# Patient Record
Sex: Male | Born: 1995 | Race: Black or African American | Hispanic: No | Marital: Single | Smoking: Current every day smoker
Health system: Southern US, Community
[De-identification: ages and names within clinical notes are randomized; demographics above are authoritative.]

## PROBLEM LIST (undated history)

## (undated) ENCOUNTER — Ambulatory Visit: Admission: EM | Payer: MEDICAID

## (undated) DIAGNOSIS — L0291 Cutaneous abscess, unspecified: Secondary | ICD-10-CM

## (undated) DIAGNOSIS — F1290 Cannabis use, unspecified, uncomplicated: Secondary | ICD-10-CM

## (undated) DIAGNOSIS — R112 Nausea with vomiting, unspecified: Secondary | ICD-10-CM

## (undated) HISTORY — PX: TONSILLECTOMY: SUR1361

---
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
## 2013-07-23 ENCOUNTER — Emergency Department (INDEPENDENT_AMBULATORY_CARE_PROVIDER_SITE_OTHER)
Admission: EM | Admit: 2013-07-23 | Discharge: 2013-07-23 | Disposition: A | Discharge status: Home / Self Care | Payer: Medicaid Other | Source: Home / Self Care

## 2013-07-23 ENCOUNTER — Encounter (HOSPITAL_COMMUNITY): Payer: Self-pay | Admitting: Emergency Medicine

## 2013-07-23 DIAGNOSIS — N492 Inflammatory disorders of scrotum: Secondary | ICD-10-CM

## 2013-07-23 DIAGNOSIS — N498 Inflammatory disorders of other specified male genital organs: Secondary | ICD-10-CM

## 2013-07-23 MED ORDER — SULFAMETHOXAZOLE-TRIMETHOPRIM 800-160 MG PO TABS: 1.0000 | ORAL_TABLET | Freq: Two times a day (BID) | ORAL | Status: AC

## 2013-07-23 MED ORDER — HYDROCODONE-ACETAMINOPHEN 5-325 MG PO TABS: 1.0000 | ORAL_TABLET | ORAL | Status: DC | PRN

## 2013-07-23 NOTE — ED Notes (Signed)
Groin abscess, right side that patient noticed one week ago.

## 2013-07-23 NOTE — ED Provider Notes (Signed)
Medical screening examination/treatment/procedure(s) were performed by non-physician practitioner and as supervising physician I was immediately available for consultation/collaboration.  Adrielle Polakowski, M.D.  Werner Labella C Hallis Meditz, MD 07/23/13 2217 

## 2013-07-23 NOTE — ED Provider Notes (Signed)
CSN: [id number]     Arrival date & time 07/23/13  1244 History   First MD Initiated Contact with Patient 07/23/13 1540     Chief Complaint  Patient presents with  . Abscess   (Consider location/radiation/quality/duration/timing/severity/associated sxs/prior Treatment) HPI Comments: 17 year old male complaining of an abscess to the right groin for one week. States it is painful, tender and recurrent. He said similar abscess formation similar area in the remote past.   History reviewed. No pertinent past medical history. Past Surgical History  Procedure Laterality Date  . Tonsillectomy     History reviewed. No pertinent family history. History  Substance Use Topics  . Smoking status: Current Every Day Smoker  . Smokeless tobacco: Not on file  . Alcohol Use: No    Review of Systems  Constitutional: Positive for activity change. Negative for fever, chills and fatigue.  HENT: Negative.   Respiratory: Negative.   Gastrointestinal: Negative.   Genitourinary: Positive for scrotal swelling. Negative for dysuria, urgency, frequency, flank pain, discharge, penile swelling, difficulty urinating, penile pain and testicular pain.  Neurological: Negative.     Allergies  Review of patient's allergies indicates no known allergies.  Home Medications   Current Outpatient Rx  Name  Route  Sig  Dispense  Refill  . HYDROcodone-acetaminophen (NORCO/VICODIN) 5-325 MG per tablet   Oral   Take 1 tablet by mouth every 4 (four) hours as needed.   15 tablet   0   . sulfamethoxazole-trimethoprim (BACTRIM DS,SEPTRA DS) 800-160 MG per tablet   Oral   Take 1 tablet by mouth 2 (two) times daily.   14 tablet   0    There were no vitals taken for this visit. Physical Exam  Nursing note and vitals reviewed. Constitutional: He appears well-developed and well-nourished. No distress.  Neck: Normal range of motion. Neck supple.  Pulmonary/Chest: Effort normal. No respiratory distress.   Genitourinary:  There is a cystic, fluctuant, well marginated raised lesion over the right scrotum. It palpates similar to a water balloon. No surrounding induration or erythema. It does not extend into the remainder of the scrotum. There is no testicular enlargement or tenderness. There is no swelling within the scrotum. No pathology tenderness or pain of the penis.  Neurological: He is alert. He exhibits normal muscle tone.  Skin: Skin is warm and dry.  Psychiatric: He has a normal mood and affect.    ED Course  INCISION AND DRAINAGE Date/Time: 07/23/2013 4:50 PM Performed by: Phineas Real, Fonda Rochon Authorized by: Leslee Home C Consent: Verbal consent obtained. Risks and benefits: risks, benefits and alternatives were discussed Consent given by: patient Patient understanding: patient states understanding of the procedure being performed Patient identity confirmed: verbally with patient Type: abscess Body area: anogenital Location details: scrotal wall Anesthesia: local infiltration Local anesthetic: lidocaine 2% with epinephrine Anesthetic total: 10 ml Patient sedated: no Scalpel size: 11 Incision type: single straight Complexity: complex Drainage: purulent Drainage amount: copious Wound treatment: drain placed Packing material: 1/4 in gauze Patient tolerance: Patient tolerated the procedure well with no immediate complications. Comments: Covered with 4 x 4's dressing. The lesion was hollow/cavernous and no marsupialization required.    (including critical care time) Labs Review Labs Reviewed  CULTURE, ROUTINE-ABSCESS   Imaging Review No results found.    MDM   1. Abscess of scrotum      IND the infected cyst Norco for pain #15 Septra DS twice a day for 7 days Followup in 2 days for packing removal and wound  check Return  for new symptoms or problems  Hayden Rasmussen, NP 07/23/13 [phone]

## 2013-07-26 ENCOUNTER — Telehealth (HOSPITAL_COMMUNITY): Payer: Self-pay | Admitting: *Deleted

## 2013-07-26 ENCOUNTER — Encounter (HOSPITAL_COMMUNITY): Payer: Self-pay | Admitting: Emergency Medicine

## 2013-07-26 ENCOUNTER — Emergency Department (INDEPENDENT_AMBULATORY_CARE_PROVIDER_SITE_OTHER)
Admission: EM | Admit: 2013-07-26 | Discharge: 2013-07-26 | Disposition: A | Discharge status: Home / Self Care | Payer: Medicaid Other | Source: Home / Self Care | Attending: Emergency Medicine | Admitting: Emergency Medicine

## 2013-07-26 DIAGNOSIS — L732 Hidradenitis suppurativa: Secondary | ICD-10-CM

## 2013-07-26 HISTORY — DX: Cutaneous abscess, unspecified: L02.91

## 2013-07-26 LAB — CULTURE, ROUTINE-ABSCESS

## 2013-07-26 MED ORDER — CLINDAMYCIN PHOSPHATE 1 % EX SOLN: Freq: Two times a day (BID) | CUTANEOUS | Status: DC

## 2013-07-26 MED ORDER — CEPHALEXIN 500 MG PO CAPS: 500.0000 mg | ORAL_CAPSULE | Freq: Three times a day (TID) | ORAL | Status: DC

## 2013-07-26 MED ORDER — MUPIROCIN 2 % EX OINT: 1.0000 "application " | TOPICAL_OINTMENT | Freq: Three times a day (TID) | CUTANEOUS | Status: DC

## 2013-07-26 NOTE — ED Notes (Signed)
Follow up boil, seen 12/26 for this .

## 2013-07-27 NOTE — ED Provider Notes (Addendum)
Chief Complaint:   Chief Complaint  Patient presents with  . Wound Check    History of Present Illness:    Jose Mays is a 17 year old male who returns today for a recheck on an abscess in his groin and packing removal. This was incised and drained 2 days ago. A culture was taken, but at the time of this visit the culture was not back yet. The patient states the abscess feels better although it still somewhat painful. He has multiple other tender nodules in his groin area. He denies any nodules in his axillas. He states he's had recurring boils in the pubic area and on the scrotum for years.  Review of Systems:  Other than noted above, the patient denies any of the following symptoms: Systemic:  No fever, chills or sweats. Skin:  No rash or itching.  PMFSH:  Past medical history, family history, social history, meds, and allergies were reviewed.  No history of diabetes or prior history of abscesses or MRSA.  Physical Exam:   Vital signs:  BP 101/47  Pulse 69  Temp(Src) 97.6 F (36.4 C) (Oral)  Resp 16  SpO2 100% Skin:  There is abscess in the scrotum. This is been packed there still some surrounding induration.  He has multiple, smaller nodules in the pubic area. None of these were fluctuant or draining. Skin exam was otherwise normal.  No rash. Ext:  Distal pulses were full, patient has full ROM of all joints.  Procedure:  Verbal informed consent was obtained.  The patient was informed of the risks and benefits of the procedure and understands and accepts.  Identity of the patient was verified verbally and by wristband. The packing was removed and a small amount of pus was expressed. The area was washed with saline, antibiotic ointment was applied, and a clean, dry dressing.  Assessment:  The encounter diagnosis was Hidradenitis suppurativa.  He appears to have hidradenitis suppurativa. Will need followup with a dermatologist.  Plan:   1.  Meds:  The following meds were  prescribed:   Discharge Medication List as of 07/26/2013  8:55 AM    START taking these medications   Details  clindamycin (CLEOCIN-T) 1 % external solution Apply topically 2 (two) times daily., Starting 07/26/2013, Until Discontinued, Normal    mupirocin ointment (BACTROBAN) 2 % Apply 1 application topically 3 (three) times daily., Starting 07/26/2013, Until Discontinued, Normal        2.  Patient Education/Counseling:  The patient was given appropriate handouts, self care instructions, and instructed in symptomatic relief.  Suggested twice-weekly Clorox baths and application mupirocin ointment to the nostrils.  3.  Follow up:  The patient was instructed to leave the dressing in place and return again in 48 hours for packing removal, if becoming worse in any way, and given some red flag symptoms such as increasing pain or swelling of any other nodules which would prompt immediate return.  Follow up here for I&D of any future nodules and with Dr. Para Skeans for routine followup on his hidradenitis suppurativa.  Addendum: Later on that afternoon the culture came back showing a few strep group B. The patient was called and informed of this. He was told not to take any further of the Septra, and a prescription was sent in for cephalexin 500 mg twice a day for 10 days.     Reuben Likes, MD 07/27/13 [phone]  Addendum: The culture showed group C strep, not group B.  Reuben Likes,  MD 07/27/13 1447

## 2013-07-27 NOTE — ED Notes (Addendum)
12/29  Abscess culture R scrotum: Rare Group C strep-no sensitivity.  Treated with Bactrim DS.  Discussed with Dr. Lorenz Coaster. He said he was going to change him to Keflex.  I called pt. and left a message to call.  Call 1. 12/30 I called and left message with pt.'s mother for him to call.  Call 2. Jose Mays 07/27/2013  Pt. called back.  Pt. verified x 2 and given results.  Pt. told to stop the Bactrim DS and take all of the Keflex. Pt. told where to pick up Rx. Pt. instructed to come back if not better after finishing the antibiotics or worsening in anyway. Pt. voiced understanding.

## 2014-03-10 ENCOUNTER — Emergency Department: Payer: Self-pay | Admitting: Emergency Medicine

## 2014-03-17 ENCOUNTER — Emergency Department: Payer: Self-pay | Admitting: Emergency Medicine

## 2014-03-17 LAB — COMPREHENSIVE METABOLIC PANEL
ALBUMIN: 4.4 g/dL (ref 3.8–5.6)
ALT: 18 U/L
Alkaline Phosphatase: 83 U/L
Anion Gap: 13 (ref 7–16)
BUN: 14 mg/dL (ref 7–18)
Bilirubin,Total: 0.6 mg/dL (ref 0.2–1.0)
CALCIUM: 8.9 mg/dL — AB (ref 9.0–10.7)
CO2: 25 mmol/L (ref 21–32)
Chloride: 104 mmol/L (ref 98–107)
Creatinine: 1.4 mg/dL — ABNORMAL HIGH (ref 0.60–1.30)
GLUCOSE: 171 mg/dL — AB (ref 65–99)
OSMOLALITY: 288 (ref 275–301)
Potassium: 3 mmol/L — ABNORMAL LOW (ref 3.5–5.1)
SGOT(AST): 19 U/L (ref 10–41)
SODIUM: 142 mmol/L (ref 136–145)
Total Protein: 8 g/dL (ref 6.4–8.6)

## 2014-03-17 LAB — CBC
HCT: 46.4 % (ref 40.0–52.0)
HGB: 15.7 g/dL (ref 13.0–18.0)
MCH: 32 pg (ref 26.0–34.0)
MCHC: 33.9 g/dL (ref 32.0–36.0)
MCV: 94 fL (ref 80–100)
PLATELETS: 339 10*3/uL (ref 150–440)
RBC: 4.91 10*6/uL (ref 4.40–5.90)
RDW: 12.7 % (ref 11.5–14.5)
WBC: 9.5 10*3/uL (ref 3.8–10.6)

## 2014-03-17 LAB — LIPASE, BLOOD: LIPASE: 86 U/L (ref 73–393)

## 2014-06-28 ENCOUNTER — Emergency Department: Payer: Self-pay | Admitting: Emergency Medicine

## 2014-10-04 ENCOUNTER — Emergency Department: Payer: Self-pay | Admitting: Emergency Medicine

## 2014-10-13 ENCOUNTER — Emergency Department: Payer: Self-pay | Admitting: Emergency Medicine

## 2014-10-14 ENCOUNTER — Inpatient Hospital Stay: Payer: Self-pay | Admitting: Psychiatry
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
## 2014-11-27 NOTE — H&P (Signed)
PATIENT NAME:  Jose Mays, Jose Mays MR#:  [id number] DATE OF BIRTH:  28-Feb-1996  DATE OF ADMISSION:  10/13/2014  IDENTIFYING INFORMATION: The patient is a 19 year old African American male with no known past history who presented to the Emergency Room by the law enforcement due to agitation and paranoia in the parking lot.   CHIEF COMPLAINT: "I was hearing things."   HISTORY OF PRESENT ILLNESS:   The patient is a 19 year old African American male who was evaluated for paranoia. Most of the information was obtained from the patient, as well as from the chart. During the interview, the patient reported that he came to the Emergency Room as his stomach was hurting. He reported that he was unable to sleep and was walking around  for the whole night. He went to sleep around 6:30 in the morning and then he went to work. He stated that his head was hurting as well. After that he ate some potato and he started throwing up. He went to his grandmother's house and he threw up again. He stated that his grandma asked him to come to the hospital as he was not feeling. When he presented to the ER, he could not understand what was going on and what he was saying. He smokes marijuana  before coming to the hospital. The patient reported that he was having auditory and visual hallucinations at this time and was unable to provide a coherent history. The patient was admitted for paranoia. He stated that he also has a court date coming up which he did not provide information to the ER physician. The patient reported that he has been having auditory hallucinations at the time of admission but is not experiencing the same at this time. He is responding well to the medication. The patient currently denied having any suicidal ideations or plans.   PAST PSYCHIATRIC HISTORY: The patient reported that he has seen some kind of mental health provider when he was young but is not taking any current psychotropic medication. He does not  have  any history of prior psychiatric admission or suicide attempts.   SOCIAL HISTORY: The patient currently lives with his mother and older brother. He works as a Public affairs consultantdishwasher. He stated that he is on probation for marijuana  possession. He denied having any  children and  has  never been married in the past.   PAST MEDICAL HISTORY: The patient reported that he has boils in his which might be related to his marijuana use.   SUBSTANCE ABUSE HISTORY: The patient uses marijuana on a daily basis for the past 10 years. He reported that he is trying to quit the use of the same. He denied using any alcohol.   FAMILY HISTORY: He denied any history of psychiatric illness in his family.   CURRENT MEDICATIONS: He reported that he uses Percocet off and on for the boils in his groin.   ALLERGIES: No known drug allergies.   REVIEW OF SYSTEMS:    CONSTITUTIONAL:   The patient denies any fever or chills. No weight changes.  EYES: No double or blurred vision.  RESPIRATORY:   No shortness of breath or cough.  CARDIOVASCULAR: No chest pain or orthopnea.  GASTROINTESTINAL: No abdominal pain, nausea, vomiting or diarrhea.  GENITOURINARY: No incontinence or frequency.  ENDOCRINE: No heat or cold intolerance.  LYMPHATIC: No anemia or easy bruising.  INTEGUMENTARY: No skin  rash.  MUSCULOSKELETAL: No muscle or joint pain.  NEUROLOGIC: No tingling or weakness.  VITAL SIGNS: Temperature  98.3, pulse 81, respirations 18, blood pressure 130/77.   LABORATORY:  Blood glucose 117, BUN 14, creatinine 1.02, sodium 139, potassium 3.7, chloride 102, bicarbonate 30, anion gap 7, calcium 9.3, protein 8.2, albumin 4.4, bilirubin 0.3,AST 36, ALT 17. UDS is positive for cannabinoids. WBC 10.8, RBC 4.56, hemoglobin 14.3, hematocrit 43.9, platelet count 426,000, MCV is 96. RDW is 12.2.   MENTAL STATUS EXAMINATION: The patient is a moderately built male who appeared his stated age. He was calm and cooperative. He maintained fair eye  contact. His speech was low in tone and volume. Mood was anxious. Affect was congruent. Thought process was logical, goal-directed. Thought content was nondelusional.   He currently denied having any auditory or visual hallucinations. He denied having any suicidal ideations or plans. His memory seems appropriate and his insight and judgment were fair. Fund of knowledge seems normal.   DIAGNOSTIC IMPRESSION: AXIS I:    1.  Psychotic disorder, not otherwise specified.                    2.  Cannabis use disorder, moderate.  AXIS II:  None.  AXIS III: None reported.   TREATMENT PLAN:   1. The patient will be admitted to the inpatient behavioral health unit for stabilization and safety.  2. He was started on Risperdal  1 mg p.o. b.i.d.  He is also getting Keflex 500 mg every 6 hours for 10 days.    3. He will be monitored  closely by the staff.   Thank you for allowing me to participate in the care of this patient.    ____________________________ Ardeen Fillers. Garnetta Buddy, MD usf:tr D: 10/15/2014 12:06:15 ET T: 10/15/2014 12:27:31 ET JOB#: [id number]  cc: Ardeen Fillers. Garnetta Buddy, MD, <Dictator> Rhunette Croft MD ELECTRONICALLY SIGNED 10/16/2014 12:10

## 2014-12-05 ENCOUNTER — Emergency Department (INDEPENDENT_AMBULATORY_CARE_PROVIDER_SITE_OTHER)
Admission: EM | Admit: 2014-12-05 | Discharge: 2014-12-05 | Disposition: A | Discharge status: Home / Self Care | Payer: Self-pay | Source: Home / Self Care | Attending: Family Medicine | Admitting: Family Medicine

## 2014-12-05 ENCOUNTER — Encounter (HOSPITAL_COMMUNITY): Payer: Self-pay | Admitting: Emergency Medicine

## 2014-12-05 DIAGNOSIS — L739 Follicular disorder, unspecified: Secondary | ICD-10-CM

## 2014-12-05 DIAGNOSIS — A630 Anogenital (venereal) warts: Secondary | ICD-10-CM

## 2014-12-05 MED ORDER — PODOFILOX 0.5 % EX GEL: CUTANEOUS | Status: DC

## 2014-12-05 MED ORDER — DOXYCYCLINE HYCLATE 100 MG PO CAPS: 100.0000 mg | ORAL_CAPSULE | Freq: Two times a day (BID) | ORAL | Status: DC

## 2014-12-05 NOTE — ED Provider Notes (Signed)
CSN: [id number]     Arrival date & time 12/05/14  1530 History   First MD Initiated Contact with Patient 12/05/14 1621     Chief Complaint  Patient presents with  . Genital Warts   (Consider location/radiation/quality/duration/timing/severity/associated sxs/prior Treatment) HPI Comments: 19 year old male complains of having papular, flesh-colored lesions to the penis as well as the inner thighs and pubic area. The lesions to the penis of been there for about 3 weeks. Other lesions are recurrent over the past several months. He has been to a physician in the past who diagnosed him with having folliculitis and associated abscesses.   Past Medical History  Diagnosis Date  . Abscess    Past Surgical History  Procedure Laterality Date  . Tonsillectomy     History reviewed. No pertinent family history. History  Substance Use Topics  . Smoking status: Current Every Day Smoker  . Smokeless tobacco: Not on file  . Alcohol Use: No    Review of Systems  Constitutional: Negative.   Genitourinary: Positive for genital sores. Negative for dysuria, urgency, hematuria, flank pain, discharge, penile swelling, scrotal swelling, penile pain and testicular pain.  Skin: Positive for rash.  All other systems reviewed and are negative.   Allergies  Review of patient's allergies indicates no known allergies.  Home Medications   Prior to Admission medications   Medication Sig Start Date End Date Taking? Authorizing Provider  cephALEXin (KEFLEX) 500 MG capsule Take 1 capsule (500 mg total) by mouth 3 (three) times daily. 07/26/13   Reuben Likesavid C Keller, MD  clindamycin (CLEOCIN-T) 1 % external solution Apply topically 2 (two) times daily. 07/26/13   Reuben Likesavid C Keller, MD  doxycycline (VIBRAMYCIN) 100 MG capsule Take 1 capsule (100 mg total) by mouth 2 (two) times daily. 12/05/14   Hayden Rasmussenavid Jowana Thumma, NP  HYDROcodone-acetaminophen (NORCO/VICODIN) 5-325 MG per tablet Take 1 tablet by mouth every 4 (four) hours as  needed. 07/23/13   Hayden Rasmussenavid Russ Looper, NP  mupirocin ointment (BACTROBAN) 2 % Apply 1 application topically 3 (three) times daily. 07/26/13   Reuben Likesavid C Keller, MD  podofilox (CONDYLOX) 0.5 % gel Apply a small amount topically to bumps twice a day for 3 days. Wait 4 days, then reapply for 3 more days. May repeat cycle 4 times. 12/05/14   Hayden Rasmussenavid Valary Manahan, NP   BP 119/73 mmHg  Pulse 79  Temp(Src) 98.4 F (36.9 C) (Oral)  Resp 16  SpO2 98% Physical Exam  Constitutional: He is oriented to person, place, and time. He appears well-developed and well-nourished. No distress.  Pulmonary/Chest: Effort normal. No respiratory distress.  Genitourinary:  Normal external male genitalia. There are approximately 8-9 small 1-2 mm raised flesh-colored, painless lesions to the penis. There is no drainage. No formation of vesicles. No tenderness. Her other lesions to the hairy areas of the supra-pubis and medial thighs. These lesions are pustules consistent with his history of folliculitis.  Musculoskeletal: Normal range of motion.  Neurological: He is alert and oriented to person, place, and time. He exhibits normal muscle tone.  Skin: Skin is warm and dry.  Psychiatric: He has a normal mood and affect.  Nursing note and vitals reviewed.   ED Course  Procedures (including critical care time) Labs Review Labs Reviewed - No data to display  Imaging Review No results found.   MDM   1. Folliculitis   2. Genital warts    Podofilox treatment as directed. Instructions on use given. Doxycycline as directed for folliculitis Follow-up with your PCP. May  need to follow-up with dermatology.    Hayden Rasmussenavid Keeton Kassebaum, NP 12/05/14 [phone]

## 2014-12-05 NOTE — Discharge Instructions (Signed)
Folliculitis  Folliculitis is redness, soreness, and swelling (inflammation) of the hair follicles. This condition can occur anywhere on the body. People with weakened immune systems, diabetes, or obesity have a greater risk of getting folliculitis. CAUSES  Bacterial infection. This is the most common cause.  Fungal infection.  Viral infection.  Contact with certain chemicals, especially oils and tars. Long-term folliculitis can result from bacteria that live in the nostrils. The bacteria may trigger multiple outbreaks of folliculitis over time. SYMPTOMS Folliculitis most commonly occurs on the scalp, thighs, legs, back, buttocks, and areas where hair is shaved frequently. An early sign of folliculitis is a small, white or yellow, pus-filled, itchy lesion (pustule). These lesions appear on a red, inflamed follicle. They are usually less than 0.2 inches (5 mm) wide. When there is an infection of the follicle that goes deeper, it becomes a boil or furuncle. A group of closely packed boils creates a larger lesion (carbuncle). Carbuncles tend to occur in hairy, sweaty areas of the body. DIAGNOSIS  Your caregiver can usually tell what is wrong by doing a physical exam. A sample may be taken from one of the lesions and tested in a lab. This can help determine what is causing your folliculitis. TREATMENT  Treatment may include:  Applying warm compresses to the affected areas.  Taking antibiotic medicines orally or applying them to the skin.  Draining the lesions if they contain a large amount of pus or fluid.  Laser hair removal for cases of long-lasting folliculitis. This helps to prevent regrowth of the hair. HOME CARE INSTRUCTIONS  Apply warm compresses to the affected areas as directed by your caregiver.  If antibiotics are prescribed, take them as directed. Finish them even if you start to feel better.  You may take over-the-counter medicines to relieve itching.  Do not shave  irritated skin.  Follow up with your caregiver as directed. SEEK IMMEDIATE MEDICAL CARE IF:   You have increasing redness, swelling, or pain in the affected area.  You have a fever. MAKE SURE YOU:  Understand these instructions.  Will watch your condition.  Will get help right away if you are not doing well or get worse. Document Released: 09/23/2001 Document Revised: 01/14/2012 Document Reviewed: 10/15/2011 [facility] Patient Information 2015 [place], [place]. This information is not intended to replace advice given to you by your health care provider. Make sure you discuss any questions you have with your health care provider.  Genital Warts Genital warts are a sexually transmitted infection. They may appear as small bumps on the tissues of the genital area. CAUSES  Genital warts are caused by a virus called human papillomavirus (HPV). HPV is the most common sexually transmitted disease (STD) and infection of the sex organs. This infection is spread by having unprotected sex with an infected person. It can be spread by vaginal, anal, and oral sex. Many people do not know they are infected. They may be infected for years without problems. However, even if they do not have problems, they can unknowingly pass the infection to their sexual partners. SYMPTOMS   Itching and irritation in the genital area.  Warts that bleed.  Painful sexual intercourse. DIAGNOSIS  Warts are usually recognized with the naked eye on the vagina, vulva, perineum, anus, and rectum. Certain tests can also diagnose genital warts, such as:  A Pap test.  A tissue sample (biopsy) exam.  Colposcopy. A magnifying tool is used to examine the vagina and cervix. The HPV cells will change color  when certain solutions are used. TREATMENT  Warts can be removed by:  Applying certain chemicals, such as cantharidin or podophyllin.  Liquid nitrogen freezing (cryotherapy).  Immunotherapy with Candida or Trichophyton  injections.  Laser treatment.  Burning with an electrified probe (electrocautery).  Interferon injections.  Surgery. PREVENTION  HPV vaccination can help prevent HPV infections that cause genital warts and that cause cancer of the cervix. It is recommended that the vaccination be given to people between the ages 589 to 122 years old. The vaccine might not work as well or might not work at all if you already have HPV. It should not be given to pregnant women. HOME CARE INSTRUCTIONS   It is important to follow your caregiver's instructions. The warts will not go away without treatment. Repeat treatments are often needed to get rid of warts. Even after it appears that the warts are gone, the normal tissue underneath often remains infected.  Do not try to treat genital warts with medicine used to treat hand warts. This type of medicine is strong and can burn the skin in the genital area, causing more damage.  Tell your past and current sexual partner(s) that you have genital warts. They may be infected also and need treatment.  Avoid sexual contact while being treated.  Do not touch or scratch the warts. The infection may spread to other parts of your body.  Women with genital warts should have a cervical cancer check (Pap test) at least once a year. This type of cancer is slow-growing and can be cured if found early. Chances of developing cervical cancer are increased with HPV.  Inform your obstetrician about your warts in the event of pregnancy. This virus can be passed to the baby's respiratory tract. Discuss this with your caregiver.  Use a condom during sexual intercourse. Following treatment, the use of condoms will help prevent reinfection.  Ask your caregiver about using over-the-counter anti-itch creams. SEEK MEDICAL CARE IF:   Your treated skin becomes red, swollen, or painful.  You have a fever.  You feel generally ill.  You feel little lumps in and around your genital  area.  You are bleeding or have painful sexual intercourse. MAKE SURE YOU:   Understand these instructions.  Will watch your condition.  Will get help right away if you are not doing well or get worse. Document Released: 07/12/2000 Document Revised: 11/29/2013 Document Reviewed: 01/21/2011 [facility]Care Patient Information 2015 [place], [place]. This information is not intended to replace advice given to you by your health care provider. Make sure you discuss any questions you have with your health care provider.

## 2014-12-05 NOTE — ED Notes (Signed)
C/o genital warts that comes and goes No tx tried Notice warts three weeks ago

## 2014-12-06 NOTE — H&P (Signed)
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 COMPLAINT: "sick" OF PRESENT ILLNESS: Information from the patient and the chart. In interview today, the patient is not very forthcoming, but he says that he presented himself to the ED after experiencing vomiting , diarrhea and headache. He admits that at that time he was hallucinating and felt like people could not see or hear him. He says that he tried to leave the ED to go to the store and when he came back "everyone was mad at him" He denies that he had been having any mood problems. Denies having suicidal or homicidal ideation. The patient is not very forthcoming with other symptoms,  He denies that he has been using any drugs different than usual, although he admits that he is a heavy regular user of marijuana, and would like help to stop using. Denies that he had been drinking. He mentions some stresses including being on probation, probation violation and upcoming court date. 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 Patient admits to a dreamlike state when he was admitted. He currently reports sleeping and eating well and is in no physical pain. PSYCHIATRIC HISTORY: Family reports that he was [facility] [place] in [place] school following him being disruptive in class but no mental health issues were diagnosed at the time. Says he has not seen anybody for mental health treatment as an adult.Admits to suicidal thoughts as a child but denies any attempts. HISTORY: The patient lives with his mother and older brother. 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 He has a stable relationship with his adoptive family but also has contact with biological parents that is conflicted. The patient reports that he has a GED and desireds further education. He also has a  girlfriend of 3-5 years that he is close to.  MEDICAL HISTORY: He tells me that he has boils in his groin, which have been chronic and come and go. He does not give me much other detail about that.He has been given small amounts of narcotics, intermittently, most recently about 7 days ago. He says he may have had surgery for it in the past.. Does not have any other known medical problems.  ABUSE HISTORY: Says that he has been using marijuana daily for years. Claims that he used to actually use more than he is doing now. He denies that he uses any other drugs and denies that he has been using any designer drugs or any other street drugs recently. Says he does not usually drink regularly. Has not been drinking recently.  HISTORY: Family reports that his grandfather was diagnosed with bipolar and his biological father has mental health and substance abuse issues. MEDICATIONS: Antibiotics.  No known drug allergies.  OF SYSTEMS: The patient really has no positive reports now. Denies pain. Denies fever or chills. He denies feeling depressed. Denies having hallucinations now. STATUS EXAMINATION: Reasonably well-groomed young man who looks in good health, looks his stated age. The patient was very quiet at first but became more animated as the interview progressed. Speech is quiet, whispered and muttered. Affect flat. Cooperation passive. Eye contact minimal. Mood stated as being relaxed. Thoughts are disorganized, slow, thought blocking present. Denies current hallucinations.  Judgment and insight are impaired. Intelligence, presumably normal at baseline, normal  fund of knowledge.  RESULTS: Drug screen is positive for cannabis only. Chemistry showed an elevated glucose 166, elevated creatinine 1.38, low chloride 3.4. Alcohol level negative. CBC slightly elevated white count at 10.8, otherwise unremarkable. Urinalysis 2+ leukocyte esterase, some white cells.  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  Folliculitis A 19 year old man with a history of heavy marijuana use who presents with what sounds like an acute or subacute psychosis.The patient is no longer agitated, but he continues to be impaired in his thinking, very passive, very flat. Needs further evaluation and consideration for treatment.  PLAN: will start risperdal 1 mg po bidwill order TSH, B12, RPR, HIVwill order head CT      Electronic Signatures: Jimmy FootmanHernandez-Gonzalez, Pope Brunty (MD)  (Signed on 18-Mar-16 13:08)  Authored  Last Updated: 18-Mar-16 13:08 by Jimmy FootmanHernandez-Gonzalez, Peaches Vanoverbeke (MD)

## 2015-11-22 ENCOUNTER — Encounter: Payer: Self-pay | Admitting: Medical Oncology

## 2015-11-22 ENCOUNTER — Emergency Department
Admission: EM | Admit: 2015-11-22 | Discharge: 2015-11-22 | Disposition: A | Discharge status: Home / Self Care | Payer: Medicaid Other | Attending: Emergency Medicine | Admitting: Emergency Medicine

## 2015-11-22 ENCOUNTER — Telehealth: Payer: Self-pay

## 2015-11-22 DIAGNOSIS — N492 Inflammatory disorders of scrotum: Secondary | ICD-10-CM | POA: Insufficient documentation

## 2015-11-22 DIAGNOSIS — F17200 Nicotine dependence, unspecified, uncomplicated: Secondary | ICD-10-CM | POA: Insufficient documentation

## 2015-11-22 MED ORDER — LIDOCAINE HCL (PF) 1 % IJ SOLN
5.0000 mL | Freq: Once | INTRAMUSCULAR | Status: AC
  Administered 2015-11-22: 5 mL via INTRADERMAL

## 2015-11-22 MED ORDER — CEPHALEXIN 500 MG PO CAPS: 500.0000 mg | ORAL_CAPSULE | Freq: Four times a day (QID) | ORAL | Status: DC

## 2015-11-22 MED ORDER — OXYCODONE-ACETAMINOPHEN 5-325 MG PO TABS: 1.0000 | ORAL_TABLET | ORAL | Status: DC | PRN

## 2015-11-22 MED ORDER — OXYCODONE-ACETAMINOPHEN 5-325 MG PO TABS
2.0000 | ORAL_TABLET | Freq: Once | ORAL | Status: AC
  Administered 2015-11-22: 2 via ORAL
  Filled 2015-11-22: qty 2

## 2015-11-22 MED ORDER — ONDANSETRON 4 MG PO TBDP
4.0000 mg | ORAL_TABLET | Freq: Once | ORAL | Status: AC
  Administered 2015-11-22: 4 mg via ORAL
  Filled 2015-11-22: qty 1

## 2015-11-22 MED ORDER — DIAZEPAM 2 MG PO TABS
2.0000 mg | ORAL_TABLET | Freq: Once | ORAL | Status: AC
  Administered 2015-11-22: 2 mg via ORAL
  Filled 2015-11-22: qty 1

## 2015-11-22 MED ORDER — LIDOCAINE HCL (PF) 1 % IJ SOLN
INTRAMUSCULAR | Status: AC
  Administered 2015-11-22: 10:00:00 5 mL via INTRADERMAL
  Filled 2015-11-22: qty 5

## 2015-11-22 NOTE — ED Notes (Signed)
States he developed a possible abscess area to right side of scrotum about 4-5 days ago

## 2015-11-22 NOTE — Consult Note (Signed)
 @ 11:28 AM   Jose Mays 03-09-1996 [id number]  Referring provider: Dr. Edwena Bunde  Chief Complaint  Patient presents with  . Abscess    HPI: The patient is a 20 year old male who presents with a scrotal abscess. He first noticed an approximate 6 days ago and felt that resolved. However, he noted a new abscess 2 days ago and has gotten progressively more painful. He has not drained any fluid. He has had no fevers or chills. He has had abscesses drained in the past in his scrotum. He has no history of diabetes and is otherwise healthy.     PMH: Past Medical History  Diagnosis Date  . Abscess     Surgical History: Past Surgical History  Procedure Laterality Date  . Tonsillectomy      Home Medications:    Medication List    ASK your doctor about these medications        cephALEXin 500 MG capsule  Commonly known as:  KEFLEX  Take 1 capsule (500 mg total) by mouth 3 (three) times daily.     clindamycin 1 % external solution  Commonly known as:  CLEOCIN-T  Apply topically 2 (two) times daily.     doxycycline 100 MG capsule  Commonly known as:  VIBRAMYCIN  Take 1 capsule (100 mg total) by mouth 2 (two) times daily.     HYDROcodone-acetaminophen 5-325 MG tablet  Commonly known as:  NORCO/VICODIN  Take 1 tablet by mouth every 4 (four) hours as needed.     mupirocin ointment 2 %  Commonly known as:  BACTROBAN  Apply 1 application topically 3 (three) times daily.     podofilox 0.5 % gel  Commonly known as:  CONDYLOX  Apply a small amount topically to bumps twice a day for 3 days. Wait 4 days, then reapply for 3 more days. May repeat cycle 4 times.        Allergies: No Known Allergies  Family History: No family history on file.  Social History:  reports that he has been smoking.  He does not have any smokeless tobacco history on file. He reports that he does not drink alcohol or use illicit drugs.  ROS: 12 point ROS negative except per HPI                                           Physical Exam: Ht  (1.676 m)  Wt 190 lb (86.183 kg)  BMI 30.68 kg/m2  Constitutional:  Alert and oriented, No acute distress. HEENT: Jose Mays, moist mucus membranes.  Trachea midline, no masses. Cardiovascular: No clubbing, cyanosis, or edema. Respiratory: Normal respiratory effort, no increased work of breathing. GI: Abdomen is soft, nontender, nondistended, no abdominal masses GU: No CVA tenderness. Normal phallus. Right hemiscrotum with 2 separate abscesses. The more superior one is approximately 2 cm in size. The more inferior was partially 4 cm in size. It is not clear if they're connected. No crepitus. No sign of Fournier's gangrene. Skin: No rashes, bruises or suspicious lesions. Lymph: No cervical or inguinal adenopathy. Neurologic: Grossly intact, no focal deficits, moving all 4 extremities. Psychiatric: Normal mood and affect.  Laboratory Data: Lab Results  Component Value Date   WBC 9.5 03/17/2014   HGB 15.7 03/17/2014   HCT 46.4 03/17/2014   MCV 94 03/17/2014   PLT 339 03/17/2014    Lab Results  Component Value Date   CREATININE 1.40* 03/17/2014    No results found for: PSA  No results found for: TESTOSTERONE  No results found for: HGBA1C  Urinalysis No results found for: COLORURINE, APPEARANCEUR, LABSPEC, PHURINE, GLUCOSEU, HGBUR, BILIRUBINUR, KETONESUR, PROTEINUR, UROBILINOGEN, NITRITE, LEUKOCYTESUR  Procedure: The patient's right hemiscrotum was prepped and draped in usual sterile fashion. The overlying skin was anesthetized with 1% lidocaine without epinephrine. A 1.5 cm incision was then made with a #11 blade over both of the abscesses in the right scrotum. Purulent drainage was expressed from both incisions to drain the abscess. Each incision was then explored with a hemostat to break up any loculations. Each incision was then packed with iodoform.   Assessment & Plan:  1. Scrotal abscess  2 The patient was instructed on how to pack his incisions of the scrotum. He has done this in the past. He was instructed to this Mays least once per day until he is no longer able to. He'll be sent home on antibiotics and pain medications. He will follow-up in our office in one week for wound check.  Hildred LaserBrian James Cricket Goodlin, MD  [facility] [address], [address] [place], [place] [address] [phone]

## 2015-11-22 NOTE — Telephone Encounter (Signed)
-----   Message from Hildred LaserBrian James Budzyn, MD sent at 11/22/2015 11:33 AM EDT ----- Patient needs to f/u late next week for wound check s/p i and d of scrotal abscess. This can be with Carollee HerterShannon if she has time.

## 2015-11-22 NOTE — ED Notes (Signed)
Pt reports abscess to groin.

## 2015-11-22 NOTE — ED Provider Notes (Signed)
[facility] Emergency Department Provider Note  ____________________________________________  Time seen: Approximately 10:00 AM  I have reviewed the triage vital signs and the nursing notes.   HISTORY  Chief Complaint Abscess    HPI Jose Mays is a 20 y.o. male who presents with a 4-5 days history of scrotal abscesses. Patient states his vision was started on the right side of the scrotum is moved over to the left. Denies any difficulty urinating but does have difficulty with prolonged standing or walking. Describes pain as 10 over 10 and relieved with ibuprofen.   Past Medical History  Diagnosis Date  . Abscess     There are no active problems to display for this patient.   Past Surgical History  Procedure Laterality Date  . Tonsillectomy      Current Outpatient Rx  Name  Route  Sig  Dispense  Refill  . cephALEXin (KEFLEX) 500 MG capsule   Oral   Take 1 capsule (500 mg total) by mouth 4 (four) times daily.   40 capsule   0   . oxyCODONE-acetaminophen (ROXICET) 5-325 MG tablet   Oral   Take 1-2 tablets by mouth every 4 (four) hours as needed for severe pain.   20 tablet   0     Allergies Review of patient's allergies indicates no known allergies.  No family history on file.  Social History Social History  Substance Use Topics  . Smoking status: Current Every Day Smoker  . Smokeless tobacco: None  . Alcohol Use: No    Review of Systems Constitutional: No fever/chills Genitourinary: Positive for scrotal abscess. Musculoskeletal: Negative for back pain. Skin: Negative for rash. Neurological: Negative for headaches, focal weakness or numbness.  10-point ROS otherwise negative.  ____________________________________________   PHYSICAL EXAM:  VITAL SIGNS: ED Triage Vitals  Enc Vitals Group     BP --      Pulse --      Resp --      Temp --      Temp src --      SpO2 --      Weight 11/22/15 0952 190 lb (86.183 kg)      Height 11/22/15 0952  (1.676 m)     Head Cir --      Peak Flow --      Pain Score 11/22/15 0952 10     Pain Loc --      Pain Edu? --      Excl. in GC? --     Constitutional: Alert and oriented. Well appearing and in no acute distress. Cardiovascular: Normal rate, regular rhythm. Grossly normal heart sounds.  Good peripheral circulation. Respiratory: Normal respiratory effort.  No retractions. Lungs CTAB. Gastrointestinal: Soft and nontender. Genitourinary scrotal with 3-4 cm firm abscess noted to the right testicle extending into the left. No lymphadenopathy noted. Musculoskeletal: No lower extremity tenderness nor edema.  No joint effusions. Neurologic:  Normal speech and language. No gross focal neurologic deficits are appreciated. No gait instability. Skin:  Skin is warm, dry and intact. No rash noted. Psychiatric: Mood and affect are normal. Speech and behavior are normal.  ____________________________________________   LABS (all labs ordered are listed, but only abnormal results are displayed)  Labs Reviewed - No data to display ____________________________________________    PROCEDURES  Procedure(s) performed: None  Critical Care performed: No  ____________________________________________   INITIAL IMPRESSION / ASSESSMENT AND PLAN / ED COURSE  Pertinent labs & imaging results that were available  during my care of the patient were reviewed by me and considered in my medical decision making (see chart for details).  Scrotal abscess. Discussed clinical findings with urology on call Dr. Apolinar JunesBrandon. She will come to the ED for evaluation and consultation. Urology present during the impact scrotum. Patient instructed on self packing techniques. History of doing the same. Rx discharged home with Keflex and Percocet. Follow up with urology as needed. ____________________________________________   FINAL CLINICAL IMPRESSION(S) / ED DIAGNOSES  Final diagnoses:   Scrotal abscess     This chart was dictated using voice recognition software/Dragon. Despite best efforts to proofread, errors can occur which can change the meaning. Any change was purely unintentional.   Evangeline Dakinharles M Seraphine Gudiel, PA-C 11/22/15 1156  Sharman CheekPhillip Stafford, MD 11/23/15 [phone]

## 2015-11-23 NOTE — Telephone Encounter (Signed)
LM for patient to cb to give him the appt for 11-30-15 @ 11:30 with Chauncy PassyShannon  Michelle

## 2015-11-30 ENCOUNTER — Ambulatory Visit: Payer: Self-pay | Admitting: Urology

## 2015-12-05 ENCOUNTER — Encounter: Payer: Self-pay | Admitting: Urology

## 2015-12-05 ENCOUNTER — Ambulatory Visit: Payer: Self-pay | Admitting: Urology

## 2016-04-02 ENCOUNTER — Emergency Department: Payer: Medicaid Other

## 2016-04-02 ENCOUNTER — Emergency Department
Admission: EM | Admit: 2016-04-02 | Discharge: 2016-04-02 | Disposition: A | Discharge status: Home / Self Care | Payer: Medicaid Other | Attending: Emergency Medicine | Admitting: Emergency Medicine

## 2016-04-02 ENCOUNTER — Encounter: Payer: Self-pay | Admitting: Medical Oncology

## 2016-04-02 DIAGNOSIS — F17200 Nicotine dependence, unspecified, uncomplicated: Secondary | ICD-10-CM | POA: Insufficient documentation

## 2016-04-02 DIAGNOSIS — L02214 Cutaneous abscess of groin: Secondary | ICD-10-CM | POA: Insufficient documentation

## 2016-04-02 LAB — BASIC METABOLIC PANEL
ANION GAP: 8 (ref 5–15)
BUN: 10 mg/dL (ref 6–20)
CO2: 28 mmol/L (ref 22–32)
Calcium: 9.1 mg/dL (ref 8.9–10.3)
Chloride: 101 mmol/L (ref 101–111)
Creatinine, Ser: 0.93 mg/dL (ref 0.61–1.24)
GFR calc non Af Amer: >60 mL/min (ref >60)
GLUCOSE: 87 mg/dL (ref 65–99)
POTASSIUM: 3.3 mmol/L — AB (ref 3.5–5.1)
Sodium: 137 mmol/L (ref 135–145)

## 2016-04-02 LAB — CBC WITH DIFFERENTIAL/PLATELET
Basophils Absolute: 0 10*3/uL (ref 0–0.1)
Basophils Relative: 0 %
Eosinophils Absolute: 0.1 10*3/uL (ref 0–0.7)
Eosinophils Relative: 1 %
HCT: 45.8 % (ref 40.0–52.0)
Hemoglobin: 15.7 g/dL (ref 13.0–18.0)
LYMPHS PCT: 10 %
Lymphs Abs: 1.4 10*3/uL (ref 1.0–3.6)
MCH: 32.2 pg (ref 26.0–34.0)
MCHC: 34.2 g/dL (ref 32.0–36.0)
MCV: 94.2 fL (ref 80.0–100.0)
MONO ABS: 1.3 10*3/uL — AB (ref 0.2–1.0)
Monocytes Relative: 10 %
Neutro Abs: 10.5 10*3/uL — ABNORMAL HIGH (ref 1.4–6.5)
Neutrophils Relative %: 79 %
Platelets: 313 10*3/uL (ref 150–440)
RBC: 4.86 MIL/uL (ref 4.40–5.90)
RDW: 12.5 % (ref 11.5–14.5)
WBC: 13.3 10*3/uL — ABNORMAL HIGH (ref 3.8–10.6)

## 2016-04-02 MED ORDER — CLINDAMYCIN PHOSPHATE 600 MG/50ML IV SOLN
600.0000 mg | Freq: Once | INTRAVENOUS | Status: AC
  Administered 2016-04-02: 600 mg via INTRAVENOUS
  Filled 2016-04-02: qty 50

## 2016-04-02 MED ORDER — HYDROCODONE-ACETAMINOPHEN 5-325 MG PO TABS: 1.0000 | ORAL_TABLET | Freq: Four times a day (QID) | ORAL | 0 refills | Status: DC | PRN

## 2016-04-02 MED ORDER — CLINDAMYCIN HCL 300 MG PO CAPS: 300.0000 mg | ORAL_CAPSULE | Freq: Three times a day (TID) | ORAL | 0 refills | Status: DC

## 2016-04-02 MED ORDER — LIDOCAINE HCL (PF) 1 % IJ SOLN
INTRAMUSCULAR | Status: AC
  Filled 2016-04-02: qty 10

## 2016-04-02 NOTE — Discharge Instructions (Signed)
Please follow up with Dr. Apolinar JunesBrandon in 2-3 days as discussed with her for wound recheck.

## 2016-04-02 NOTE — ED Notes (Signed)
"  boil" to right groin that appeared Sunday. Pt alert and oriented X4, active, cooperative, pt in NAD. RR even and unlabored, color WNL.

## 2016-04-02 NOTE — ED Provider Notes (Signed)
[facility] Emergency Department Provider Note  ____________________________________________  Time seen: Approximately 10:06 AM  I have reviewed the triage vital signs and the nursing notes.   HISTORY  Chief Complaint Abscess    HPI Jose Mays is a 20 y.o. male , NAD, presents to the emergency department with 2 day history of abscess to the groin. 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 States they normally start a small skin sores but then the swelling and pain increases significantly over the course of the next few days. This episode has started similarly. Has attempted to use an over-the-counter antibiotic spray which has not helped. Has not had any fevers, chills, body aches, abdominal pain, nausea, vomiting. Denies any urethral discharge, dysuria, hematuria no changes in urinary or bowel habits nor loss of bladder or bowel control. Denies any numbness, weakness, tingling. No history of diabetes.   Past Medical History:  Diagnosis Date  . Abscess     There are no active problems to display for this patient.   Past Surgical History:  Procedure Laterality Date  . TONSILLECTOMY      Prior to Admission medications   Medication Sig Start Date End Date Taking? Authorizing Provider  cephALEXin (KEFLEX) 500 MG capsule Take 1 capsule (500 mg total) by mouth 4 (four) times daily. 11/22/15   Charmayne Sheer Beers, PA-C  clindamycin (CLEOCIN) 300 MG capsule Take 1 capsule (300 mg total) by mouth 3 (three) times daily. 04/02/16   Khrystyne Arpin L Shruthi Northrup, PA-C  HYDROcodone-acetaminophen (NORCO) 5-325 MG tablet Take 1 tablet by mouth every 6 (six) hours as needed for severe pain. 04/02/16   Temisha Murley L Shyhiem Beeney, PA-C  oxyCODONE-acetaminophen (ROXICET) 5-325 MG tablet Take 1-2 tablets by mouth every 4 (four) hours as needed for severe pain. 11/22/15   Evangeline Dakin, PA-C    Allergies Review of patient's allergies indicates no known  allergies.  No family history on file.  Social History Social History  Substance Use Topics  . Smoking status: Current Every Day Smoker  . Smokeless tobacco: Not on file  . Alcohol use No     Review of Systems  Constitutional: No fever/chills Cardiovascular: No chest pain. Respiratory: No shortness of breath.  Gastrointestinal: No abdominal pain.  No nausea, vomiting.  Genitourinary: Negative for dysuria, hematuria, urethral discharge. No urinary hesitancy, urgency or increased frequency. Musculoskeletal: Negative for general myalgias.  Skin: Positive skin sores, swelling and pain about the pelvic region.  Neurological: Negative for headaches, focal weakness or numbness. No tingling. No loss of bowel or bladder control. 10-point ROS otherwise negative.  ____________________________________________   PHYSICAL EXAM:  VITAL SIGNS: ED Triage Vitals [04/02/16 0938]  Enc Vitals Group     BP 133/73     Pulse Rate 73     Resp 17     Temp 98.2 F (36.8 C)     Temp Source Oral     SpO2 99 %     Weight 170 lb (77.1 kg)     Height 5\' 5"  (1.651 m)     Head Circumference      Peak Flow      Pain Score 10     Pain Loc      Pain Edu?      Excl. in GC?     Physical exam completed int he presence of Kelly Rayburn, PA-S  Constitutional: Alert and oriented. Well appearing and in no acute distress. Eyes:  Conjunctivae are normal without icterus or injection Head: Atraumatic. Hematological/Lymphatic/Immunilogical: Positive right inguinal lymphadenopathy.  Cardiovascular:  Good peripheral circulation. Respiratory: Normal respiratory effort without tachypnea or retractions. Lungs CTAB. Gastrointestinal: Soft and nontender without distension or guarding in all quadrants.  Genitourinary: No scrotal swelling or pain.  Musculoskeletal: No lower extremity tenderness nor edema.  No joint effusions. Neurologic:  Normal speech and language. No gross focal neurologic deficits are  appreciated.  Skin:  Significant swelling and induration noted about the upper groin region at the base of the penis. 2 superficial skin sores noted about the right superior groin region with induration below. No active oozing or weeping. Area is significantly tender to palpation. Skin is warm, dry and intact.  Psychiatric: Mood and affect are normal. Speech and behavior are normal. Patient exhibits appropriate insight and judgement.   ____________________________________________   LABS (all labs ordered are listed, but only abnormal results are displayed)  Labs Reviewed  BASIC METABOLIC PANEL - Abnormal; Notable for the following:       Result Value   Potassium 3.3 (*)    All other components within normal limits  CBC WITH DIFFERENTIAL/PLATELET - Abnormal; Notable for the following:    WBC 13.3 (*)    Neutro Abs 10.5 (*)    Monocytes Absolute 1.3 (*)    All other components within normal limits   ____________________________________________  EKG  None ____________________________________________  RADIOLOGY I have personally viewed and evaluated these images (plain radiographs) as part of my medical decision making, as well as reviewing the written report by the radiologist.  Us Pelvis Limited  Result Date: 04/02/2016 CLINICAL DATA:  Abscess[place] involving the skin of the left groin region. EXAM: LIMITED ULTRASOUND OF PELVIS TECHNIQUE: Limited transabdominal ultrasound examination of the pelvis was performed. COMPARISON:  None. FINDINGS: Focused ultrasound exam was performed in the superficial region of the left groin. This demonstrates day 9.5 x 3.4 x 2.2 cm heterogeneous subcutaneous lesion with irregular margins and substantial internal echoes. Color Doppler evaluation shows no demonstrable flow within the cystic regions of this structure. Imaging features could be compatible with cellulitis and a degree of underlying subcutaneous edema. Superimposed infection/abscess not excluded.  IMPRESSION: Subcutaneous irregular fluid collection without flow signal in the cystic components. Imaging features may be related to focal edema or abscess. The sonographer reports that the region is more superficial than would be expected for groin hernia although close correlation for hernia recommended. Electronically Signed   By: Kennith CenterEric  Mansell M.D.   On: 04/02/2016 11:58    ____________________________________________    PROCEDURES  Procedure(s) performed: None   Procedures   Medications  clindamycin (CLEOCIN) IVPB 600 mg (600 mg Intravenous New Bag/Given 04/02/16 1325)  lidocaine (PF) (XYLOCAINE) 1 % injection (not administered)     ____________________________________________   INITIAL IMPRESSION / ASSESSMENT AND PLAN / ED COURSE  Pertinent labs & imaging results that were available during my care of the patient were reviewed by me and considered in my medical decision making (see chart for details).  Clinical Course  Comment By Time  Urology on call paged for consult.  Hope PigeonJami L Kirtan Sada, PA-C 09/05 1227  I spoke with Dr. Apolinar JunesBrandon and relayed the patient's history, physical exam as well as labs and imaging results. She will come to the emergency department to evaluate the patient. Hope PigeonJami L Holston Oyama, PA-C 09/05 1231  Dr. Apolinar JunesBrandon has evaluated the patient and will be completing incision and drainage. She has requested to give the patient Clindamycin IV and  discharge on oral clindamycin.  Hope Pigeon, PA-C 09/05 1300    Patient's diagnosis is consistent with Abscess of groin. Abscess was incised and drained by Dr. Apolinar Junes in the emergency department. She has requested that the patient be discharged on oral clindamycin and that he follow-up with her outpatient on Thursday for wound recheck. I as well as Dr. Apolinar Junes have had long conversations with the patient with regards to keeping his follow-up appointment. Patient verbalizes that he will see Dr. Apolinar Junes in office Thursday as  scheduled. Patient will be discharged home with prescriptions for clindamycin and Norco to take as directed.  Patient is again asked to contact his local Medicaid office to request a new primary care provider to replace the pediatric office that is currently listed on his insurance. Patient is given ED precautions to return to the ED for any worsening or new symptoms.    ____________________________________________  FINAL CLINICAL IMPRESSION(S) / ED DIAGNOSES  Final diagnoses:  Abscess of groin      NEW MEDICATIONS STARTED DURING THIS VISIT:  New Prescriptions   CLINDAMYCIN (CLEOCIN) 300 MG CAPSULE    Take 1 capsule (300 mg total) by mouth 3 (three) times daily.   HYDROCODONE-ACETAMINOPHEN (NORCO) 5-325 MG TABLET    Take 1 tablet by mouth every 6 (six) hours as needed for severe pain.         Hope Pigeon, PA-C 04/02/16 1340    Emily Filbert, MD 04/02/16 1341

## 2016-04-02 NOTE — ED Notes (Signed)
Pt alert and oriented X4, active, cooperative, pt in NAD. RR even and unlabored, color WNL.  Pt informed to return if any life threatening symptoms occur.   

## 2016-04-02 NOTE — ED Triage Notes (Signed)
Abscess to groin since Sunday.

## 2016-04-02 NOTE — Consult Note (Signed)
Urology Consult  I have been asked to see the patient by Tye Savoy, PA, for evaluation and management of left scrotal abscess.  Chief Complaint: left scrotal pain  History of Present Illness: Jose Mays is a 20 y.o. year old with a history of recurrent scrotal abscesses who presented to the emergency room today with left inguinal/scrotal pain extending to his suprapubic area since Sunday, 2 days ago. He reports that he has been experiencing more pressure and pain. This is very consistent with his previous episodes of scrotal abscesses in the past. He does have a chronic wound just above the base of his penis which occasionally drains spontaneously.  No associated fevers or chills. No urinary symptoms associated.  He does have mild leukocytosis, WBC 13.  Hemodynamically stable.    He last underwent right scrotal I&D by Dr. Hadley Pen unfortunately 6 2017 in the emergency room and never followed up in our clinic. He reports that he has had issues with transportation.  Previous wound cultures have grown strep.   Past Medical History:  Diagnosis Date  . Abscess     Past Surgical History:  Procedure Laterality Date  . TONSILLECTOMY      Home Medications:  No outpatient prescriptions have been marked as taking for the 04/02/16 encounter [facility] Encounter).    Allergies: No Known Allergies  No family history on file.  Social History:  reports that he has been smoking.  He does not have any smokeless tobacco history on file. He reports that he does not drink alcohol or use drugs.  ROS: A complete review of systems was performed.  All systems are negative except for pertinent findings as noted.  Physical Exam:  Vital signs in last 24 hours: Temp:  [98.2 F (36.8 C)] 98.2 F (36.8 C) (09/05 0938) Pulse Rate:  [73] 73 (09/05 0938) Resp:  [17] 17 (09/05 0938) BP: (133)/(73) 133/73 (09/05 0938) SpO2:  [99 %] 99 % (09/05 0938) Weight:  [170 lb (77.1 kg)] 170 lb  (77.1 kg) (09/05 0938) Constitutional:  Alert and oriented, No acute distress HEENT: [place] AT, moist mucus membranes.  Trachea midline, no masses Cardiovascular: Regular rate and rhythm, no clubbing, cyanosis, or edema. Respiratory: Normal respiratory effort, lungs clear bilaterally GI: Abdomen is soft, nontender, nondistended, no abdominal masses GU: Left upper lateral hemiscrotum with significant induration and fluctuation extending into the left inguinal area, approximately 5 cm. There is also induration just above the base of the penis in the midline with a punctate opening through which a tiny amount of purulent material can be expressed. Skin: No rashes, bruises or suspicious lesions Lymph: Palpable left inguinal adenopathy. Neurologic: Grossly intact, no focal deficits, moving all 4 extremities Psychiatric: Normal mood and affect   Laboratory Data:   Recent Labs  04/02/16 1108  WBC 13.3*  HGB 15.7  HCT 45.8    Recent Labs  04/02/16 1108  NA 137  K 3.3*  CL 101  CO2 28  GLUCOSE 87  BUN 10  CREATININE 0.93  CALCIUM 9.1   No results for input(s): LABPT, INR in the last 72 hours. No results for input(s): LABURIN in the last 72 hours. Results for orders placed or performed during the hospital encounter of 07/23/13  Culture, routine-abscess     Status: None   Collection Time: 07/23/13  4:55 PM  Result Value Ref Range Status   Specimen Description ABSCESS SCROTUM RIGHT  Final   Special Requests NONE  Final  Gram Stain   Final    MODERATE WBC PRESENT,BOTH PMN AND MONONUCLEAR NO SQUAMOUS EPITHELIAL CELLS SEEN MODERATE GRAM POSITIVE COCCI IN PAIRS MODERATE GRAM NEGATIVE RODS [facility]formed at [facility]   Culture   Final    RARE STREPTOCOCCUS GROUP C Performed at [facility]   Report Status 07/26/2013 FINAL  Final     Radiologic Imaging: [place] Pelvis Limited  Result Date: 04/02/2016 CLINICAL DATA:  Abscess involving the skin of the left groin region.  EXAM: LIMITED ULTRASOUND OF PELVIS TECHNIQUE: Limited transabdominal ultrasound examination of the pelvis was performed. COMPARISON:  None. FINDINGS: Focused ultrasound exam was performed in the superficial region of the left groin. This demonstrates day 9.5 x 3.4 x 2.2 cm heterogeneous subcutaneous lesion with irregular margins and substantial internal echoes. Color Doppler evaluation shows no demonstrable flow within the cystic regions of this structure. Imaging features could be compatible with cellulitis and a degree of underlying subcutaneous edema. Superimposed infection/abscess not excluded. IMPRESSION: Subcutaneous irregular fluid collection without flow signal in the cystic components. Imaging features may be related to focal edema or abscess. The sonographer reports that the region is more superficial than would be expected for groin hernia although close correlation for hernia recommended. Electronically Signed   By: Kennith CenterEric  Mansell M.D.   On: 04/02/2016 11:58   Scrotal ultrasound personally reviewed today.  Procedure: Verbal informed consent was obtained.  The patient's identity was confirmed.  The patient's left hemiscrotum was prepped and draped in usual sterile fashion. The overlying skin was anesthetized with 1% lidocaine without epinephrine. A 2 cm cruciate incision was then made with a #11 blade over both of the abscesses in the left scrotum. Purulent drainage was expressed from both incisions to drain the abscess.  There was a copious amount of purulent material, proximally 25 cc which was foul-smelling. A wound culture was obtained. Each incision was then explored with a hemostat to break up any loculations. Each incision was then packed with iodoform.  Impression/Assessment:  20 year old male with recurrent scrotal abscesses status post incision and drainage of a large left scrotal/inguinal abscess.  Patient was administered IV clindamycin in the emergency room and will be discharged  home on this medication. Importance of follow-up in 2 days in clinic was discussed in detail. He's had difficulty with outpatient complaints in the past.  Plan:  -Return to clinic in 2 days for dressing change/wound teaching -Clindamycin orally -f/u repeat wound culture  04/02/2016, 1:42 PM  Vanna ScotlandAshley Rhys Anchondo,  MD

## 2016-04-04 ENCOUNTER — Encounter: Payer: Self-pay | Admitting: Urology

## 2016-04-04 ENCOUNTER — Ambulatory Visit (INDEPENDENT_AMBULATORY_CARE_PROVIDER_SITE_OTHER): Payer: Self-pay | Admitting: Urology

## 2016-04-04 VITALS — BP 108/71 | HR 125 | Ht 65.0 in | Wt 169.3 lb

## 2016-04-04 DIAGNOSIS — N492 Inflammatory disorders of scrotum: Secondary | ICD-10-CM

## 2016-04-04 NOTE — Progress Notes (Signed)
04/04/2016 9:40 AM   Jerrilyn Cairo Sharon Seller [birth year] [id number]  Referring provider: No referring provider defined for this encounter.  Chief Complaint  Patient presents with  . Follow-up    Wound care    HPI: The patient is a 20 year old male with a past medical history of recurrent scrotal abscesses. He underwent an I&D 2 days ago in the ED. He presents today for dressing change. He did not pick up his antibiotics (clindamycin) in the interim as he did not have his insurance card.   PMH: Past Medical History:  Diagnosis Date  . Abscess     Surgical History: Past Surgical History:  Procedure Laterality Date  . TONSILLECTOMY      Home Medications:    Medication List       Accurate as of 04/04/16  9:40 AM. Always use your most recent med list.          cephALEXin 500 MG capsule Commonly known as:  KEFLEX Take 1 capsule (500 mg total) by mouth 4 (four) times daily.   clindamycin 300 MG capsule Commonly known as:  CLEOCIN Take 1 capsule (300 mg total) by mouth 3 (three) times daily.   HYDROcodone-acetaminophen 5-325 MG tablet Commonly known as:  NORCO Take 1 tablet by mouth every 6 (six) hours as needed for severe pain.   oxyCODONE-acetaminophen 5-325 MG tablet Commonly known as:  ROXICET Take 1-2 tablets by mouth every 4 (four) hours as needed for severe pain.       Allergies: No Known Allergies  Family History: Family History  Problem Relation Age of Onset  . Prostate cancer Paternal Uncle   . Bladder Cancer Neg Hx     Social History:  reports that he has been smoking.  He has never used smokeless tobacco. He reports that he does not drink alcohol or use drugs.  ROS: UROLOGY Frequent Urination?: No Hard to postpone urination?: No Burning/pain with urination?: No Get up at night to urinate?: No Leakage of urine?: No Urine stream starts and stops?: No Trouble starting stream?: No Do you have to strain to urinate?: No Blood in urine?:  No Urinary tract infection?: No Sexually transmitted disease?: No Injury to kidneys or bladder?: No Painful intercourse?: No Weak stream?: No Erection problems?: No Penile pain?: No  Gastrointestinal Nausea?: No Vomiting?: No Indigestion/heartburn?: No Diarrhea?: No Constipation?: No  Constitutional Fever: No Night sweats?: No Weight loss?: Yes Fatigue?: Yes  Skin Skin rash/lesions?: No Itching?: No  Eyes Blurred vision?: No Double vision?: No  Ears/Nose/Throat Sore throat?: No Sinus problems?: No  Hematologic/Lymphatic Swollen glands?: No Easy bruising?: No  Cardiovascular Leg swelling?: No Chest pain?: No  Respiratory Cough?: No Shortness of breath?: No  Endocrine Excessive thirst?: No  Musculoskeletal Back pain?: No Joint pain?: No  Neurological Headaches?: No Dizziness?: No  Psychologic Depression?: No Anxiety?: No  Physical Exam: BP 108/71 (BP Location: Left Arm, Patient Position: Sitting, Cuff Size: Normal)   Pulse (!) 125   Ht 5\' 5"  (1.651 m)   Wt 169 lb 4.8 oz (76.8 kg)   BMI 28.17 kg/m   Constitutional:  Alert and oriented, No acute distress. HEENT: [place] AT, moist mucus membranes.  Trachea midline, no masses. Cardiovascular: No clubbing, cyanosis, or edema. Respiratory: Normal respiratory effort, no increased work of breathing. GI: Abdomen is soft, nontender, nondistended, no abdominal masses GU: No CVA tenderness. Scrotal abscess noted in the left hemi-scrotum towards the upper aspect near the base of the penis. Packing in place Skin: No  rashes, bruises or suspicious lesions. Lymph: No cervical or inguinal adenopathy. Neurologic: Grossly intact, no focal deficits, moving all 4 extremities. Psychiatric: Normal mood and affect.  Laboratory Data: Lab Results  Component Value Date   WBC 13.3 (H) 04/02/2016   HGB 15.7 04/02/2016   HCT 45.8 04/02/2016   MCV 94.2 04/02/2016   PLT 313 04/02/2016    Lab Results  Component Value  Date   CREATININE 0.93 04/02/2016    No results found for: PSA  No results found for: TESTOSTERONE  No results found for: HGBA1C  Urinalysis No results found for: COLORURINE, APPEARANCEUR, LABSPEC, PHURINE, GLUCOSEU, HGBUR, BILIRUBINUR, KETONESUR, PROTEINUR, UROBILINOGEN, NITRITE, LEUKOCYTESUR  Patient's scrotal packing changed today. No sign of infection. Abscess healing well. Patient instructed on how to change packing.  Assessment & Plan:    1. Scrotal abscess -Packing changed a -Patient shortly to change packing daily until he is no longer able to pack the abscess cavity -Follow-up in one week for wound check -Patient strongly encouraged to pick up antibiotics which he was given a prescription for while in the emergency room  Return in about 1 week (around 04/11/2016) for any provider.  Hildred LaserBrian James Mallissa Lorenzen, MD  [facility] [address], [address] [place], [place] [address] [phone]

## 2016-04-11 ENCOUNTER — Ambulatory Visit (INDEPENDENT_AMBULATORY_CARE_PROVIDER_SITE_OTHER): Payer: Self-pay | Admitting: Urology

## 2016-04-11 ENCOUNTER — Encounter: Payer: Self-pay | Admitting: Urology

## 2016-04-11 VITALS — BP 104/62 | HR 52 | Ht 67.0 in | Wt 173.8 lb

## 2016-04-11 DIAGNOSIS — L0291 Cutaneous abscess, unspecified: Secondary | ICD-10-CM

## 2016-04-11 DIAGNOSIS — N492 Inflammatory disorders of scrotum: Secondary | ICD-10-CM

## 2016-04-11 NOTE — Progress Notes (Signed)
04/11/2016 9:53 PM   Jerrilyn Cairo [place] Seller 1995-10-09 [id number]  Referring provider: [facility] [address] [place], [place] [address]  Chief Complaint  Patient presents with  . Wound Check    1 week scrotal abscess    HPI: Patient is a 20 year old African-American male who underwent I and D of a left scrotal abscess with Dr. Apolinar Junes on 04/02/2016.  Background history Patient presented to the emergency room on 04/02/2016 complaining of a 2 day history of left inguinal and scrotal pain extending to the suprapubic area. He states that this presentation is consistent with his previous episodes of scrotal abscesses in the past.    He has a history of recurrent scrotal abscesses with the last I&D performed in June 2017 for a right scrotal abscess. He did not follow up as scheduled due to transportation issues.  Patient also states that he has a chronic wound just above the base of his penis which occasionally drains spontaneously.  He underwent I&D of the left scrotal abscess on 04/02/2016. He was given a prescription for clindamycin and instructed to follow-up in 2 days for a dressing change and wound teaching.  2 days later, he presented to the office for dressing change and wound teaching. He had not been able to pick up the clindamycin prescription at that time.  Today, he states he is no longer able to pack the wound with iodoform gauze.  He denied any further drainage from the wound.  He denied any pain or pressure. He also denied any fevers, chills, nausea or vomiting.  He has been able to pick up the clindamycin prescription and is taking the antibiotic.  Wound cultures were not sent from the emergency department visits on 04/02/2016.  Patient noted that these abscesses occur a day or 2 after he shaves the area.  He states he shaves in the shower and uses a new razor each time.   PMH: Past Medical History:  Diagnosis Date  . Abscess     Surgical History: Past  Surgical History:  Procedure Laterality Date  . TONSILLECTOMY      Home Medications:    Medication List       Accurate as of 04/11/16  9:53 PM. Always use your most recent med list.          cephALEXin 500 MG capsule Commonly known as:  KEFLEX Take 1 capsule (500 mg total) by mouth 4 (four) times daily.   clindamycin 300 MG capsule Commonly known as:  CLEOCIN Take 1 capsule (300 mg total) by mouth 3 (three) times daily.   HYDROcodone-acetaminophen 5-325 MG tablet Commonly known as:  NORCO Take 1 tablet by mouth every 6 (six) hours as needed for severe pain.   oxyCODONE-acetaminophen 5-325 MG tablet Commonly known as:  ROXICET Take 1-2 tablets by mouth every 4 (four) hours as needed for severe pain.       Allergies: No Known Allergies  Family History: Family History  Problem Relation Age of Onset  . Prostate cancer Paternal Uncle   . Bladder Cancer Neg Hx     Social History:  reports that he has been smoking.  He has never used smokeless tobacco. He reports that he does not drink alcohol or use drugs.  ROS: UROLOGY Frequent Urination?: No Hard to postpone urination?: No Burning/pain with urination?: No Get up at night to urinate?: No Leakage of urine?: No Urine stream starts and stops?: No Trouble starting stream?: No Do you have to strain  to urinate?: No Blood in urine?: No Urinary tract infection?: No Sexually transmitted disease?: No Injury to kidneys or bladder?: No Painful intercourse?: No Weak stream?: No Erection problems?: No Penile pain?: No  Gastrointestinal Nausea?: No Vomiting?: No Indigestion/heartburn?: No Diarrhea?: No Constipation?: No  Constitutional Fever: No Night sweats?: No Weight loss?: No Fatigue?: No  Skin Skin rash/lesions?: No Itching?: No  Eyes Blurred vision?: No Double vision?: No  Ears/Nose/Throat Sore throat?: No Sinus problems?: No  Hematologic/Lymphatic Swollen glands?: No Easy bruising?:  No  Cardiovascular Leg swelling?: No Chest pain?: No  Respiratory Cough?: No Shortness of breath?: No  Endocrine Excessive thirst?: No  Musculoskeletal Back pain?: No Joint pain?: No  Neurological Headaches?: No Dizziness?: No  Psychologic Depression?: No Anxiety?: No  Physical Exam: BP 104/62   Pulse (!) 52   Ht 5\' 7"  (1.702 m)   Wt 173 lb 12.8 oz (78.8 kg)   BMI 27.22 kg/m   Constitutional: Well nourished. Alert and oriented, No acute distress. HEENT: [place] AT, moist mucus membranes. Trachea midline, no masses. Cardiovascular: No clubbing, cyanosis, or edema. Respiratory: Normal respiratory effort, no increased work of breathing. GI: Abdomen is soft, non tender, non distended, no abdominal masses. Liver and spleen not palpable.  No hernias appreciated.  Stool sample for occult testing is not indicated.   GU: No CVA tenderness.  No bladder fullness or masses.  Patient with circumcised phallus.  Urethral meatus is patent.  No penile discharge. No penile lesions or rashes. Scrotal wounds are closed and clean and dry.  No fluctuance is noted in the left scrotum.  No pus is expressed with palpation.   Testicles are located scrotally bilaterally. No masses are appreciated in the testicles. Left and right epididymis are normal. Rectal: Deferred Skin: An area of fluctuance is noted on the left thigh. It is 30 mm by 10 mm.  No sign of cellulitis is noted. Lymph: No cervical or inguinal adenopathy. Neurologic: Grossly intact, no focal deficits, moving all 4 extremities. Psychiatric: Normal mood and affect.  Laboratory Data: Lab Results  Component Value Date   WBC 13.3 (H) 04/02/2016   HGB 15.7 04/02/2016   HCT 45.8 04/02/2016   MCV 94.2 04/02/2016   PLT 313 04/02/2016    Lab Results  Component Value Date   CREATININE 0.93 04/02/2016     Lab Results  Component Value Date   AST 19 03/17/2014   Lab Results  Component Value Date   ALT 18 03/17/2014    Procedure Verbal informed consent was obtained.  The patient's identity was confirmed.  The patient's left thigh was prepped and draped in usual sterile fashion.  The overlying skin was anesthetized with 1% lidocaine without epinephrine. A stab wound was then made with a #15 blade over the abscess in the left thigh. Purulent drainage was expressed from wound to drain the abscess.  There was a small amount of purulent material, proximally 2 cc which was foul-smelling.  Incision was then explored with a sterile Q-tip to break up any loculations.  Incision was then packed with iodoform.  Assessment & Plan:    1. Scrotal abscess  - healing well  2. Skin abscess  - continue the Clindamycin  - advised patient to stop shaving the perineal and scrotal area  - suggested waxing if he is determine to remove pubic hair  - RTC in one week for wound recheck  - Advised to contact our office or seek treatment in the ED if becomes febrile,  wound increases in size or purulent drainage in order to arrange for emergent/urgent intervention  Return in about 1 week (around 04/18/2016) for wound check.  These notes generated with voice recognition software. I apologize for typographical errors.  Michiel Cowboy, PA-C  [facility] [address], [address] [place], [place] [address] [phone]

## 2016-04-18 ENCOUNTER — Ambulatory Visit: Payer: Medicaid Other | Admitting: Urology

## 2016-05-08 ENCOUNTER — Emergency Department: Payer: Self-pay

## 2016-05-08 ENCOUNTER — Emergency Department
Admission: EM | Admit: 2016-05-08 | Discharge: 2016-05-09 | Disposition: A | Discharge status: Home / Self Care | Payer: Self-pay | Attending: Emergency Medicine | Admitting: Emergency Medicine

## 2016-05-08 DIAGNOSIS — N492 Inflammatory disorders of scrotum: Secondary | ICD-10-CM | POA: Insufficient documentation

## 2016-05-08 DIAGNOSIS — F17200 Nicotine dependence, unspecified, uncomplicated: Secondary | ICD-10-CM | POA: Insufficient documentation

## 2016-05-08 DIAGNOSIS — L0291 Cutaneous abscess, unspecified: Secondary | ICD-10-CM

## 2016-05-08 LAB — URINALYSIS COMPLETE WITH MICROSCOPIC (ARMC ONLY)
Bacteria, UA: NONE SEEN
Bilirubin Urine: NEGATIVE
Glucose, UA: NEGATIVE mg/dL
HGB URINE DIPSTICK: NEGATIVE
KETONES UR: NEGATIVE mg/dL
Nitrite: NEGATIVE
Protein, ur: 100 mg/dL — AB
SPECIFIC GRAVITY, URINE: 1.033 — AB (ref 1.005–1.030)
pH: 5 (ref 5.0–8.0)

## 2016-05-08 LAB — CBC WITH DIFFERENTIAL/PLATELET
Basophils Absolute: 0.1 10*3/uL (ref 0–0.1)
Basophils Relative: 1 %
EOS ABS: 0.2 10*3/uL (ref 0–0.7)
EOS PCT: 1 %
HCT: 40 % (ref 40.0–52.0)
Hemoglobin: 14 g/dL (ref 13.0–18.0)
LYMPHS ABS: 1.7 10*3/uL (ref 1.0–3.6)
LYMPHS PCT: 16 %
MCH: 32.5 pg (ref 26.0–34.0)
MCHC: 35 g/dL (ref 32.0–36.0)
MCV: 92.8 fL (ref 80.0–100.0)
MONO ABS: 0.9 10*3/uL (ref 0.2–1.0)
MONOS PCT: 8 %
Neutro Abs: 7.8 10*3/uL — ABNORMAL HIGH (ref 1.4–6.5)
Neutrophils Relative %: 74 %
PLATELETS: 308 10*3/uL (ref 150–440)
RBC: 4.31 MIL/uL — ABNORMAL LOW (ref 4.40–5.90)
RDW: 12.7 % (ref 11.5–14.5)
WBC: 10.6 10*3/uL (ref 3.8–10.6)

## 2016-05-08 LAB — BASIC METABOLIC PANEL
Anion gap: 9 (ref 5–15)
BUN: 11 mg/dL (ref 6–20)
CHLORIDE: 103 mmol/L (ref 101–111)
CO2: 26 mmol/L (ref 22–32)
Calcium: 8.7 mg/dL — ABNORMAL LOW (ref 8.9–10.3)
Creatinine, Ser: 0.93 mg/dL (ref 0.61–1.24)
GFR calc Af Amer: >60 mL/min (ref >60)
GFR calc non Af Amer: >60 mL/min (ref >60)
GLUCOSE: 94 mg/dL (ref 65–99)
Potassium: 3.1 mmol/L — ABNORMAL LOW (ref 3.5–5.1)
SODIUM: 138 mmol/L (ref 135–145)

## 2016-05-08 MED ORDER — KETOROLAC TROMETHAMINE 30 MG/ML IJ SOLN
30.0000 mg | Freq: Once | INTRAMUSCULAR | Status: AC
  Administered 2016-05-08: 30 mg via INTRAVENOUS
  Filled 2016-05-08: qty 1

## 2016-05-08 MED ORDER — CLINDAMYCIN PHOSPHATE 600 MG/50ML IV SOLN
600.0000 mg | Freq: Once | INTRAVENOUS | Status: AC
  Administered 2016-05-08: 600 mg via INTRAVENOUS
  Filled 2016-05-08: qty 50

## 2016-05-08 NOTE — ED Notes (Signed)
Patient transported to Ultrasound 

## 2016-05-08 NOTE — ED Triage Notes (Signed)
Patient ambulatory to triage with steady gait, without difficulty or distress noted; pt reports boil to left groin since yesterday

## 2016-05-08 NOTE — ED Notes (Signed)
Pt reports a boil/abscess on the left side of his scrotum since Monday. Pt denies drainage; pt also denies fever, no reports of N/V/D.

## 2016-05-09 ENCOUNTER — Ambulatory Visit: Payer: Medicaid Other | Admitting: Urology

## 2016-05-09 MED ORDER — HYDROCODONE-ACETAMINOPHEN 5-325 MG PO TABS
1.0000 | ORAL_TABLET | Freq: Once | ORAL | Status: AC
  Administered 2016-05-09: 1 via ORAL
  Filled 2016-05-09: qty 1

## 2016-05-09 MED ORDER — CLINDAMYCIN HCL 300 MG PO CAPS: 300.0000 mg | ORAL_CAPSULE | Freq: Three times a day (TID) | ORAL | 0 refills | Status: AC

## 2016-05-09 MED ORDER — OXYCODONE-ACETAMINOPHEN 5-325 MG PO TABS: 1.0000 | ORAL_TABLET | Freq: Three times a day (TID) | ORAL | 0 refills | Status: DC | PRN

## 2016-05-09 NOTE — ED Notes (Signed)

## 2016-05-09 NOTE — ED Provider Notes (Signed)
[facility] Emergency Department Provider Note ____________________________________________  Time seen: 2030  I have reviewed the triage vital signs and the nursing notes.  HISTORY  Chief Complaint  Abscess  HPI Jose Mays is a 20 y.o. male resents to the ED for evaluation of her recurrent scrotal abscess.the patient describes onset of scrota abscess since 3 days prior. He gives a recent history of a incision and drainage procedure performed in the ED by the urologist about 4 weeks prior. He had planned follow-up in the office and reports good wound healing in the interim.he presents now with increasing pain, swelling to the left scrotum. He denies any dysuria, hematuria, or spontaneous drainage. He denies any fevers or chills, but nose intermittent sweats.  Past Medical History:  Diagnosis Date  . Abscess     There are no active problems to display for this patient.   Past Surgical History:  Procedure Laterality Date  . TONSILLECTOMY      Prior to Admission medications   Medication Sig Start Date End Date Taking? Authorizing Provider  cephALEXin (KEFLEX) 500 MG capsule Take 1 capsule (500 mg total) by mouth 4 (four) times daily. Patient not taking: Reported on 04/11/2016 11/22/15   Charmayne Sheerharles M Beers, PA-C  clindamycin (CLEOCIN) 300 MG capsule Take 1 capsule (300 mg total) by mouth 3 (three) times daily. 05/09/16 05/19/16  Kalee Broxton V Bacon Dillyn Joaquin, PA-C  HYDROcodone-acetaminophen (NORCO) 5-325 MG tablet Take 1 tablet by mouth every 6 (six) hours as needed for severe pain. Patient not taking: Reported on 04/11/2016 04/02/16   Jami L Hagler, PA-C  oxyCODONE-acetaminophen (ROXICET) 5-325 MG tablet Take 1 tablet by mouth every 8 (eight) hours as needed for moderate pain or severe pain. 05/09/16   Yazeed Pryer V Bacon Dahiana Kulak, PA-C   Allergies Review of patient's allergies indicates no known allergies.  Family History  Problem Relation Age of Onset  . Prostate cancer  Paternal Uncle   . Bladder Cancer Neg Hx     Social History Social History  Substance Use Topics  . Smoking status: Current Every Day Smoker  . Smokeless tobacco: Never Used  . Alcohol use No   Review of Systems  Constitutional: Negative for fever. Genitourinary: Negative for dysuria. Scrotal abscess as above.  Musculoskeletal: Negative for back pain. Skin: Negative for rash. Neurological: Negative for headaches, focal weakness or numbness. ____________________________________________  PHYSICAL EXAM:  VITAL SIGNS: ED Triage Vitals [05/08/16 2014]  Enc Vitals Group     BP (!) 116/51     Pulse Rate 97     Resp 18     Temp 98.4 F (36.9 C)     Temp Source Oral     SpO2 97 %     Weight 160 lb (72.6 kg)     Height 5\' 6"  (1.676 m)     Head Circumference      Peak Flow      Pain Score 8     Pain Loc      Pain Edu?      Excl. in GC?    Constitutional: Alert and oriented. Well appearing and in no distress. Hematological/Lymphatic/Immunological: left inguinal lymphadenopathy. Respiratory: Normal respiratory effort.  GU: Male patient with a circumcised penis without lesions. Focal area of induration noted to the left proximal scrotum and extending into the inguinal canal. No pointing, fluctuance, or spontaneous drainage.  Neurologic:  Normal gait without ataxia. Normal speech and language. No gross focal neurologic deficits are appreciated. Skin:  Skin is warm,  dry and intact. No rash noted. ____________________________________________   LABS (pertinent positives/negatives) Labs Reviewed  BASIC METABOLIC PANEL - Abnormal; Notable for the following:       Result Value   Potassium 3.1 (*)    Calcium 8.7 (*)    All other components within normal limits  CBC WITH DIFFERENTIAL/PLATELET - Abnormal; Notable for the following:    RBC 4.31 (*)    Neutro Abs 7.8 (*)    All other components within normal limits  URINALYSIS COMPLETEWITH MICROSCOPIC ([facility] ONLY) - Abnormal;  Notable for the following:    Color, Urine YELLOW (*)    APPearance HAZY (*)    Specific Gravity, Urine 1.033 (*)    Protein, ur 100 (*)    Leukocytes, UA 2+ (*)    Squamous Epithelial / LPF 0-5 (*)    All other components within normal limits  ____________________________________________   RADIOLOGY  Scrotal Ultrasound IMPRESSION: In the area of interest in the left lateral scrotal wall there is a debris-filled fluid collection with surrounding edema and hyperemia measuring up to 3.6 cm probably representing an abscess. No evidence for epididymo-orchitis or torsion at this time. ____________________________________________  PROCEDURES  Clindamycin 600 mg IV Norco 5-325 mg PO ____________________________________________  INITIAL IMPRESSION / ASSESSMENT AND PLAN / ED COURSE  ----------------------------------------- 11:50 PM on 05/09/2016 ----------------------------------------- Spoke with Dr. Vanna Scotland (urology) she will see the patient in the office in the morning for I&D procedure.   Patient is discharged with instructions to follow-up with Dr. Apolinar Junes at [facility] urology, he verbalizes understanding and agrees to present himself. He is discharged with prescriptions for clindamycin and oxycodone.   Clinical Course   ____________________________________________  FINAL CLINICAL IMPRESSION(S) / ED DIAGNOSES  Final diagnoses:  Scrotal abscess      Lissa Hoard, PA-C 05/09/16 0020    Phineas Semen, MD 05/10/16 [phone]

## 2016-05-09 NOTE — Discharge Instructions (Signed)
Follow-up with Dr. Apolinar JunesBrandon in the morning as discussed. Start the antibiotic and pain medicines today (Thursday) as directed.

## 2016-05-10 ENCOUNTER — Ambulatory Visit (INDEPENDENT_AMBULATORY_CARE_PROVIDER_SITE_OTHER): Payer: Self-pay | Admitting: Urology

## 2016-05-10 VITALS — BP 117/69 | HR 63 | Ht 66.0 in | Wt 166.0 lb

## 2016-05-10 DIAGNOSIS — N492 Inflammatory disorders of scrotum: Secondary | ICD-10-CM

## 2016-05-10 NOTE — Progress Notes (Signed)
05/10/2016 10:15 AM   Jose Mays 02/22/1996 [id number]  Ref[facility]: [facility] [address] [place], [place] [id number]  Chief Complaint  Patient presents with  . Groin Swelling    scrotal abcess    HPI: 20 year old male with recurrent scrotal abscesses who returns to the office today for reevaluation. He last underwent scrotal I&D in the emergency room on 04/02/2016 for left-sided hemiscrotal abscess which tracked towards his suprapubic area.  He returned again to the emergency room overnight on 05/08/2016 with recurrent abscess. He was otherwise clinically stable and discharged with a plan for I&D in the office yesterday, but had to reschedule for today. He states that the abscess started draining spontaneously from the previous incision site shortly after he was in the emergency room. He did receive antibiotics in the emergency room but never picked up his prescription and has not been taking them.  He denies any fevers or chills. His scrotal pain has improved since the abscess spontaneously drained.   PMH: Past Medical History:  Diagnosis Date  . Abscess     Surgical History: Past Surgical History:  Procedure Laterality Date  . TONSILLECTOMY      Home Medications:    Medication List       Accurate as of 05/10/16 10:15 AM. Always use your most recent med list.          clindamycin 300 MG capsule Commonly known as:  CLEOCIN Take 1 capsule (300 mg total) by mouth 3 (three) times daily.   oxyCODONE-acetaminophen 5-325 MG tablet Commonly known as:  ROXICET Take 1 tablet by mouth every 8 (eight) hours as needed for moderate pain or severe pain.       Allergies: No Known Allergies  Family History: Family History  Problem Relation Age of Onset  . Prostate cancer Paternal Uncle   . Bladder Cancer Neg Hx     Social History:  reports that he has been smoking.  He has never used smokeless tobacco. He reports that he does not drink  alcohol or use drugs.  ROS: UROLOGY Frequent Urination?: No Hard to postpone urination?: No Burning/pain with urination?: No Get up at night to urinate?: No Leakage of urine?: No Urine stream starts and stops?: No Trouble starting stream?: No Do you have to strain to urinate?: No Blood in urine?: No Urinary tract infection?: No Sexually transmitted disease?: No Injury to kidneys or bladder?: No Painful intercourse?: No Weak stream?: No Erection problems?: No Penile pain?: No  Gastrointestinal Nausea?: No Vomiting?: No Indigestion/heartburn?: No Diarrhea?: No Constipation?: No  Constitutional Fever: No Night sweats?: No Weight loss?: Yes Fatigue?: Yes  Skin Skin rash/lesions?: No Itching?: No  Eyes Blurred vision?: No Double vision?: No  Ears/Nose/Throat Sore throat?: No Sinus problems?: No  Hematologic/Lymphatic Swollen glands?: No Easy bruising?: No  Cardiovascular Leg swelling?: No Chest pain?: No  Respiratory Cough?: No Shortness of breath?: No  Endocrine Excessive thirst?: No  Musculoskeletal Back pain?: No Joint pain?: No  Neurological Headaches?: No Dizziness?: No  Psychologic Depression?: No Anxiety?: No  Physical Exam: BP 117/69   Pulse 63   Ht 5\' 6"  (1.676 m)   Wt 166 lb (75.3 kg)   BMI 26.79 kg/m   Constitutional:  Alert and oriented, No acute distress. HEENT: [place] AT, moist mucus membranes.  Trachea midline, no masses. Cardiovascular: No clubbing, cyanosis, or edema. Respiratory: Normal respiratory effort, no increased work of breathing. GI: Abdomen is soft, nontender, nondistended, no abdominal masses GU: Induration  of the left lateral hemiscrotum with slight opening in the skin at the site of previous I&D incision site. When probed, a tract was able to be opened with scant amount of pus. There is also slight induration tracking superiorly and at the base of the penis superiorly. No erythema. Skin: No rashes, bruises or  suspicious lesions. Lymph: No cervical or inguinal adenopathy. Neurologic: Grossly intact, no focal deficits, moving all 4 extremities. Psychiatric: Normal mood and affect.  Laboratory Data: Lab Results  Component Value Date   WBC 10.6 05/08/2016   HGB 14.0 05/08/2016   HCT 40.0 05/08/2016   MCV 92.8 05/08/2016   PLT 308 05/08/2016    Lab Results  Component Value Date   CREATININE 0.93 05/08/2016    Urinalysis    Component Value Date/Time   COLORURINE YELLOW (A) 05/08/2016 2104   APPEARANCEUR HAZY (A) 05/08/2016 2104   LABSPEC 1.033 (H) 05/08/2016 2104   PHURINE 5.0 05/08/2016 2104   GLUCOSEU NEGATIVE 05/08/2016 2104   HGBUR NEGATIVE 05/08/2016 2104   BILIRUBINUR NEGATIVE 05/08/2016 2104   KETONESUR NEGATIVE 05/08/2016 2104   PROTEINUR 100 (A) 05/08/2016 2104   NITRITE NEGATIVE 05/08/2016 2104   LEUKOCYTESUR 2+ (A) 05/08/2016 2104    Pertinent Imaging: CLINICAL DATA:  Recurrent left scrotal abscess/ cellulitis for 3 days.  EXAM: SCROTAL ULTRASOUND  DOPPLER ULTRASOUND OF THE TESTICLES  TECHNIQUE: Complete ultrasound examination of the testicles, epididymis, and other scrotal structures was performed. Color and spectral Doppler ultrasound were also utilized to evaluate blood flow to the testicles.  COMPARISON:  04/02/2016 scrotal ultrasound  FINDINGS: Right testicle  Measurements: 4.2 x 2.2 x 2.6 cm. No mass or microlithiasis visualized.  Left testicle  Measurements: 5.0 x 2.6 x 2.8 cm. No mass or microlithiasis visualized.  Right epididymis:  Normal in size and appearance.  Left epididymis:  Normal in size and appearance.  Hydrocele:  None visualized.  Varicocele:  None visualized.  Pulsed Doppler interrogation of both testes demonstrates normal low resistance arterial and venous waveforms bilaterally.  Other: Left lateral scrotal wall debris filled hypovascular collection with surrounding edema and hypervascularity measuring  3.6 x 2.5 x 3.5 cm.  IMPRESSION: In the area of interest in the left lateral scrotal wall there is a debris-filled fluid collection with surrounding edema and hyperemia measuring up to 3.6 cm probably representing an abscess. No evidence for epididymo-orchitis or torsion at this time.   Electronically Signed   By: Mitzi Hansen M.D.   On: 05/08/2016 23:03  Scrotal ultrasound personally reviewed  Procedure: Patient was prepped using Betadine solution. 1% lidocaine, proximal with 6 cc was instilled around the left lateral scrotal abscess site. A small opening at the previous incision site was probed using a sterile Q-tip and opened slightly. Once appropriate anesthesia was achieved, an 11 blade was used to extend this incision and opened it up more widely. The Q-tip was used to probe the base of the abscess and superiorly. A scant amount of purulence was drained.  The wound was then packed using quarter inch iodoform packing gauze.  Assessment & Plan:    1. Scrotal abscess Recurrent left hemiscrotal abscess s/p repeat I&D Patient reminded to go pick up clindamycin today, I advised that compliance with medication and follow-up is the key to proper wound healing Scrotal support Daily packing changes as possible If abscess recurs, may consider more extensive debridement and exploration in the operating room.   Return in about 1 week (around 05/17/2016) for wound check.  Morrie Sheldon  Erlene Quan, [facility] [address] [place], [place] [address] [phone]

## 2016-05-17 ENCOUNTER — Ambulatory Visit: Payer: Medicaid Other

## 2016-05-22 ENCOUNTER — Observation Stay
Admission: EM | Admit: 2016-05-22 | Discharge: 2016-05-24 | Disposition: A | Discharge status: Home / Self Care | Payer: Self-pay | Attending: Internal Medicine | Admitting: Internal Medicine

## 2016-05-22 ENCOUNTER — Encounter: Payer: Self-pay | Admitting: Emergency Medicine

## 2016-05-22 DIAGNOSIS — Z8042 Family history of malignant neoplasm of prostate: Secondary | ICD-10-CM | POA: Insufficient documentation

## 2016-05-22 DIAGNOSIS — K4020 Bilateral inguinal hernia, without obstruction or gangrene, not specified as recurrent: Secondary | ICD-10-CM | POA: Insufficient documentation

## 2016-05-22 DIAGNOSIS — K2920 Alcoholic gastritis without bleeding: Principal | ICD-10-CM | POA: Insufficient documentation

## 2016-05-22 DIAGNOSIS — R197 Diarrhea, unspecified: Secondary | ICD-10-CM | POA: Insufficient documentation

## 2016-05-22 DIAGNOSIS — K219 Gastro-esophageal reflux disease without esophagitis: Secondary | ICD-10-CM | POA: Insufficient documentation

## 2016-05-22 DIAGNOSIS — E872 Acidosis, unspecified: Secondary | ICD-10-CM

## 2016-05-22 DIAGNOSIS — R112 Nausea with vomiting, unspecified: Secondary | ICD-10-CM | POA: Diagnosis present

## 2016-05-22 DIAGNOSIS — Z23 Encounter for immunization: Secondary | ICD-10-CM | POA: Insufficient documentation

## 2016-05-22 DIAGNOSIS — F1290 Cannabis use, unspecified, uncomplicated: Secondary | ICD-10-CM | POA: Insufficient documentation

## 2016-05-22 DIAGNOSIS — F17200 Nicotine dependence, unspecified, uncomplicated: Secondary | ICD-10-CM | POA: Insufficient documentation

## 2016-05-22 DIAGNOSIS — E876 Hypokalemia: Secondary | ICD-10-CM | POA: Diagnosis present

## 2016-05-22 DIAGNOSIS — F1010 Alcohol abuse, uncomplicated: Secondary | ICD-10-CM | POA: Insufficient documentation

## 2016-05-22 NOTE — ED Triage Notes (Signed)
Pt presents to ED with c/o generalized abdominal pain since Friday, vomiting and diarrhea since the weekend. Pt diaphoretic, moaning in pain. Pt vomiting in triage.

## 2016-05-23 ENCOUNTER — Emergency Department: Payer: Self-pay

## 2016-05-23 DIAGNOSIS — E876 Hypokalemia: Secondary | ICD-10-CM

## 2016-05-23 DIAGNOSIS — R112 Nausea with vomiting, unspecified: Secondary | ICD-10-CM | POA: Diagnosis present

## 2016-05-23 HISTORY — DX: Hypokalemia: E87.6

## 2016-05-23 LAB — GLUCOSE, CAPILLARY: GLUCOSE-CAPILLARY: 161 mg/dL — AB (ref 65–99)

## 2016-05-23 LAB — URINE DRUG SCREEN, QUALITATIVE (ARMC ONLY)
Amphetamines, Ur Screen: NOT DETECTED
Barbiturates, Ur Screen: NOT DETECTED
Benzodiazepine, Ur Scrn: NOT DETECTED
COCAINE METABOLITE, UR ~~LOC~~: NOT DETECTED
Cannabinoid 50 Ng, Ur ~~LOC~~: POSITIVE — AB
MDMA (ECSTASY) UR SCREEN: NOT DETECTED
METHADONE SCREEN, URINE: NOT DETECTED
OPIATE, UR SCREEN: POSITIVE — AB
Phencyclidine (PCP) Ur S: NOT DETECTED
TRICYCLIC, UR SCREEN: NOT DETECTED

## 2016-05-23 LAB — URINALYSIS COMPLETE WITH MICROSCOPIC (ARMC ONLY)
BACTERIA UA: NONE SEEN
BILIRUBIN URINE: NEGATIVE
GLUCOSE, UA: 150 mg/dL — AB
Hgb urine dipstick: NEGATIVE
Leukocytes, UA: NEGATIVE
Nitrite: NEGATIVE
Protein, ur: NEGATIVE mg/dL
SPECIFIC GRAVITY, URINE: 1.051 — AB (ref 1.005–1.030)
pH: 7 (ref 5.0–8.0)

## 2016-05-23 LAB — CBC WITH DIFFERENTIAL/PLATELET
Basophils Absolute: 0.1 10*3/uL (ref 0–0.1)
Basophils Relative: 1 %
Eosinophils Absolute: 0 10*3/uL (ref 0–0.7)
Eosinophils Relative: 0 %
HEMATOCRIT: 45.1 % (ref 40.0–52.0)
HEMOGLOBIN: 15.2 g/dL (ref 13.0–18.0)
LYMPHS ABS: 1.5 10*3/uL (ref 1.0–3.6)
Lymphocytes Relative: 13 %
MCH: 31.7 pg (ref 26.0–34.0)
MCHC: 33.7 g/dL (ref 32.0–36.0)
MCV: 94.2 fL (ref 80.0–100.0)
MONO ABS: 0.7 10*3/uL (ref 0.2–1.0)
MONOS PCT: 6 %
Neutro Abs: 9.5 10*3/uL — ABNORMAL HIGH (ref 1.4–6.5)
Neutrophils Relative %: 80 %
Platelets: 379 10*3/uL (ref 150–440)
RBC: 4.79 MIL/uL (ref 4.40–5.90)
RDW: 12.8 % (ref 11.5–14.5)
WBC: 11.8 10*3/uL — AB (ref 3.8–10.6)

## 2016-05-23 LAB — COMPREHENSIVE METABOLIC PANEL
ALBUMIN: 4.4 g/dL (ref 3.5–5.0)
ALK PHOS: 83 U/L (ref 38–126)
ALT: 18 U/L (ref 17–63)
ANION GAP: 17 — AB (ref 5–15)
AST: 35 U/L (ref 15–41)
BILIRUBIN TOTAL: 0.8 mg/dL (ref 0.3–1.2)
BUN: 9 mg/dL (ref 6–20)
CALCIUM: 9.4 mg/dL (ref 8.9–10.3)
CO2: 21 mmol/L — ABNORMAL LOW (ref 22–32)
Chloride: 102 mmol/L (ref 101–111)
Creatinine, Ser: 1.02 mg/dL (ref 0.61–1.24)
GLUCOSE: 160 mg/dL — AB (ref 65–99)
POTASSIUM: 3 mmol/L — AB (ref 3.5–5.1)
Sodium: 140 mmol/L (ref 135–145)
TOTAL PROTEIN: 8.1 g/dL (ref 6.5–8.1)

## 2016-05-23 LAB — BLOOD GAS, VENOUS
Acid-Base Excess: 4.7 mmol/L — ABNORMAL HIGH (ref 0.0–2.0)
BICARBONATE: 30.9 mmol/L — AB (ref 20.0–28.0)
FIO2: 0.28
PH VEN: 7.39 (ref 7.250–7.430)
Patient temperature: 37
pCO2, Ven: 51 mmHg (ref 44.0–60.0)

## 2016-05-23 LAB — ETHANOL: ALCOHOL ETHYL (B): 6 mg/dL — AB (ref <5)

## 2016-05-23 LAB — LIPASE, BLOOD: Lipase: 20 U/L (ref 11–51)

## 2016-05-23 LAB — MAGNESIUM: MAGNESIUM: 1.5 mg/dL — AB (ref 1.7–2.4)

## 2016-05-23 MED ORDER — LORAZEPAM 2 MG/ML IJ SOLN
0.5000 mg | Freq: Once | INTRAMUSCULAR | Status: AC
  Administered 2016-05-23: 0.5 mg via INTRAVENOUS
  Filled 2016-05-23: qty 1

## 2016-05-23 MED ORDER — ONDANSETRON HCL 4 MG/2ML IJ SOLN
4.0000 mg | Freq: Once | INTRAMUSCULAR | Status: AC
  Administered 2016-05-23: 4 mg via INTRAVENOUS
  Filled 2016-05-23: qty 2

## 2016-05-23 MED ORDER — MORPHINE SULFATE (PF) 2 MG/ML IV SOLN
INTRAVENOUS | Status: AC
  Administered 2016-05-23: 2 mg via INTRAVENOUS
  Filled 2016-05-23: qty 1

## 2016-05-23 MED ORDER — ONDANSETRON HCL 4 MG/2ML IJ SOLN
INTRAMUSCULAR | Status: AC
  Administered 2016-05-23: 4 mg via INTRAVENOUS
  Filled 2016-05-23: qty 2

## 2016-05-23 MED ORDER — MORPHINE SULFATE (PF) 2 MG/ML IV SOLN
2.0000 mg | Freq: Once | INTRAVENOUS | Status: AC
  Administered 2016-05-23: 2 mg via INTRAVENOUS

## 2016-05-23 MED ORDER — ONDANSETRON HCL 4 MG/2ML IJ SOLN
4.0000 mg | Freq: Once | INTRAMUSCULAR | Status: AC
  Administered 2016-05-23: 4 mg via INTRAVENOUS

## 2016-05-23 MED ORDER — FOLIC ACID 1 MG PO TABS
1.0000 mg | ORAL_TABLET | Freq: Every day | ORAL | Status: DC
  Administered 2016-05-24: 1 mg via ORAL
  Filled 2016-05-23: qty 1

## 2016-05-23 MED ORDER — LORAZEPAM 2 MG/ML IJ SOLN: 1.0000 mg | Freq: Four times a day (QID) | INTRAMUSCULAR | Status: DC | PRN

## 2016-05-23 MED ORDER — ACETAMINOPHEN 650 MG RE SUPP: 650.0000 mg | Freq: Four times a day (QID) | RECTAL | Status: DC | PRN

## 2016-05-23 MED ORDER — VITAMIN B-1 100 MG PO TABS
100.0000 mg | ORAL_TABLET | Freq: Every day | ORAL | Status: DC
  Administered 2016-05-24: 100 mg via ORAL
  Filled 2016-05-23: qty 1

## 2016-05-23 MED ORDER — IOPAMIDOL (ISOVUE-300) INJECTION 61%
30.0000 mL | Freq: Once | INTRAVENOUS | Status: AC
  Administered 2016-05-23: 30 mL via ORAL

## 2016-05-23 MED ORDER — ONDANSETRON HCL 4 MG/2ML IJ SOLN: 4.0000 mg | Freq: Four times a day (QID) | INTRAMUSCULAR | Status: DC | PRN

## 2016-05-23 MED ORDER — THIAMINE HCL 100 MG/ML IJ SOLN
Freq: Once | INTRAVENOUS | Status: AC
  Administered 2016-05-23: 09:00:00 via INTRAVENOUS
  Filled 2016-05-23 (×2): qty 1000

## 2016-05-23 MED ORDER — MAGNESIUM SULFATE 4 GM/100ML IV SOLN
4.0000 g | Freq: Once | INTRAVENOUS | Status: AC
  Administered 2016-05-23: 4 g via INTRAVENOUS
  Filled 2016-05-23: qty 100

## 2016-05-23 MED ORDER — ADULT MULTIVITAMIN W/MINERALS CH
1.0000 | ORAL_TABLET | Freq: Every day | ORAL | Status: DC
  Administered 2016-05-23 – 2016-05-24 (×2): 1 via ORAL
  Filled 2016-05-23 (×2): qty 1

## 2016-05-23 MED ORDER — INFLUENZA VAC SPLIT QUAD 0.5 ML IM SUSY
0.5000 mL | PREFILLED_SYRINGE | INTRAMUSCULAR | Status: AC
  Administered 2016-05-23: 0.5 mL via INTRAMUSCULAR
  Filled 2016-05-23: qty 0.5

## 2016-05-23 MED ORDER — FAMOTIDINE IN NACL 20-0.9 MG/50ML-% IV SOLN
20.0000 mg | Freq: Two times a day (BID) | INTRAVENOUS | Status: DC
  Administered 2016-05-23 (×2): 20 mg via INTRAVENOUS
  Filled 2016-05-23 (×5): qty 50

## 2016-05-23 MED ORDER — SODIUM CHLORIDE 0.9 % IV BOLUS (SEPSIS)
1000.0000 mL | Freq: Once | INTRAVENOUS | Status: AC
  Administered 2016-05-23: 1000 mL via INTRAVENOUS

## 2016-05-23 MED ORDER — ONDANSETRON HCL 4 MG/2ML IJ SOLN: 4.0000 mg | Freq: Once | INTRAMUSCULAR | Status: DC

## 2016-05-23 MED ORDER — FAMOTIDINE IN NACL 20-0.9 MG/50ML-% IV SOLN
20.0000 mg | Freq: Once | INTRAVENOUS | Status: AC
  Administered 2016-05-23: 20 mg via INTRAVENOUS
  Filled 2016-05-23: qty 50

## 2016-05-23 MED ORDER — LORAZEPAM 1 MG PO TABS: 1.0000 mg | ORAL_TABLET | Freq: Four times a day (QID) | ORAL | Status: DC | PRN

## 2016-05-23 MED ORDER — LORAZEPAM 2 MG PO TABS: 0.0000 mg | ORAL_TABLET | Freq: Four times a day (QID) | ORAL | Status: DC

## 2016-05-23 MED ORDER — ONDANSETRON HCL 4 MG PO TABS: 4.0000 mg | ORAL_TABLET | Freq: Four times a day (QID) | ORAL | Status: DC | PRN

## 2016-05-23 MED ORDER — IOPAMIDOL (ISOVUE-300) INJECTION 61%
100.0000 mL | Freq: Once | INTRAVENOUS | Status: AC | PRN
  Administered 2016-05-23: 100 mL via INTRAVENOUS

## 2016-05-23 MED ORDER — KCL IN DEXTROSE-NACL 20-5-0.45 MEQ/L-%-% IV SOLN
Freq: Once | INTRAVENOUS | Status: AC
  Administered 2016-05-23: 03:00:00 via INTRAVENOUS
  Filled 2016-05-23: qty 1000

## 2016-05-23 MED ORDER — POTASSIUM CHLORIDE 10 MEQ/100ML IV SOLN
10.0000 meq | INTRAVENOUS | Status: AC
  Administered 2016-05-23 (×3): 10 meq via INTRAVENOUS
  Filled 2016-05-23 (×3): qty 100

## 2016-05-23 MED ORDER — DIAZEPAM 5 MG/ML IJ SOLN: 1.0000 mg | Freq: Once | INTRAMUSCULAR | Status: DC

## 2016-05-23 MED ORDER — LORAZEPAM 2 MG PO TABS: 0.0000 mg | ORAL_TABLET | Freq: Two times a day (BID) | ORAL | Status: DC

## 2016-05-23 MED ORDER — ACETAMINOPHEN 325 MG PO TABS: 650.0000 mg | ORAL_TABLET | Freq: Four times a day (QID) | ORAL | Status: DC | PRN

## 2016-05-23 NOTE — ED Notes (Signed)
Pt unable to tolerate oral contrast, per mom.  Dr. Dolores FrameSung notified and acknowledged.  EDP told this RN to call CT to perform CT without oral contrast at this time.

## 2016-05-23 NOTE — ED Notes (Signed)
Upon arrival into room, pt diaphoretic, moaning and screaming in pain.  Pt clutching abdomen and trying to vomit, but only spitting up saliva.  Suction set up at bedside to remove saliva from patient's mouth.  Pt lethargic and disoriented at this time.  Pt unable to give much of a back story, but states that he has been having stomach pain, n/v/d since Friday.  EDP at bedside to assess patient as well.

## 2016-05-23 NOTE — ED Provider Notes (Signed)
[facility] Emergency Department Provider Note   ____________________________________________   First MD Initiated Contact with Patient 05/23/16 0011     (approximate)  I have reviewed the triage vital signs and the nursing notes.   HISTORY  Chief Complaint Abdominal Pain; Diarrhea; and Emesis    HPI Jose Mays is a 20 y.o. male who presents to the ED from home with a chief complaint of abdominal pain, nausea, vomiting and diarrhea. Reports onset of symptoms 6 days. States he has had over 20 episodes of vomiting and diarrhea daily for the past 6 days. Symptoms associated with generalized abdominal cramping. Denies associated fever, chills, chest pain, shortness of breath, dysuria. Denies recent travel or trauma. Nothing makes his symptoms better or worse.   Past Medical History:  Diagnosis Date  . Abscess     Patient Active Problem List   Diagnosis Date Noted  . Hypokalemia 05/23/2016  . Nausea & vomiting 05/23/2016    Past Surgical History:  Procedure Laterality Date  . TONSILLECTOMY      Prior to Admission medications   Medication Sig Start Date End Date Taking? Authorizing Provider  oxyCODONE-acetaminophen (ROXICET) 5-325 MG tablet Take 1 tablet by mouth every 8 (eight) hours as needed for moderate pain or severe pain. Patient not taking: Reported on 05/10/2016 05/09/16   Charlesetta Ivory Menshew, PA-C    Allergies Review of patient's allergies indicates no known allergies.  Family History  Problem Relation Age of Onset  . Prostate cancer Paternal Uncle   . Rheum arthritis Paternal Grandmother   . Bladder Cancer Neg Hx     Social History Social History  Substance Use Topics  . Smoking status: Current Every Day Smoker  . Smokeless tobacco: Never Used  . Alcohol use No  Admits to marijuana use.  Review of Systems  Constitutional: No fever/chills. Eyes: No visual changes. ENT: No sore throat. Cardiovascular: Denies  chest pain. Respiratory: Denies shortness of breath. Gastrointestinal: Positive for abdominal pain, nausea, vomiting and diarrhea.  No constipation. Genitourinary: Negative for dysuria. Musculoskeletal: Negative for back pain. Skin: Negative for rash. Neurological: Negative for headaches, focal weakness or numbness.  10-point ROS otherwise negative.  ____________________________________________   PHYSICAL EXAM:  VITAL SIGNS: ED Triage Vitals  Enc Vitals Group     BP 05/22/16 2356 (!) 136/104     Pulse Rate 05/22/16 2356 98     Resp 05/22/16 2356 (!) 24     Temp 05/22/16 2356 97.6 F (36.4 C)     Temp Source 05/22/16 2356 Oral     SpO2 05/22/16 2356 100 %     Weight 05/22/16 2357 166 lb (75.3 kg)     Height 05/22/16 2357 5\' 6"  (1.676 m)     Head Circumference --      Peak Flow --      Pain Score 05/22/16 2357 10     Pain Loc --      Pain Edu? --      Excl. in GC? --     Constitutional: Alert and oriented. Well appearing and in moderate acute distress. Eyes: Conjunctivae are normal. PERRL. EOMI. Head: Atraumatic. Nose: No congestion/rhinnorhea. Mouth/Throat: Mucous membranes are dry.  Oropharynx non-erythematous. Neck: No stridor.   Cardiovascular: Normal rate, regular rhythm. Grossly normal heart sounds.  Good peripheral circulation. Respiratory: Normal respiratory effort.  No retractions. Lungs CTAB. Gastrointestinal: Soft and mildly diffusely tender to palpation. No distention. No abdominal bruits. No CVA tenderness. Genitourinary: Circumcised male. Bilaterally distended  testicles which are nontender. Left side examined which is without fluctuance, warmth or erythema. Strong bilateral cremasteric reflexes. Musculoskeletal: No lower extremity tenderness nor edema.  No joint effusions. Neurologic:  Normal speech and language. No gross focal neurologic deficits are appreciated.  Skin:  Skin is warm, diaphoretic and intact. No rash noted. Psychiatric: Mood and affect are  normal. Speech and behavior are normal.  ____________________________________________   LABS (all labs ordered are listed, but only abnormal results are displayed)  Labs Reviewed  GLUCOSE, CAPILLARY - Abnormal; Notable for the following:       Result Value   Glucose-Capillary 161 (*)    All other components within normal limits  CBC WITH DIFFERENTIAL/PLATELET - Abnormal; Notable for the following:    WBC 11.8 (*)    Neutro Abs 9.5 (*)    All other components within normal limits  COMPREHENSIVE METABOLIC PANEL - Abnormal; Notable for the following:    Potassium 3.0 (*)    CO2 21 (*)    Glucose, Bld 160 (*)    Anion gap 17 (*)    All other components within normal limits  ETHANOL - Abnormal; Notable for the following:    Alcohol, Ethyl (B) 6 (*)    All other components within normal limits  URINALYSIS COMPLETEWITH MICROSCOPIC ([facility] ONLY) - Abnormal; Notable for the following:    Color, Urine YELLOW (*)    APPearance CLEAR (*)    Glucose, UA 150 (*)    Ketones, ur TRACE (*)    Specific Gravity, Urine 1.051 (*)    Squamous Epithelial / LPF 0-5 (*)    All other components within normal limits  BLOOD GAS, VENOUS - Abnormal; Notable for the following:    pO2, Ven <31.0 (*)    Bicarbonate 30.9 (*)    Acid-Base Excess 4.7 (*)    All other components within normal limits  LIPASE, BLOOD  URINE DRUG SCREEN, QUALITATIVE ([facility] ONLY)  MAGNESIUM   ____________________________________________  EKG  ED ECG REPORT I, Eilleen Davoli J, the attending physician, personally viewed and interpreted this ECG.   Date: 05/23/2016  EKG Time: 0007  Rate: 106  Rhythm: normal EKG, normal sinus rhythm, unchanged from previous tracings, sinus tachycardia  Axis: Normal  Intervals:none  ST&T Change: Nonspecific  ____________________________________________  RADIOLOGY  CT abdomen and pelvis with contrast interpreted per Dr. Karie Kirks: No acute intra-abdominal or pelvic process. Normal  appendix. ____________________________________________   PROCEDURES  Procedure(s) performed: None  Procedures  Critical Care performed: No  ____________________________________________   INITIAL IMPRESSION / ASSESSMENT AND PLAN / ED COURSE  Pertinent labs & imaging results that were available during my care of the patient were reviewed by me and considered in my medical decision making (see chart for details).  20 year old male who presents with a 6 day history of generalized abdominal pain, nausea, vomiting and diarrhea. He is diaphoretic, dry heaving and in moderate distress. Review of chart demonstrates patient has a history of recurrent scrotal abscesses and had an I&D performed on his left scrotum by urology on 10/13. At this time he is in too much distress for examination of his scrotum. Will initiate IV fluid resuscitation, analgesia, antiemetic and perform GU examination.  Clinical Course  Comment By Time  Mother at bedside. Reports patient is "dramatic". States he did complete his course of antibiotic status post most recent I&D of scrotal abscess. Patient had immediate relief after morphine and Zofran and is currently resting comfortably. He is no longer diaphoretic. Able to perform GU  exam. Irean HongJade J Davon Folta, MD 10/26 0101  Updated patient and mother of CT imaging results. Patient denies daily, heavy drinking or recent binge drinking. He attempted to sip on ginger ale and is currently dry heaving. Will administer D5 half normal saline along with additional Zofran. Irean HongJade J Rossi Burdo, MD 10/26 [phone]  Patient again diaphoretic, not tachycardic nor hypertensive but complaining of abdominal cramping again. He is afebrile. Clinically suspect signs of alcohol withdrawal. Will administer IV Ativan and reassess. Irean HongJade J Remer Couse, MD 10/26 0350     ____________________________________________   FINAL CLINICAL IMPRESSION(S) / ED DIAGNOSES  Final diagnoses:  Nausea vomiting and diarrhea   Intractable vomiting with nausea, unspecified vomiting type  Metabolic acidosis  Hypokalemia      NEW MEDICATIONS STARTED DURING THIS VISIT:  Current Discharge Medication List       Note:  This document was prepared using Dragon voice recognition software and may include unintentional dictation errors.    Irean HongJade J Charidy Cappelletti, MD 05/23/16 [phone]

## 2016-05-23 NOTE — ED Notes (Signed)
Pt is restless and sweating pooling on forehead. MD aware ordering ativan.

## 2016-05-23 NOTE — ED Notes (Signed)
Pt unable to tolerate oral contrast at this time.  EDP notified.

## 2016-05-23 NOTE — ED Notes (Signed)
Admitting MD at bedside.

## 2016-05-23 NOTE — Progress Notes (Signed)
Poor po intake, still complain of nausea,    1. N/v 2. etoh abuse 3. hypok 4. hypomag  Supportive measure, ciwa, pepcid, replace lytes liekly dc tomorrow

## 2016-05-23 NOTE — H&P (Signed)
[facility] [place] at [facility]   PATIENT NAME: Jose Mays    MR#:  [id number]  DATE OF BIRTH:  07-21-1996  DATE OF ADMISSION:  05/22/2016  PRIMARY CARE PHYSICIAN: No PCP Per Patient   REQUESTING/REFERRING PHYSICIAN:   CHIEF COMPLAINT:   Chief Complaint  Patient presents with  . Abdominal Pain  . Diarrhea  . Emesis    HISTORY OF PRESENT ILLNESS: Jose Mays  is a 19 y.o. male with no significant past medical history presented to the emergency room with nausea and vomiting. Patient has his nausea and vomiting going on for the last 1 week. Not able to eat food and drink fluids secondary to the nausea. Patient drinks alcohol on a regular basis. He was accompanied by his mother to the emergency room. Patient was worked up with CT abdomen which showed no obstruction. He received IV Pepcid in the emergency room for possible gastritis secondary to alcohol use. No history of any vomiting of blood or hemoptysis. No history of any rectal bleed. Has mild abdominal discomfort is aching in nature 4 out of 10 on a scale of 1-10. No complaints of any chest pain, shortness of breath. No history of headache dizziness blurry vision. No history of any syncope or seizure.  PAST MEDICAL HISTORY:   Past Medical History:  Diagnosis Date  . Abscess     PAST SURGICAL HISTORY: Past Surgical History:  Procedure Laterality Date  . TONSILLECTOMY      SOCIAL HISTORY:  Social History  Substance Use Topics  . Smoking status: Current Every Day Smoker  . Smokeless tobacco: Never Used  . Alcohol use No    FAMILY HISTORY:  Family History  Problem Relation Age of Onset  . Prostate cancer Paternal Uncle   . Rheum arthritis Paternal Grandmother   . Bladder Cancer Neg Hx     DRUG ALLERGIES: No Known Allergies  REVIEW OF SYSTEMS:   CONSTITUTIONAL: No fever, fatigue or weakness.  EYES: No blurred or double vision.  EARS, NOSE, AND THROAT: No tinnitus or ear pain.   RESPIRATORY: No cough, shortness of breath, wheezing or hemoptysis.  CARDIOVASCULAR: No chest pain, orthopnea, edema.  GASTROINTESTINAL: Has nausea, vomiting, and abdominal pain.  One episode of diarrhoea. GENITOURINARY: No dysuria, hematuria.  ENDOCRINE: No polyuria, nocturia,  HEMATOLOGY: No anemia, easy bruising or bleeding SKIN: No rash or lesion. MUSCULOSKELETAL: No joint pain or arthritis.   NEUROLOGIC: No tingling, numbness, weakness.  PSYCHIATRY: No anxiety or depression.   MEDICATIONS AT HOME:  Prior to Admission medications   Medication Sig Start Date End Date Taking? Authorizing Provider  oxyCODONE-acetaminophen (ROXICET) 5-325 MG tablet Take 1 tablet by mouth every 8 (eight) hours as needed for moderate pain or severe pain. Patient not taking: Reported on 05/10/2016 05/09/16   Charlesetta Ivory Menshew, PA-C      PHYSICAL EXAMINATION:   VITAL SIGNS: Blood pressure 120/66, pulse (!) 57, temperature 97.7 F (36.5 C), temperature source Oral, resp. rate 19, height 5\' 6"  (1.676 m), weight 75.3 kg (166 lb), SpO2 98 %.  GENERAL:  20 y.o.-year-old patient lying in the bed with no acute distress.  EYES: Pupils equal, round, reactive to light and accommodation. No scleral icterus. Extraocular muscles intact.  HEENT: Head atraumatic, normocephalic. Oropharynx dry and nasopharynx clear.  NECK:  Supple, no jugular venous distention. No thyroid enlargement, no tenderness.  LUNGS: Normal breath sounds bilaterally, no wheezing, rales,rhonchi or crepitation. No use of accessory muscles of respiration.  CARDIOVASCULAR: S1, S2 normal. No murmurs, rubs, or gallops.  ABDOMEN: Soft, nontender, nondistended. Bowel sounds present. No organomegaly or mass.  EXTREMITIES: No pedal edema, cyanosis, or clubbing.  NEUROLOGIC: Cranial nerves II through XII are intact. Muscle strength 5/5 in all extremities. Sensation intact. Gait not checked.  PSYCHIATRIC: The patient is alert and oriented x 3 SKIN:  No obvious rash, lesion, or ulcer.   LABORATORY PANEL:   CBC  Recent Labs Lab 05/23/16 0010  WBC 11.8*  HGB 15.2  HCT 45.1  PLT 379  MCV 94.2  MCH 31.7  MCHC 33.7  RDW 12.8  LYMPHSABS 1.5  MONOABS 0.7  EOSABS 0.0  BASOSABS 0.1   ------------------------------------------------------------------------------------------------------------------  Chemistries   Recent Labs Lab 05/23/16 0010  NA 140  K 3.0*  CL 102  CO2 21*  GLUCOSE 160*  BUN 9  CREATININE 1.02  CALCIUM 9.4  AST 35  ALT 18  ALKPHOS 83  BILITOT 0.8   ------------------------------------------------------------------------------------------------------------------ estimated creatinine clearance is 103.4 mL/min (by C-G formula based on SCr of 1.02 mg/dL). ------------------------------------------------------------------------------------------------------------------ No results for input(s): TSH, T4TOTAL, T3FREE, THYROIDAB in the last 72 hours.  Invalid input(s): FREET3   Coagulation profile No results for input(s): INR, PROTIME in the last 168 hours. ------------------------------------------------------------------------------------------------------------------- No results for input(s): DDIMER in the last 72 hours. -------------------------------------------------------------------------------------------------------------------  Cardiac Enzymes No results for input(s): CKMB, TROPONINI, MYOGLOBIN in the last 168 hours.  Invalid input(s): CK ------------------------------------------------------------------------------------------------------------------ Invalid input(s): POCBNP  ---------------------------------------------------------------------------------------------------------------  Urinalysis    Component Value Date/Time   COLORURINE YELLOW (A) 05/08/2016 2104   APPEARANCEUR HAZY (A) 05/08/2016 2104   LABSPEC 1.033 (H) 05/08/2016 2104   PHURINE 5.0 05/08/2016 2104   GLUCOSEU  NEGATIVE 05/08/2016 2104   HGBUR NEGATIVE 05/08/2016 2104   BILIRUBINUR NEGATIVE 05/08/2016 2104   KETONESUR NEGATIVE 05/08/2016 2104   PROTEINUR 100 (A) 05/08/2016 2104   NITRITE NEGATIVE 05/08/2016 2104   LEUKOCYTESUR 2+ (A) 05/08/2016 2104     RADIOLOGY: Ct Abdomen Pelvis W Contrast  Result Date: 05/23/2016 CLINICAL DATA:  Abdominal pain, nausea, vomiting and diarrhea for 5 days. Recent scrotal abscess. EXAM: CT ABDOMEN AND PELVIS WITH CONTRAST TECHNIQUE: Multidetector CT imaging of the abdomen and pelvis was performed using the standard protocol following bolus administration of intravenous contrast. CONTRAST:  ISOVUE-300 IOPAMIDOL (ISOVUE-300) INJECTION 61% COMPARISON:  Scrotal ultrasound May 08, 2016 FINDINGS: LOWER CHEST: Lung bases are clear. Included heart size is normal. No pericardial effusion. Gas within nondistended distal esophagus associated with reflux. HEPATOBILIARY: Liver and gallbladder are normal. PANCREAS: Normal. SPLEEN: Normal. ADRENALS/URINARY TRACT: Kidneys are orthotopic, demonstrating symmetric enhancement. No nephrolithiasis, hydronephrosis or solid renal masses. The unopacified ureters are normal in course and caliber. Delayed imaging through the kidneys demonstrates symmetric prompt contrast excretion within the proximal urinary collecting system. Urinary bladder is partially distended and unremarkable. Normal adrenal glands. STOMACH/BOWEL: Possible gastric cardia diverticulum the stomach decompressed. The stomach, small and large bowel are normal in course and caliber without inflammatory changes. Normal appendix. VASCULAR/LYMPHATIC: Aortoiliac vessels are normal in course and caliber. Small mesenteric lymph nodes are likely reactive. No lymphadenopathy by CT size criteria. REPRODUCTIVE: Normal.  Included scrotum is unremarkable. OTHER: No intraperitoneal free fluid or free air. MUSCULOSKELETAL: Nonacute. Small bilateral fat containing inguinal hernias with fat  stranding. Prominent reniform inguinal lymph nodes are likely reactive. IMPRESSION: No acute intra-abdominal or pelvic process.  Normal appendix. Electronically Signed   By: Awilda Metro M.D.   On: 05/23/2016 02:11    EKG: No orders found  for this or any previous visit.  IMPRESSION AND PLAN:  20 year old male patient who has history of alcohol abuse presented to the emergency room with nausea and vomiting and abdominal discomfort.  Admitting diagnosis 1. Intractable nausea vomiting 2. Alcoholic gastritis 3. Alcohol abuse 4. Hypokalemia  Treatment plan Admit patient to medical floor observation bed IV fluid hydration with thiamine and folate acid supplementation ciwa protocol for alcohol withdrawal Supplement potassium Supportive care.  All the records are reviewed and case discussed with ED provider. Management plans discussed with the patient, family and they are in agreement.  CODE STATUS:FULL Code Status History    This patient does not have a recorded code status. Please follow your organizational policy for patients in this situation.       TOTAL TIME TAKING CARE OF THIS PATIENT: 52 minutes.    Ihor AustinPavan Baleria Wyman M.D on 05/23/2016 at 5:18 AM  Between 7am to 6pm - Pager - [phone]  After 6pm go to [email or web address] - password [email or web address] [facility]  [facility]  Office  [phone]  CC: Primary care physician; No PCP Per Patient

## 2016-05-23 NOTE — ED Notes (Signed)
Pt attempted to urinate unsuccessful. After Vomit green color. Md made aware.

## 2016-05-24 LAB — BASIC METABOLIC PANEL
ANION GAP: 5 (ref 5–15)
BUN: 6 mg/dL (ref 6–20)
CALCIUM: 8 mg/dL — AB (ref 8.9–10.3)
CO2: 31 mmol/L (ref 22–32)
Chloride: 105 mmol/L (ref 101–111)
Creatinine, Ser: 0.99 mg/dL (ref 0.61–1.24)
GLUCOSE: 91 mg/dL (ref 65–99)
POTASSIUM: 3.4 mmol/L — AB (ref 3.5–5.1)
Sodium: 141 mmol/L (ref 135–145)

## 2016-05-24 LAB — HEMOGLOBIN A1C
HEMOGLOBIN A1C: 4.8 % (ref 4.8–5.6)
MEAN PLASMA GLUCOSE: 91 mg/dL

## 2016-05-24 MED ORDER — OMEPRAZOLE 20 MG PO CPDR: 20.0000 mg | DELAYED_RELEASE_CAPSULE | Freq: Every day | ORAL | 0 refills | Status: DC

## 2016-05-24 NOTE — Discharge Summary (Signed)
[facility] [place] at [facility]   PATIENT NAME: Jose Mays    MR#:  [id number]  DATE OF BIRTH:  04/25/1996  DATE OF ADMISSION:  05/22/2016 ADMITTING PHYSICIAN: Ihor AustinPavan Pyreddy, MD  DATE OF DISCHARGE: 05/24/16  PRIMARY CARE PHYSICIAN: No PCP Per Patient    ADMISSION DIAGNOSIS:  Hypokalemia [E87.6] Metabolic acidosis [E87.2] Nausea vomiting and diarrhea [R11.2, R19.7] Intractable vomiting with nausea, unspecified vomiting type [R11.2]  DISCHARGE DIAGNOSIS:  Intractable nausea/vomiting gastritis  SECONDARY DIAGNOSIS:   Past Medical History:  Diagnosis Date  . Abscess     HOSPITAL COURSE:  Jose Mays  is a 20 y.o. male admitted 05/22/2016 with chief complaint Abdominal Pain; and Emesis . Please see H&P performed by Ihor AustinPavan Pyreddy, MD for further information. Patient presented with the above symptoms likely related to viral gastroenteritis or alcohol related gastritis. He received supportive care with improvement of symptoms. I will place him on a PPI for continued GI protection can be stopped in 1 month if no more symptoms.  DISCHARGE CONDITIONS:   stable  CONSULTS OBTAINED:    DRUG ALLERGIES:  No Known Allergies  DISCHARGE MEDICATIONS:   Current Discharge Medication List    START taking these medications   Details  omeprazole (PRILOSEC) 20 MG capsule Take 1 capsule (20 mg total) by mouth daily. Qty: 30 capsule, Refills: 0         DISCHARGE INSTRUCTIONS:    DIET:  Regular diet  DISCHARGE CONDITION:  Stable  ACTIVITY:  Activity as tolerated  OXYGEN:  Home Oxygen: No.   Oxygen Delivery: room air  DISCHARGE LOCATION:  home   If you experience worsening of your admission symptoms, develop shortness of breath, life threatening emergency, suicidal or homicidal thoughts you must seek medical attention immediately by calling 911 or calling your MD immediately  if symptoms less severe.  You Must read complete  instructions/literature along with all the possible adverse reactions/side effects for all the Medicines you take and that have been prescribed to you. Take any new Medicines after you have completely understood and accpet all the possible adverse reactions/side effects.   Please note  You were cared for by a hospitalist during your hospital stay. If you have any questions about your discharge medications or the care you received while you were in the hospital after you are discharged, you can call the unit and asked to speak with the hospitalist on call if the hospitalist that took care of you is not available. Once you are discharged, your primary care physician will handle any further medical issues. Please note that NO REFILLS for any discharge medications will be authorized once you are discharged, as it is imperative that you return to your primary care physician (or establish a relationship with a primary care physician if you do not have one) for your aftercare needs so that they can reassess your need for medications and monitor your lab values.    On the day of Discharge:   VITAL SIGNS:  Blood pressure (!) 98/59, pulse (!) 47, temperature 97.7 F (36.5 C), temperature source Oral, resp. rate 18, height 5\' 5"  (1.651 m), weight 72.9 kg (160 lb 12.8 oz), SpO2 100 %.  I/O:   Intake/Output Summary (Last 24 hours) at 05/24/16 0917 Last data filed at 05/24/16 0541  Gross per 24 hour  Intake          1404.58 ml  Output  250 ml  Net          1154.58 ml    PHYSICAL EXAMINATION:  GENERAL:  20 y.o.-year-old patient lying in the bed with no acute distress.  EYES: Pupils equal, round, reactive to light and accommodation. No scleral icterus. Extraocular muscles intact.  HEENT: Head atraumatic, normocephalic. Oropharynx and nasopharynx clear.  NECK:  Supple, no jugular venous distention. No thyroid enlargement, no tenderness.  LUNGS: Normal breath sounds bilaterally, no wheezing,  rales,rhonchi or crepitation. No use of accessory muscles of respiration.  CARDIOVASCULAR: S1, S2 normal. No murmurs, rubs, or gallops.  ABDOMEN: Soft, non-tender, non-distended. Bowel sounds present. No organomegaly or mass.  EXTREMITIES: No pedal edema, cyanosis, or clubbing.  NEUROLOGIC: Cranial nerves II through XII are intact. Muscle strength 5/5 in all extremities. Sensation intact. Gait not checked.  PSYCHIATRIC: The patient is alert and oriented x 3.  SKIN: No obvious rash, lesion, or ulcer.   DATA REVIEW:   CBC  Recent Labs Lab 05/23/16 0010  WBC 11.8*  HGB 15.2  HCT 45.1  PLT 379    Chemistries   Recent Labs Lab 05/23/16 0010 05/24/16 0509  NA 140 141  K 3.0* 3.4*  CL 102 105  CO2 21* 31  GLUCOSE 160* 91  BUN 9 6  CREATININE 1.02 0.99  CALCIUM 9.4 8.0*  MG 1.5*  --   AST 35  --   ALT 18  --   ALKPHOS 83  --   BILITOT 0.8  --     Cardiac Enzymes No results for input(s): TROPONINI in the last 168 hours.  Microbiology Results  Results for orders placed or performed during the hospital encounter of 07/23/13  Culture, routine-abscess     Status: None   Collection Time: 07/23/13  4:55 PM  Result Value Ref Range Status   Specimen Description ABSCESS SCROTUM RIGHT  Final   Special Requests NONE  Final   Gram Stain   Final    MODERATE WBC PRESENT,BOTH PMN AND MONONUCLEAR NO SQUAMOUS EPITHELIAL CELLS SEEN MODERATE GRAM POSITIVE COCCI IN PAIRS MODERATE GRAM NEGATIVE RODS Performed at [facility]   Culture   Final    RARE STREPTOCOCCUS GROUP C Performed at [facility]   Report Status 07/26/2013 FINAL  Final    RADIOLOGY:  Ct Abdomen Pelvis W Contrast  Result Date: 05/23/2016 CLINICAL DATA:  Abdominal pain, nausea, vomiting and diarrhea for 5 days. Recent scrotal abscess. EXAM: CT ABDOMEN AND PELVIS WITH CONTRAST TECHNIQUE: Multidetector CT imaging of the abdomen and pelvis was performed using the standard protocol following bolus  administration of intravenous contrast. CONTRAST:  ISOVUE-300 IOPAMIDOL (ISOVUE-300) INJECTION 61% COMPARISON:  Scrotal ultrasound May 08, 2016 FINDINGS: LOWER CHEST: Lung bases are clear. Included heart size is normal. No pericardial effusion. Gas within nondistended distal esophagus associated with reflux. HEPATOBILIARY: Liver and gallbladder are normal. PANCREAS: Normal. SPLEEN: Normal. ADRENALS/URINARY TRACT: Kidneys are orthotopic, demonstrating symmetric enhancement. No nephrolithiasis, hydronephrosis or solid renal masses. The unopacified ureters are normal in course and caliber. Delayed imaging through the kidneys demonstrates symmetric prompt contrast excretion within the proximal urinary collecting system. Urinary bladder is partially distended and unremarkable. Normal adrenal glands. STOMACH/BOWEL: Possible gastric cardia diverticulum the stomach decompressed. The stomach, small and large bowel are normal in course and caliber without inflammatory changes. Normal appendix. VASCULAR/LYMPHATIC: Aortoiliac vessels are normal in course and caliber. Small mesenteric lymph nodes are likely reactive. No lymphadenopathy by CT size criteria. REPRODUCTIVE: Normal.  Included scrotum is  unremarkable. OTHER: No intraperitoneal free fluid or free air. MUSCULOSKELETAL: Nonacute. Small bilateral fat containing inguinal hernias with fat stranding. Prominent reniform inguinal lymph nodes are likely reactive. IMPRESSION: No acute intra-abdominal or pelvic process.  Normal appendix. Electronically Signed   By: Awilda Metro M.D.   On: 05/23/2016 02:11     Management plans discussed with the patient, family and they are in agreement.  CODE STATUS:     Code Status Orders        Start     Ordered   05/23/16 1610  Full code  Continuous     05/23/16 0631    Code Status History    Date Active Date Inactive Code Status Order ID Comments User Context   This patient has a current code status but no  historical code status.      TOTAL TIME TAKING CARE OF THIS PATIENT: 33 minutes.    Jose Mays,  Mardi Mainland.D on 05/24/2016 at 9:17 AM  Between 7am to 6pm - Pager - [phone]  After 6pm go to [email or web address] - Scientist, research (life sciences) [facility]  Office  [phone]  CC: Primary care physician; No PCP Per Patient

## 2016-05-24 NOTE — Progress Notes (Signed)
   [facility] [address] [place], [place] [address]  May 24, 2016  Patient:  Jose Mays Date of Birth: 02/04/1996 Date of Visit:  05/22/2016  To Whom it May Concern:  Please excuse Jose Mays from work from 05/22/2016 until 05/24/16 as he was admitted to the [facility] for medical treatment and has been receiving appropriate care. He may return to work on 05/26/16, sooner if he feels he is able to return sooner than this date.      Please don't hesitate to contact me with questions or concerns by calling  [phone] and asking them to page me directly.   Marge Duncansave Laquitta Dominski, MD

## 2016-05-24 NOTE — Progress Notes (Signed)
Patient discharged to home as ordered, Patient is alert and oriented, no acute distress noted. Patient given discharge instruction as ordered and instructed [facility]. Patient instructed to pick up prescription at his pharmacy of file. Vital Signs stable no acute distress noted.

## 2016-05-27 ENCOUNTER — Ambulatory Visit (INDEPENDENT_AMBULATORY_CARE_PROVIDER_SITE_OTHER): Payer: Self-pay | Admitting: Urology

## 2016-05-27 ENCOUNTER — Encounter: Payer: Self-pay | Admitting: Urology

## 2016-05-27 VITALS — BP 85/52 | HR 45 | Ht 65.0 in | Wt 168.0 lb

## 2016-05-27 DIAGNOSIS — R780 Finding of alcohol in blood: Secondary | ICD-10-CM

## 2016-05-27 DIAGNOSIS — N492 Inflammatory disorders of scrotum: Secondary | ICD-10-CM

## 2016-05-27 MED ORDER — CHLORHEXIDINE GLUCONATE 4 % EX LIQD: Freq: Every day | CUTANEOUS | 3 refills | Status: DC | PRN

## 2016-05-27 NOTE — Progress Notes (Signed)
05/27/2016 9:40 AM   Jose Mays Jose Mays 09/18/1995 [id number]  Referring provider: [facility] [address] [place], [place] [address]  Chief Complaint  Patient presents with  . Follow-up    Scrotal abscess    HPI: 20 year old [place] male with recurrent scrotal abscesses who returns to the office today for reevaluation.   He last underwent scrotal I&D in the emergency room on 04/02/2016 for left-sided hemiscrotal abscess which tracked towards his suprapubic area.  He returned again to the emergency room overnight on 05/08/2016 with recurrent abscess. He was otherwise clinically stable and discharged with a plan for I&D in the office yesterday, but had to reschedule for today. He states that the abscess started draining spontaneously from the previous incision site shortly after he was in the emergency room. He did receive antibiotics in the emergency room but never picked up his prescription and has not been taking them.  He denies any fevers or chills. His scrotal pain has improved since the abscess spontaneously drained.  He presents today for a follow up visit.  He has not had any issues at this time with his scrotum.  He denies scrotal pain, scrotal swelling or scrotal drainage.  He has not had fevers, chills, nausea or vomiting.    He was seen in the ED recently and diagnosed with alcohol induced gastritis.     PMH: Past Medical History:  Diagnosis Date  . Abscess     Surgical History: Past Surgical History:  Procedure Laterality Date  . TONSILLECTOMY      Home Medications:    Medication List       Accurate as of 05/27/16  9:40 AM. Always use your most recent med list.          omeprazole 20 MG capsule Commonly known as:  PRILOSEC Take 1 capsule (20 mg total) by mouth daily.       Allergies: No Known Allergies  Family History: Family History  Problem Relation Age of Onset  . Prostate cancer Paternal Uncle   . Rheum arthritis  Paternal Grandmother   . Bladder Cancer Neg Hx     Social History:  reports that he has been smoking.  He has never used smokeless tobacco. He reports that he does not drink alcohol or use drugs.  ROS: UROLOGY Frequent Urination?: No Hard to postpone urination?: No Burning/pain with urination?: No Get up at night to urinate?: No Leakage of urine?: No Urine stream starts and stops?: No Trouble starting stream?: No Do you have to strain to urinate?: No Blood in urine?: No Urinary tract infection?: No Sexually transmitted disease?: No Injury to kidneys or bladder?: No Painful intercourse?: No Weak stream?: No Erection problems?: No Penile pain?: No  Gastrointestinal Nausea?: No Vomiting?: No Indigestion/heartburn?: No Diarrhea?: No Constipation?: No  Constitutional Fever: No Night sweats?: No Weight loss?: Yes Fatigue?: No  Skin Skin rash/lesions?: No Itching?: No  Eyes Blurred vision?: No Double vision?: No  Ears/Nose/Throat Sore throat?: No Sinus problems?: No  Hematologic/Lymphatic Swollen glands?: No Easy bruising?: No  Cardiovascular Leg swelling?: No Chest pain?: No  Respiratory Cough?: No Shortness of breath?: No  Endocrine Excessive thirst?: No  Musculoskeletal Back pain?: No Joint pain?: No  Neurological Headaches?: No Dizziness?: No  Psychologic Depression?: No Anxiety?: No  Physical Exam: BP (!) 85/52 (BP Location: Left Arm, Patient Position: Sitting, Cuff Size: Normal)   Pulse (!) 45   Ht 5\' 5"  (1.651 m)   Wt 168 lb (76.2 kg)  BMI 27.96 kg/m   Constitutional:  Alert and oriented, No acute distress. HEENT: [place] AT, moist mucus membranes.  Trachea midline, no masses. Cardiovascular: No clubbing, cyanosis, or edema. Respiratory: Normal respiratory effort, no increased work of breathing. GI: Abdomen is soft, nontender, nondistended, no abdominal masses GU: No induration seen of the left lateral hemiscrotum with no opening in  the skin at the site of previous I&D incision site. No pus was elicited upon palpation.  There was no induration tracking superiorly and at the base of the penis superiorly. No erythema. Skin: No rashes, bruises or suspicious lesions. Lymph: No cervical or inguinal adenopathy. Neurologic: Grossly intact, no focal deficits, moving all 4 extremities. Psychiatric: Normal mood and affect.  Laboratory Data: Lab Results  Component Value Date   WBC 11.8 (H) 05/23/2016   HGB 15.2 05/23/2016   HCT 45.1 05/23/2016   MCV 94.2 05/23/2016   PLT 379 05/23/2016    Lab Results  Component Value Date   CREATININE 0.99 05/24/2016     Pertinent Imaging: CLINICAL DATA:  Recurrent left scrotal abscess/ cellulitis for 3 days.  EXAM: SCROTAL ULTRASOUND  DOPPLER ULTRASOUND OF THE TESTICLES  TECHNIQUE: Complete ultrasound examination of the testicles, epididymis, and other scrotal structures was performed. Color and spectral Doppler ultrasound were also utilized to evaluate blood flow to the testicles.  COMPARISON:  04/02/2016 scrotal ultrasound  FINDINGS: Right testicle  Measurements: 4.2 x 2.2 x 2.6 cm. No mass or microlithiasis visualized.  Left testicle  Measurements: 5.0 x 2.6 x 2.8 cm. No mass or microlithiasis visualized.  Right epididymis:  Normal in size and appearance.  Left epididymis:  Normal in size and appearance.  Hydrocele:  None visualized.  Varicocele:  None visualized.  Pulsed Doppler interrogation of both testes demonstrates normal low resistance arterial and venous waveforms bilaterally.  Other: Left lateral scrotal wall debris filled hypovascular collection with surrounding edema and hypervascularity measuring 3.6 x 2.5 x 3.5 cm.  IMPRESSION: In the area of interest in the left lateral scrotal wall there is a debris-filled fluid collection with surrounding edema and hyperemia measuring up to 3.6 cm probably representing an abscess. No  evidence for epididymo-orchitis or torsion at this time.   Electronically Signed   By: Mitzi HansenLance  Furusawa-Stratton M.D.   On: 05/08/2016 23:03   Assessment & Plan:    1. Scrotal abscess  - resolved  - advised to shower twice daily with Hibiclens  - RTC prn  2. Elevated ethanol level  - inquired with patient concerning his alcohol consumption- he states he does not drink alcohol regularly or excessively  - reviewed the dangers of excessive alcohol consumption with the patient   Return if symptoms worsen or fail to improve.  Michiel CowboySHANNON Marl Seago, PA-C  [facility] [address], [address] [place], [place] [address] [phone]

## 2017-05-13 ENCOUNTER — Encounter: Payer: Self-pay | Admitting: Internal Medicine

## 2017-05-13 ENCOUNTER — Observation Stay
Admission: EM | Admit: 2017-05-13 | Discharge: 2017-05-15 | Disposition: A | Discharge status: Home / Self Care | Payer: Self-pay | Attending: Internal Medicine | Admitting: Internal Medicine

## 2017-05-13 DIAGNOSIS — F17200 Nicotine dependence, unspecified, uncomplicated: Secondary | ICD-10-CM | POA: Insufficient documentation

## 2017-05-13 DIAGNOSIS — E876 Hypokalemia: Secondary | ICD-10-CM | POA: Insufficient documentation

## 2017-05-13 DIAGNOSIS — R112 Nausea with vomiting, unspecified: Secondary | ICD-10-CM

## 2017-05-13 DIAGNOSIS — F1910 Other psychoactive substance abuse, uncomplicated: Secondary | ICD-10-CM | POA: Insufficient documentation

## 2017-05-13 DIAGNOSIS — K2920 Alcoholic gastritis without bleeding: Secondary | ICD-10-CM | POA: Insufficient documentation

## 2017-05-13 DIAGNOSIS — R197 Diarrhea, unspecified: Secondary | ICD-10-CM

## 2017-05-13 DIAGNOSIS — Z23 Encounter for immunization: Secondary | ICD-10-CM | POA: Insufficient documentation

## 2017-05-13 DIAGNOSIS — Z79899 Other long term (current) drug therapy: Secondary | ICD-10-CM | POA: Insufficient documentation

## 2017-05-13 DIAGNOSIS — K529 Noninfective gastroenteritis and colitis, unspecified: Secondary | ICD-10-CM

## 2017-05-13 DIAGNOSIS — F1290 Cannabis use, unspecified, uncomplicated: Secondary | ICD-10-CM | POA: Insufficient documentation

## 2017-05-13 HISTORY — DX: Noninfective gastroenteritis and colitis, unspecified: K52.9

## 2017-05-13 LAB — COMPREHENSIVE METABOLIC PANEL
ALBUMIN: 4.2 g/dL (ref 3.5–5.0)
ALK PHOS: 84 U/L (ref 38–126)
ALT: 36 U/L (ref 17–63)
AST: 45 U/L — AB (ref 15–41)
Anion gap: 13 (ref 5–15)
BILIRUBIN TOTAL: 0.8 mg/dL (ref 0.3–1.2)
BUN: 10 mg/dL (ref 6–20)
CALCIUM: 8.9 mg/dL (ref 8.9–10.3)
CO2: 27 mmol/L (ref 22–32)
CREATININE: 1.07 mg/dL (ref 0.61–1.24)
Chloride: 98 mmol/L — ABNORMAL LOW (ref 101–111)
GFR calc Af Amer: >60 mL/min (ref >60)
GLUCOSE: 112 mg/dL — AB (ref 65–99)
POTASSIUM: 2.8 mmol/L — AB (ref 3.5–5.1)
Sodium: 138 mmol/L (ref 135–145)
TOTAL PROTEIN: 7.9 g/dL (ref 6.5–8.1)

## 2017-05-13 LAB — CBC
HCT: 47.3 % (ref 40.0–52.0)
Hemoglobin: 16.1 g/dL (ref 13.0–18.0)
MCH: 32.4 pg (ref 26.0–34.0)
MCHC: 34.2 g/dL (ref 32.0–36.0)
MCV: 94.8 fL (ref 80.0–100.0)
PLATELETS: 308 10*3/uL (ref 150–440)
RBC: 4.99 MIL/uL (ref 4.40–5.90)
RDW: 12.4 % (ref 11.5–14.5)
WBC: 6.4 10*3/uL (ref 3.8–10.6)

## 2017-05-13 LAB — LIPASE, BLOOD: LIPASE: 20 U/L (ref 11–51)

## 2017-05-13 LAB — TROPONIN I

## 2017-05-13 MED ORDER — ENOXAPARIN SODIUM 40 MG/0.4ML ~~LOC~~ SOLN
40.0000 mg | SUBCUTANEOUS | Status: DC
  Filled 2017-05-13 (×2): qty 0.4

## 2017-05-13 MED ORDER — HALOPERIDOL LACTATE 5 MG/ML IJ SOLN
2.5000 mg | Freq: Once | INTRAMUSCULAR | Status: DC
  Filled 2017-05-13: qty 1

## 2017-05-13 MED ORDER — SODIUM CHLORIDE 0.9 % IV SOLN: 8.0000 mg | Freq: Once | INTRAVENOUS | Status: DC

## 2017-05-13 MED ORDER — POTASSIUM CHLORIDE 10 MEQ/100ML IV SOLN
10.0000 meq | INTRAVENOUS | Status: AC
  Administered 2017-05-13 (×2): 10 meq via INTRAVENOUS
  Filled 2017-05-13 (×2): qty 100

## 2017-05-13 MED ORDER — OXYCODONE HCL 5 MG PO TABS: 5.0000 mg | ORAL_TABLET | ORAL | Status: DC | PRN

## 2017-05-13 MED ORDER — ONDANSETRON HCL 4 MG/2ML IJ SOLN
INTRAMUSCULAR | Status: AC
  Administered 2017-05-13: 4 mg via INTRAVENOUS
  Filled 2017-05-13: qty 4

## 2017-05-13 MED ORDER — POTASSIUM CHLORIDE 20 MEQ/15ML (10%) PO SOLN
40.0000 meq | Freq: Once | ORAL | Status: AC
  Administered 2017-05-13: 40 meq via ORAL
  Filled 2017-05-13: qty 30

## 2017-05-13 MED ORDER — HALOPERIDOL LACTATE 5 MG/ML IJ SOLN
5.0000 mg | Freq: Once | INTRAMUSCULAR | Status: AC
  Administered 2017-05-13: 5 mg via INTRAVENOUS

## 2017-05-13 MED ORDER — ONDANSETRON HCL 4 MG/2ML IJ SOLN
4.0000 mg | Freq: Four times a day (QID) | INTRAMUSCULAR | Status: DC | PRN
  Filled 2017-05-13: qty 2

## 2017-05-13 MED ORDER — ONDANSETRON HCL 4 MG PO TABS
4.0000 mg | ORAL_TABLET | Freq: Four times a day (QID) | ORAL | Status: DC | PRN
  Administered 2017-05-14: 4 mg via ORAL
  Filled 2017-05-13: qty 1

## 2017-05-13 MED ORDER — ACETAMINOPHEN 650 MG RE SUPP: 650.0000 mg | Freq: Four times a day (QID) | RECTAL | Status: DC | PRN

## 2017-05-13 MED ORDER — POTASSIUM CHLORIDE IN NACL 20-0.9 MEQ/L-% IV SOLN
INTRAVENOUS | Status: DC
  Administered 2017-05-13: 22:00:00 via INTRAVENOUS
  Filled 2017-05-13 (×6): qty 1000

## 2017-05-13 MED ORDER — ACETAMINOPHEN 325 MG PO TABS: 650.0000 mg | ORAL_TABLET | Freq: Four times a day (QID) | ORAL | Status: DC | PRN

## 2017-05-13 MED ORDER — INFLUENZA VAC SPLIT QUAD 0.5 ML IM SUSY
0.5000 mL | PREFILLED_SYRINGE | INTRAMUSCULAR | Status: AC
  Administered 2017-05-14: 0.5 mL via INTRAMUSCULAR
  Filled 2017-05-13 (×2): qty 0.5

## 2017-05-13 MED ORDER — ONDANSETRON HCL 4 MG/2ML IJ SOLN
4.0000 mg | Freq: Once | INTRAMUSCULAR | Status: AC
  Administered 2017-05-13: 4 mg via INTRAVENOUS

## 2017-05-13 MED ORDER — SODIUM CHLORIDE 0.9 % IV BOLUS (SEPSIS)
1000.0000 mL | Freq: Once | INTRAVENOUS | Status: AC
  Administered 2017-05-13: 1000 mL via INTRAVENOUS

## 2017-05-13 MED ORDER — PANTOPRAZOLE SODIUM 40 MG PO TBEC
40.0000 mg | DELAYED_RELEASE_TABLET | Freq: Every day | ORAL | Status: DC
Start: 2017-05-13 — End: 2017-05-15
  Administered 2017-05-13 – 2017-05-15 (×3): 40 mg via ORAL
  Filled 2017-05-13 (×3): qty 1

## 2017-05-13 MED ORDER — MORPHINE SULFATE (PF) 2 MG/ML IV SOLN: 2.0000 mg | INTRAVENOUS | Status: DC | PRN

## 2017-05-13 NOTE — ED Notes (Signed)
Pt bed was not assigned for 1 hour and 44 minutes. Can now call to give report.

## 2017-05-13 NOTE — H&P (Signed)
[facility] - [place] at [facility]   PATIENT NAME: Jose Mays    MR#:  [id number]  DATE OF BIRTH:  [birth year]  DATE OF ADMISSION:  05/13/2017  PRIMARY CARE PHYSICIAN: Patient, No Pcp Per   REQUESTING/REFERRING PHYSICIAN: Dr. Governor Rooks  CHIEF COMPLAINT:   Chief Complaint  Patient presents with  . Nausea  . Emesis  . Diarrhea    HISTORY OF PRESENT ILLNESS:  Jose Mays  is a 21 y.o. male with a known history of polysubstance abuse presents to hospital secondary to intractable nausea, vomiting and abdominal pain that started this morning. Patient had a similar admission one year ago and was noted to have acute gastroenteritis secondary to marijuana usage and also alcoholic gastritis. His last use of marijuana was yesterday and last drink of beer was yesterday morning. Denies doing any other drugs. Woke up this morning and later in the morning had abdominal pain radiating to back, associated with diaphoresis, nausea and vomiting. Also had couple of loose stools. No further diarrhea since being in the hospital. Abdomen is soft and exam. In spite of IV fluids, nausea medications, symptoms were still not controlled.  PAST MEDICAL HISTORY:   Past Medical History:  Diagnosis Date  . Abscess     PAST SURGICAL HISTORY:   Past Surgical History:  Procedure Laterality Date  . TONSILLECTOMY      SOCIAL HISTORY:   Social History  Substance Use Topics  . Smoking status: Current Every Day Smoker  . Smokeless tobacco: Never Used  . Alcohol use No    FAMILY HISTORY:   Family History  Problem Relation Age of Onset  . Prostate cancer Paternal Uncle   . Rheum arthritis Paternal Grandmother   . Bladder Cancer Neg Hx     DRUG ALLERGIES:  No Known Allergies  REVIEW OF SYSTEMS:   Review of Systems  Constitutional: Negative for chills, fever, malaise/fatigue and weight loss.  HENT: Negative for ear discharge, ear pain, hearing loss, nosebleeds and  tinnitus.   Eyes: Negative for blurred vision, double vision and photophobia.  Respiratory: Negative for cough, hemoptysis, shortness of breath and wheezing.   Cardiovascular: Negative for chest pain, palpitations, orthopnea and leg swelling.  Gastrointestinal: Positive for abdominal pain, nausea and vomiting. Negative for constipation, diarrhea, heartburn and melena.  Genitourinary: Negative for dysuria, frequency, hematuria and urgency.  Musculoskeletal: Negative for back pain, myalgias and neck pain.  Skin: Negative for rash.  Neurological: Negative for dizziness, tingling, tremors, sensory change, speech change, focal weakness and headaches.  Endo/Heme/Allergies: Does not bruise/bleed easily.  Psychiatric/Behavioral: Negative for depression.    MEDICATIONS AT HOME:   Prior to Admission medications   Medication Sig Start Date End Date Taking? Authorizing Provider  chlorhexidine (HIBICLENS) 4 % external liquid Apply topically daily as needed. Patient not taking: Reported on 05/13/2017 05/27/16   Michiel Cowboy A, PA-C  omeprazole (PRILOSEC) 20 MG capsule Take 1 capsule (20 mg total) by mouth daily. Patient not taking: Reported on 05/13/2017 05/24/16   Hower, Cletis [place], MD      VITAL SIGNS:  Blood pressure 119/68, pulse (!) 50, temperature 98.2 F (36.8 C), temperature source Oral, resp. rate 18, weight 76.2 kg (168 lb), SpO2 99 %.  PHYSICAL EXAMINATION:   Physical Exam  GENERAL:  21 y.o.-year-old patient lying in the bed with no acute distress.  EYES: Pupils equal, round, reactive to light and accommodation. No scleral icterus. Extraocular muscles intact.  HEENT: Head atraumatic, normocephalic. Oropharynx and  nasopharynx clear.  NECK:  Supple, no jugular venous distention. No thyroid enlargement, no tenderness.  LUNGS: Normal breath sounds bilaterally, no wheezing, rales,rhonchi or crepitation. No use of accessory muscles of respiration.  CARDIOVASCULAR: S1, S2 normal. No  murmurs, rubs, or gallops.  ABDOMEN: Soft, non tender- the discomfort on palpation. No guarding or rigidity., nondistended. Bowel sounds present. No organomegaly or mass.  EXTREMITIES: No pedal edema, cyanosis, or clubbing.  NEUROLOGIC: Cranial nerves II through XII are intact. Muscle strength 5/5 in all extremities. Sensation intact. Gait not checked.  PSYCHIATRIC: The patient is alert and oriented x 3.  SKIN: No obvious rash, lesion, or ulcer.   LABORATORY PANEL:   CBC  Recent Labs Lab 05/13/17 1114  WBC 6.4  HGB 16.1  HCT 47.3  PLT 308   ------------------------------------------------------------------------------------------------------------------  Chemistries   Recent Labs Lab 05/13/17 1114  NA 138  K 2.8*  CL 98*  CO2 27  GLUCOSE 112*  BUN 10  CREATININE 1.07  CALCIUM 8.9  AST 45*  ALT 36  ALKPHOS 84  BILITOT 0.8   ------------------------------------------------------------------------------------------------------------------  Cardiac Enzymes  Recent Labs Lab 05/13/17 1114  TROPONINI <0.03   ------------------------------------------------------------------------------------------------------------------  RADIOLOGY:  No results found.  EKG:  No orders found for this or any previous visit.  IMPRESSION AND PLAN:   Jose Mays  is a 21 y.o. male with a known history of polysubstance abuse presents to hospital secondary to intractable nausea, vomiting and abdominal pain that started this morning.  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 -No recent travel, no eating outside. -Supportive treatment with IV fluids, nausea medications. -Started on clear liquids. Advance as tolerated. -Abdomen is very soft on exam, no indication for imaging at this time  #2 polysubstance abuse-alcohol and also marijuana  -no signs of withdrawal noted. counseled  #3 Hypokalemia- secondary to GI losses. Being replaced  #4 DVT  Prophylaxis- lovenox    All the records are reviewed and case discussed with ED provider. Management plans discussed with the patient, family and they are in agreement.  CODE STATUS: Full Code  TOTAL TIME TAKING CARE OF THIS PATIENT: 50 minutes.    Jaanvi Fizer M.D on 05/13/2017 at 4:53 PM  Between 7am to 6pm - Pager - [phone]  After 6pm go to [email or web address] - password [employer]  [facility]  Office  [phone]  CC: Primary care physician; Patient, No Pcp Per

## 2017-05-13 NOTE — ED Triage Notes (Signed)
Pt c/o n/v/d and abd pain that started this am.

## 2017-05-13 NOTE — ED Provider Notes (Signed)
[facility] Emergency Department Provider Note ____________________________________________   I have reviewed the triage vital signs and the triage nursing note.  HISTORY  Chief Complaint Nausea; Emesis; and Diarrhea   Historian Patient  HPI Jose Mays is a 21 y.o. male With no significant past medical history, presents with 1-1/2 hours of nausea and nonbloody nonbilious emesis along with some mild diarrhea, not black or bloody. Mild cramping upper abdominal pain. He ate tater tots today. He went to interview today. He states that he did have a friend with a GI illness. Denies fever. He has been sweating.  Symptoms are severe.  Patient denies drug use to me.  Review of prior chart indicates a year ago drug screen was positive for marijuana and opiates.    Past Medical History:  Diagnosis Date  . Abscess     Patient Active Problem List   Diagnosis Date Noted  . Hypokalemia 05/23/2016  . Nausea & vomiting 05/23/2016    Past Surgical History:  Procedure Laterality Date  . TONSILLECTOMY      Prior to Admission medications   Medication Sig Start Date End Date Taking? Authorizing Provider  chlorhexidine (HIBICLENS) 4 % external liquid Apply topically daily as needed. Patient not taking: Reported on 05/13/2017 05/27/16   Michiel Cowboy A, PA-C  omeprazole (PRILOSEC) 20 MG capsule Take 1 capsule (20 mg total) by mouth daily. Patient not taking: Reported on 05/13/2017 05/24/16   Hower, Cletis [place], MD    No Known Allergies  Family History  Problem Relation Age of Onset  . Prostate cancer Paternal Uncle   . Rheum arthritis Paternal Grandmother   . Bladder Cancer Neg Hx     Social History Social History  Substance Use Topics  . Smoking status: Current Every Day Smoker  . Smokeless tobacco: Never Used  . Alcohol use No    Review of Systems  Constitutional: Negative for fever. Eyes: Negative for visual changes. ENT: Negative for sore  throat. Cardiovascular: Negative for chest pain. Respiratory: Negative for shortness of breath. Gastrointestinal: severe nausea and vomiting with also diarrhea as per history of present illness Genitourinary: Negative for dysuria. Musculoskeletal: Negative for back pain. Skin: Negative for rash. Neurological: Negative for headache.  ____________________________________________   PHYSICAL EXAM:  VITAL SIGNS: ED Triage Vitals  Enc Vitals Group     BP 05/13/17 1111 (!) 134/91     Pulse Rate 05/13/17 1111 60     Resp 05/13/17 1111 18     Temp 05/13/17 1111 98.2 F (36.8 C)     Temp Source 05/13/17 1111 Oral     SpO2 05/13/17 1111 100 %     Weight 05/13/17 1112 168 lb (76.2 kg)     Height --      Head Circumference --      Peak Flow --      Pain Score 05/13/17 1110 10     Pain Loc --      Pain Edu? --      Excl. in GC? --      Constitutional: Alert and oriented. ongoing active emesis, patient's in moderate distress from diaphoresis and vomiting. HEENT   Head: Normocephalic and atraumatic.      Eyes: Conjunctivae are normal. Pupils equal and round.       Ears:         Nose: No congestion/rhinnorhea.   Mouth/Throat: Mucous membranes are moist.   Neck: No stridor. Cardiovascular/Chest: Normal rate, regular rhythm.  No murmurs, rubs, or  gallops. Respiratory: Normal respiratory effort without tachypnea nor retractions. Breath sounds are clear and equal bilaterally. No wheezes/rales/rhonchi. Gastrointestinal: Soft. No distention, no guarding, no rebound. mild upper abdominal discomfort.  Genitourinary/rectal:Deferred Musculoskeletal: Nontender with normal range of motion in all extremities. No joint effusions.  No lower extremity tenderness.  No edema. Neurologic:  Normal speech and language. No gross or focal neurologic deficits are appreciated. Skin:  Diaphoretic. No rash noted. Psychiatric: Mood and affect are normal. Speech and behavior are normal. Patient exhibits  appropriate insight and judgment.   ____________________________________________  LABS (pertinent positives/negatives) I, Governor Rooks, MD the attending physician have reviewed the labs noted below.  Labs Reviewed  COMPREHENSIVE METABOLIC PANEL - Abnormal; Notable for the following:       Result Value   Potassium 2.8 (*)    Chloride 98 (*)    Glucose, Bld 112 (*)    AST 45 (*)    All other components within normal limits  LIPASE, BLOOD  CBC  TROPONIN I  URINALYSIS, COMPLETE (UACMP) WITH MICROSCOPIC  URINE DRUG SCREEN, QUALITATIVE ([facility] ONLY)    ____________________________________________    EKG I, Governor Rooks, MD, the attending physician have personally viewed and interpreted all ECGs.  57 bpm normal sinus rhythm. Narrow QRS. Normal axis. Nonspecific ST and T-wave ____________________________________________  RADIOLOGY All Xrays were viewed by me.  Imaging interpreted by Radiologist, and I, Governor Rooks, MD the attending physician have reviewed the radiologist interpretation noted below.  none __________________________________________  PROCEDURES  Procedure(s) performed: None  Critical Care performed: None  ____________________________________________  No current facility-administered medications on file prior to encounter.    Current Outpatient Prescriptions on File Prior to Encounter  Medication Sig Dispense Refill  . chlorhexidine (HIBICLENS) 4 % external liquid Apply topically daily as needed. (Patient not taking: Reported on 05/13/2017) 120 mL 3  . omeprazole (PRILOSEC) 20 MG capsule Take 1 capsule (20 mg total) by mouth daily. (Patient not taking: Reported on 05/13/2017) 30 capsule 0    ____________________________________________  ED COURSE / ASSESSMENT AND PLAN  Pertinent labs & imaging results that were available during my care of the patient were reviewed by me and considered in my medical decision making (see chart for details).  Patient  looks miserable with active emesis although dry heaving at this point. Patient was started on a milligrams of Zofran and IV fluid bolus.  Abdomen soft, I have low suspician for obstruction or biliary source of nausea/vomiting.  Next patient was given IV dose of haldol out of consideration of possible cannibis hyperemesis as the underlying cause.  Patient remains nauseated with dry heaving.  He is still diaphoretic.  I am going to initiate another IV fluid bolus and try reglan for nausea and plan for admission to manage his intractable nausea and vomiting.  Ua, uds ordered, patient has not made urine yet.   DIFFERENTIAL DIAGNOSIS: Differential diagnosis includes, but is not limited to, biliary disease (biliary colic, acute cholecystitis, cholangitis, choledocholithiasis, etc), intrathoracic causes for epigastric abdominal pain including ACS, gastritis, duodenitis, pancreatitis, small bowel or large bowel obstruction, abdominal aortic aneurysm, hernia, and gastritis.  CONSULTATIONS:   Hospitalist for admission.   Patient / Family / Caregiver informed of clinical course, medical decision-making process, and agree with plan.  ___________________________________________   FINAL CLINICAL IMPRESSION(S) / ED DIAGNOSES   Final diagnoses:  Hypokalemia  Nausea vomiting and diarrhea  Intractable vomiting with nausea, unspecified vomiting type  Note: This dictation was prepared with Dragon dictation. Any transcriptional errors that result from this process are unintentional    Governor Rooks, MD 05/13/17 1559

## 2017-05-14 LAB — BASIC METABOLIC PANEL
ANION GAP: 9 (ref 5–15)
BUN: 10 mg/dL (ref 6–20)
CALCIUM: 8.6 mg/dL — AB (ref 8.9–10.3)
CO2: 26 mmol/L (ref 22–32)
Chloride: 104 mmol/L (ref 101–111)
Creatinine, Ser: 0.89 mg/dL (ref 0.61–1.24)
GFR calc non Af Amer: >60 mL/min (ref >60)
Glucose, Bld: 101 mg/dL — ABNORMAL HIGH (ref 65–99)
Potassium: 3.7 mmol/L (ref 3.5–5.1)
SODIUM: 139 mmol/L (ref 135–145)

## 2017-05-14 LAB — CBC
HCT: 43.6 % (ref 40.0–52.0)
HEMOGLOBIN: 15 g/dL (ref 13.0–18.0)
MCH: 32.4 pg (ref 26.0–34.0)
MCHC: 34.4 g/dL (ref 32.0–36.0)
MCV: 94.2 fL (ref 80.0–100.0)
PLATELETS: 289 10*3/uL (ref 150–440)
RBC: 4.63 MIL/uL (ref 4.40–5.90)
RDW: 12.6 % (ref 11.5–14.5)
WBC: 7.3 10*3/uL (ref 3.8–10.6)

## 2017-05-14 MED ORDER — ONDANSETRON 4 MG PO TBDP: 4.0000 mg | ORAL_TABLET | Freq: Three times a day (TID) | ORAL | 0 refills | Status: DC | PRN

## 2017-05-14 NOTE — Progress Notes (Signed)
[facility] - [place] at [facility]   PATIENT NAME: Jose Mays    MR#:  [id number]  DATE OF BIRTH:  11-09-1995  SUBJECTIVE: Admitted for intractable nausea, vomiting, abdominal pain. The patient has no further vomiting since 4:00 this morning. Her abdominal pain. Advance the diet to soft diet. Patient refuses regular food. Try soft diet for lunch, regular diet for supper and if he tolerates regular diet likely discharge later today, patient is agreeable for that.   CHIEF COMPLAINT:   Chief Complaint  Patient presents with  . Nausea  . Emesis  . Diarrhea    REVIEW OF SYSTEMS:   ROS CONSTITUTIONAL: No fever, fatigue or weakness.  EYES: No blurred or double vision.  EARS, NOSE, AND THROAT: No tinnitus or ear pain.  RESPIRATORY: No cough, shortness of breath, wheezing or hemoptysis.  CARDIOVASCULAR: No chest pain, orthopnea, edema.  GASTROINTESTINAL: No nausea, vomiting, diarrhea or abdominal pain.  GENITOURINARY: No dysuria, hematuria.  ENDOCRINE: No polyuria, nocturia,  HEMATOLOGY: No anemia, easy bruising or bleeding SKIN: No rash or lesion. MUSCULOSKELETAL: No joint pain or arthritis.   NEUROLOGIC: No tingling, numbness, weakness.  PSYCHIATRY: No anxiety or depression.   DRUG ALLERGIES:  No Known Allergies  VITALS:  Blood pressure 127/65, pulse (!) 51, temperature 98 F (36.7 C), temperature source Oral, resp. rate 18, height 5\' 5"  (1.651 m), weight 70 kg (154 lb 4.8 oz), SpO2 99 %.  PHYSICAL EXAMINATION:  GENERAL:  21 y.o.-year-old patient lying in the bed with no acute distress.  EYES: Pupils equal, round, reactive to light and accommodation. No scleral icterus. Extraocular muscles intact.  HEENT: Head atraumatic, normocephalic. Oropharynx and nasopharynx clear.  NECK:  Supple, no jugular venous distention. No thyroid enlargement, no tenderness.  LUNGS: Normal breath sounds bilaterally, no wheezing, rales,rhonchi or crepitation. No use of  accessory muscles of respiration.  CARDIOVASCULAR: S1, S2 normal. No murmurs, rubs, or gallops.  ABDOMEN: Soft, nontender, nondistended. Bowel sounds present. No organomegaly or mass.  EXTREMITIES: No pedal edema, cyanosis, or clubbing.  NEUROLOGIC: Cranial nerves II through XII are intact. Muscle strength 5/5 in all extremities. Sensation intact. Gait not checked.  PSYCHIATRIC: The patient is alert and oriented x 3.  SKIN: No obvious rash, lesion, or ulcer.    LABORATORY PANEL:   CBC  Recent Labs Lab 05/14/17 0259  WBC 7.3  HGB 15.0  HCT 43.6  PLT 289   ------------------------------------------------------------------------------------------------------------------  Chemistries   Recent Labs Lab 05/13/17 1114 05/14/17 0259  NA 138 139  K 2.8* 3.7  CL 98* 104  CO2 27 26  GLUCOSE 112* 101*  BUN 10 10  CREATININE 1.07 0.89  CALCIUM 8.9 8.6*  AST 45*  --   ALT 36  --   ALKPHOS 84  --   BILITOT 0.8  --    ------------------------------------------------------------------------------------------------------------------  Cardiac Enzymes  Recent Labs Lab 05/13/17 1114  TROPONINI <0.03   ------------------------------------------------------------------------------------------------------------------  RADIOLOGY:  No results found.  EKG:  No orders found for this or any previous visit.  ASSESSMENT AND PLAN:   InTractable nausea, vomiting likely secondary to acute gastritis: Symptoms improved with IV nausea medicines, IV fluids, IV PPIs. Patient nausea, vomiting improved today, try soft diet, advance to regular diet as tolerated and likely discharge later today. #2 hypokalemia due to GI losses. Patient's potassium improved from. 2.8-3.7. #3 polysubstance abuse: Counselled.    All the records are reviewed and case discussed with Care Management/Social Workerr. Management plans discussed with the patient,  family and they are in agreement.  CODE STATUS:  full  TOTAL TIME TAKING CARE OF THIS PATIENT: 35 minutes.   POSSIBLE D/C IN 1-2DAYS, DEPENDING ON CLINICAL CONDITION.   Katha HammingKONIDENA,[name] M.D on 05/14/2017 at 11:33 AM  Between 7am to 6pm - Pager - [phone]  After 6pm go to [email or web address] - password [email or web address]  Fabio Neighborsagle [place] [facility]  Office  [phone]  CC: Primary care physician; Patient, No Pcp Per   Note: This dictation was prepared with Dragon dictation along with smaller phrase technology. Any transcriptional errors that result from this process are unintentional.

## 2017-05-15 LAB — HIV ANTIBODY (ROUTINE TESTING W REFLEX): HIV Screen 4th Generation wRfx: NONREACTIVE

## 2017-05-15 NOTE — Progress Notes (Signed)
Pt was counseled on the importance of [facility] since he has no insurance. He was given a coupon to use for his prescription and he stated he would be able to afford it and will be picking it up. Discharge instructions were given and he expressed he understood and he walked out with his girlfriend.

## 2017-05-15 NOTE — Discharge Summary (Signed)
[facility] [place] at [facility]   PATIENT NAME: Jose Mays    MR#:  [id number]  DATE OF BIRTH:  03/08/1996  DATE OF ADMISSION:  05/13/2017   ADMITTING PHYSICIAN: Enid Baasadhika Anvith Mauriello, MD  DATE OF DISCHARGE: 05/15/2017 12:24 PM  PRIMARY CARE PHYSICIAN: Patient, No Pcp Per   ADMISSION DIAGNOSIS:   Hypokalemia [E87.6] Nausea vomiting and diarrhea [R11.2, R19.7] Intractable vomiting with nausea, unspecified vomiting type [R11.2] Acute gastroenteritis [K52.9]  DISCHARGE DIAGNOSIS:   Active Problems:   Acute gastroenteritis   SECONDARY DIAGNOSIS:   Past Medical History:  Diagnosis Date  . Abscess     HOSPITAL COURSE:   Jose Mays  is a 21 y.o. male with a known history of polysubstance abuse presents to hospital secondary to intractable nausea, vomiting and abdominal pain that started this morning.  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 -No recent travel, no eating outside. -Supportive treatment given with IV fluids, nausea medications. -Initially started on clear liquid diet and has been advanced as tolerated. Patient is able to tolerate regular diet at discharge. -Abdomen is very soft on exam, no indication for imaging at this time  #2 polysubstance abuse-alcohol and also marijuana  -no signs of withdrawal noted. counseled  #3 Hypokalemia- secondary to GI losses. Replaced while in the hospital  DISCHARGE CONDITIONS:   Stable  CONSULTS OBTAINED:   None  DRUG ALLERGIES:   No Known Allergies DISCHARGE MEDICATIONS:   Allergies as of 05/15/2017   No Known Allergies     Medication List    STOP taking these medications   chlorhexidine 4 % external liquid Commonly known as:  HIBICLENS     TAKE these medications   omeprazole 20 MG capsule Commonly known as:  PRILOSEC Take 1 capsule (20 mg total) by mouth daily.   ondansetron 4 MG disintegrating tablet Commonly known as:  ZOFRAN  ODT Take 1 tablet (4 mg total) by mouth every 8 (eight) hours as needed for nausea or vomiting.        DISCHARGE INSTRUCTIONS:   1. PCP follow-up in 1-2 weeks  DIET:   Regular diet  ACTIVITY:   Activity as tolerated  OXYGEN:   Home Oxygen: No.  Oxygen Delivery: room air  DISCHARGE LOCATION:   home   If you experience worsening of your admission symptoms, develop shortness of breath, life threatening emergency, suicidal or homicidal thoughts you must seek medical attention immediately by calling 911 or calling your MD immediately  if symptoms less severe.  You Must read complete instructions/literature along with all the possible adverse reactions/side effects for all the Medicines you take and that have been prescribed to you. Take any new Medicines after you have completely understood and accpet all the possible adverse reactions/side effects.   Please note  You were cared for by a hospitalist during your hospital stay. If you have any questions about your discharge medications or the care you received while you were in the hospital after you are discharged, you can call the unit and asked to speak with the hospitalist on call if the hospitalist that took care of you is not available. Once you are discharged, your primary care physician will handle any further medical issues. Please note that NO REFILLS for any discharge medications will be authorized once you are discharged, as it is imperative that you return to your primary care physician (or establish a relationship with a primary care physician if you do not have one) for your aftercare  needs so that they can reassess your need for medications and monitor your lab values.    On the day of Discharge:  VITAL SIGNS:   Blood pressure 126/61, pulse 62, temperature 97.6 F (36.4 C), temperature source Oral, resp. rate 18, height 5\' 5"  (1.651 m), weight 70 kg (154 lb 4.8 oz), SpO2 100 %.  PHYSICAL EXAMINATION:     GENERAL:  21 y.o.-year-old patient lying in the bed with no acute distress.  EYES: Pupils equal, round, reactive to light and accommodation. No scleral icterus. Extraocular muscles intact.  HEENT: Head atraumatic, normocephalic. Oropharynx and nasopharynx clear.  NECK:  Supple, no jugular venous distention. No thyroid enlargement, no tenderness.  LUNGS: Normal breath sounds bilaterally, no wheezing, rales,rhonchi or crepitation. No use of accessory muscles of respiration.  CARDIOVASCULAR: S1, S2 normal. No murmurs, rubs, or gallops.  ABDOMEN: Soft, non-tender, non-distended. Bowel sounds present. No organomegaly or mass.  EXTREMITIES: No pedal edema, cyanosis, or clubbing.  NEUROLOGIC: Cranial nerves II through XII are intact. Muscle strength 5/5 in all extremities. Sensation intact. Gait not checked.  PSYCHIATRIC: The patient is alert and oriented x 3.  SKIN: No obvious rash, lesion, or ulcer.   DATA REVIEW:   CBC  Recent Labs Lab 05/14/17 0259  WBC 7.3  HGB 15.0  HCT 43.6  PLT 289    Chemistries   Recent Labs Lab 05/13/17 1114 05/14/17 0259  NA 138 139  K 2.8* 3.7  CL 98* 104  CO2 27 26  GLUCOSE 112* 101*  BUN 10 10  CREATININE 1.07 0.89  CALCIUM 8.9 8.6*  AST 45*  --   ALT 36  --   ALKPHOS 84  --   BILITOT 0.8  --      Microbiology Results  Results for orders placed or performed during the hospital encounter of 07/23/13  Culture, routine-abscess     Status: None   Collection Time: 07/23/13  4:55 PM  Result Value Ref Range Status   Specimen Description ABSCESS SCROTUM RIGHT  Final   Special Requests NONE  Final   Gram Stain   Final    MODERATE WBC PRESENT,BOTH PMN AND MONONUCLEAR NO SQUAMOUS EPITHELIAL CELLS SEEN MODERATE GRAM POSITIVE COCCI IN PAIRS MODERATE GRAM NEGATIVE RODS Performed at [facility]   Culture   Final    RARE STREPTOCOCCUS GROUP C Performed at [facility]   Report Status 07/26/2013 FINAL  Final    RADIOLOGY:   No results found.   Management plans discussed with the patient, family and they are in agreement.  CODE STATUS:     Code Status Orders        Start     Ordered   05/13/17 2001  Full code  Continuous     05/13/17 2000    Code Status History    Date Active Date Inactive Code Status Order ID Comments User Context   05/23/2016  6:31 AM 05/24/2016  2:58 PM Full Code [id number]  Ihor Austin, MD Inpatient      TOTAL TIME TAKING CARE OF THIS PATIENT: 37 minutes.    Enid Baas M.D on 05/15/2017 at 3:17 PM  Between 7am to 6pm - Pager - [phone]  After 6pm go to [email or web address] - Social research officer, government  [facility] [place] [facility]  Office  [phone]  CC: Primary care physician; Patient, No Pcp Per   Note: This dictation was prepared with Dragon dictation along with smaller phrase technology. Any transcriptional errors that result from this process  are unintentional.

## 2017-07-30 ENCOUNTER — Encounter: Payer: Self-pay | Admitting: Emergency Medicine

## 2017-07-30 ENCOUNTER — Emergency Department
Admission: EM | Admit: 2017-07-30 | Discharge: 2017-07-30 | Disposition: A | Discharge status: Home / Self Care | Payer: Medicaid Other | Attending: Emergency Medicine | Admitting: Emergency Medicine

## 2017-07-30 DIAGNOSIS — L02211 Cutaneous abscess of abdominal wall: Secondary | ICD-10-CM | POA: Insufficient documentation

## 2017-07-30 DIAGNOSIS — Z202 Contact with and (suspected) exposure to infections with a predominantly sexual mode of transmission: Secondary | ICD-10-CM

## 2017-07-30 DIAGNOSIS — F17210 Nicotine dependence, cigarettes, uncomplicated: Secondary | ICD-10-CM | POA: Insufficient documentation

## 2017-07-30 DIAGNOSIS — L02214 Cutaneous abscess of groin: Secondary | ICD-10-CM | POA: Insufficient documentation

## 2017-07-30 DIAGNOSIS — L0291 Cutaneous abscess, unspecified: Secondary | ICD-10-CM

## 2017-07-30 MED ORDER — CLINDAMYCIN HCL 300 MG PO CAPS: 300.0000 mg | ORAL_CAPSULE | Freq: Four times a day (QID) | ORAL | 0 refills | Status: AC

## 2017-07-30 MED ORDER — CLINDAMYCIN PHOSPHATE 300 MG/2ML IJ SOLN
INTRAMUSCULAR | Status: AC
  Filled 2017-07-30: qty 2

## 2017-07-30 MED ORDER — METRONIDAZOLE 500 MG PO TABS
2000.0000 mg | ORAL_TABLET | Freq: Once | ORAL | Status: AC
  Administered 2017-07-30: 2000 mg via ORAL
  Filled 2017-07-30: qty 4

## 2017-07-30 MED ORDER — CLINDAMYCIN PHOSPHATE 600 MG/4ML IJ SOLN
600.0000 mg | Freq: Once | INTRAMUSCULAR | Status: AC
  Administered 2017-07-30: 600 mg via INTRAMUSCULAR
  Filled 2017-07-30: qty 4

## 2017-07-30 MED ORDER — LIDOCAINE HCL (PF) 1 % IJ SOLN
5.0000 mL | Freq: Once | INTRAMUSCULAR | Status: AC
  Administered 2017-07-30: 5 mL via INTRADERMAL
  Filled 2017-07-30: qty 5

## 2017-07-30 NOTE — ED Triage Notes (Signed)
Pt comes into the ED via POV c/o abscess above the pubic bone.  Patient state he feels as though he's been running a fever at home but denies checking it.  Patient denies any drainage from the abscess.  States he has a h/o abscesses in this area.

## 2017-07-30 NOTE — ED Provider Notes (Signed)
[facility] Emergency Department Provider Note  ____________________________________________  Time seen: Approximately 6:59 PM  I have reviewed the triage vital signs and the nursing notes.   HISTORY  Chief Complaint Abscess    HPI Jose Mays is a 22 y.o. male that presents to the emergency department for evaluation of abscess to lower abdomen for 4 days.  Patient states that he frequently gets abscesses around this area and eventually they pop and drain on their own.  This one has been getting bigger but has not drained yet.  He has felt warm but has not checked his temperature.  He reports that he also got a phone call from "a girl I am fooling around with" today that she has trichomonas.  He denies nausea, vomiting, abdominal pain, dysuria, urgency, frequency.  Past Medical History:  Diagnosis Date  . Abscess     Patient Active Problem List   Diagnosis Date Noted  . Acute gastroenteritis 05/13/2017  . Hypokalemia 05/23/2016  . Nausea & vomiting 05/23/2016    Past Surgical History:  Procedure Laterality Date  . TONSILLECTOMY      Prior to Admission medications   Medication Sig Start Date End Date Taking? Authorizing Provider  clindamycin (CLEOCIN) 300 MG capsule Take 1 capsule (300 mg total) by mouth 4 (four) times daily for 10 days. 07/30/17 08/09/17  Enid Derry, PA-C  omeprazole (PRILOSEC) 20 MG capsule Take 1 capsule (20 mg total) by mouth daily. Patient not taking: Reported on 05/13/2017 05/24/16   Hower, Cletis Athens, MD  ondansetron (ZOFRAN ODT) 4 MG disintegrating tablet Take 1 tablet (4 mg total) by mouth every 8 (eight) hours as needed for nausea or vomiting. 05/14/17   Katha Hamming, MD    Allergies Patient has no known allergies.  Family History  Problem Relation Age of Onset  . Prostate cancer Paternal Uncle   . Rheum arthritis Paternal Grandmother   . Bladder Cancer Neg Hx     Social History Social History   Tobacco  Use  . Smoking status: Current Every Day Smoker  . Smokeless tobacco: Never Used  Substance Use Topics  . Alcohol use: No  . Drug use: No     Review of Systems  Constitutional: No fever/chills Cardiovascular: No chest pain. Respiratory: No SOB. Gastrointestinal: No abdominal pain.  No nausea, no vomiting.  Genitourinary: Negative for dysuria. Musculoskeletal: Negative for musculoskeletal pain. Skin: Negative for abrasions, lacerations, ecchymosis.   ____________________________________________   PHYSICAL EXAM:  VITAL SIGNS: ED Triage Vitals  Enc Vitals Group     BP 07/30/17 1812 113/64     Pulse Rate 07/30/17 1812 69     Resp 07/30/17 1812 16     Temp 07/30/17 1812 98.3 F (36.8 C)     Temp Source 07/30/17 1812 Oral     SpO2 07/30/17 1812 98 %     Weight 07/30/17 1813 150 lb (68 kg)     Height 07/30/17 1813 5\' 5"  (1.651 m)     Head Circumference --      Peak Flow --      Pain Score 07/30/17 1812 8     Pain Loc --      Pain Edu? --      Excl. in GC? --      Constitutional: Alert and oriented. Well appearing and in no acute distress. Eyes: Conjunctivae are normal. PERRL. EOMI. Head: Atraumatic. ENT:      Ears:      Nose: No congestion/rhinnorhea.  Mouth/Throat: Mucous membranes are moist.  Neck: No stridor.   Cardiovascular: Normal rate, regular rhythm.  Good peripheral circulation. Respiratory: Normal respiratory effort without tachypnea or retractions. Lungs CTAB. Good air entry to the bases with no decreased or absent breath sounds. Musculoskeletal: Full range of motion to all extremities. No gross deformities appreciated. Neurologic:  Normal speech and language. No gross focal neurologic deficits are appreciated.  Skin:  Skin is warm, dry and intact.  1 cm x 1 cm area of swelling and fluctuance to suprapubic area.  1 4 cm by 1 4 cm area of swelling and fluctuance to left groin.  No surrounding  erythema.    ____________________________________________   LABS (all labs ordered are listed, but only abnormal results are displayed)  Labs Reviewed - No data to display ____________________________________________  EKG   ____________________________________________  RADIOLOGY   No results found.  ____________________________________________    PROCEDURES  Procedure(s) performed:    Procedures  INCISION AND DRAINAGE Performed by: Enid DerryAshley Shylo Dillenbeck Consent: Verbal consent obtained. Risks and benefits: risks, benefits and alternatives were discussed Type: abscess  Body area: suprapubic   Anesthesia: local infiltration  Incision was made with a scalpel.  Local anesthetic: lidocaine 1% without epinephrine  Anesthetic total: 3 ml  Complexity: complex Blunt dissection to break up loculations  Drainage: purulent  Drainage amount: 4ccs  Packing material: 1/4 in iodoform gauze  Patient tolerance: Patient tolerated the procedure well with no immediate complications.  INCISION AND DRAINAGE Performed by: Enid DerryAshley Dacota Devall Consent: Verbal consent obtained. Risks and benefits: risks, benefits and alternatives were discussed Type: abscess  Body area: left groin  Anesthesia: local infiltration  Incision was made with a scalpel.  Local anesthetic: lidocaine 1% without epinephrine  Anesthetic total: 1 ml  Complexity: complex Blunt dissection to break up loculations  Drainage: purulent  Drainage amount: 1 cc  Packing material: 1/4 in iodoform gauze  Patient tolerance: Patient tolerated the procedure well with no immediate complications.    Medications  lidocaine (PF) (XYLOCAINE) 1 % injection 5 mL (5 mLs Intradermal Given 07/30/17 1942)  metroNIDAZOLE (FLAGYL) tablet 2,000 mg (2,000 mg Oral Given 07/30/17 1942)  clindamycin (CLEOCIN) injection 600 mg (600 mg Intramuscular Given 07/30/17 1942)      ____________________________________________   INITIAL IMPRESSION / ASSESSMENT AND PLAN / ED COURSE  Pertinent labs & imaging results that were available during my care of the patient were reviewed by me and considered in my medical decision making (see chart for details).  Review of the [place] CSRS was performed in accordance of the [facility] prior to dispensing any controlled drugs.     Patient's diagnosis is consistent with skin abscesses and exposure to STD.  Vital signs and exam are reassuring.  Abscesses were drained and packed in ED.  Patient was given IM clindamycin.  Patient will also be treated for exposure to trichomonas with Flagyl.  He was told not to drink for 48 hours due to reaction with Flagyl.  Patient will be discharged home with prescriptions for clindamycin. Patient is to follow up with PCP as directed. Patient is given ED precautions to return to the ED for any worsening or new symptoms.     ____________________________________________  FINAL CLINICAL IMPRESSION(S) / ED DIAGNOSES  Final diagnoses:  Abscess  Exposure to STD      NEW MEDICATIONS STARTED DURING THIS VISIT:  ED Discharge Orders        Ordered    clindamycin (CLEOCIN) 300 MG capsule  4 times daily  07/30/17 2011          This chart was dictated using voice recognition software/Dragon. Despite best efforts to proofread, errors can occur which can change the meaning. Any change was purely unintentional.    Enid Derry, PA-C 07/30/17 2224    Minna Antis, MD 07/30/17 2317

## 2018-03-05 ENCOUNTER — Other Ambulatory Visit: Payer: Self-pay

## 2018-03-05 ENCOUNTER — Emergency Department
Admission: EM | Admit: 2018-03-05 | Discharge: 2018-03-05 | Disposition: A | Discharge status: Home / Self Care | Payer: Medicaid Other | Attending: Emergency Medicine | Admitting: Emergency Medicine

## 2018-03-05 ENCOUNTER — Encounter: Payer: Self-pay | Admitting: Emergency Medicine

## 2018-03-05 DIAGNOSIS — F17200 Nicotine dependence, unspecified, uncomplicated: Secondary | ICD-10-CM | POA: Insufficient documentation

## 2018-03-05 DIAGNOSIS — A6001 Herpesviral infection of penis: Secondary | ICD-10-CM

## 2018-03-05 DIAGNOSIS — B009 Herpesviral infection, unspecified: Secondary | ICD-10-CM | POA: Insufficient documentation

## 2018-03-05 DIAGNOSIS — Z202 Contact with and (suspected) exposure to infections with a predominantly sexual mode of transmission: Secondary | ICD-10-CM | POA: Insufficient documentation

## 2018-03-05 MED ORDER — METRONIDAZOLE 500 MG PO TABS
2000.0000 mg | ORAL_TABLET | Freq: Once | ORAL | Status: AC
  Administered 2018-03-05: 2000 mg via ORAL
  Filled 2018-03-05: qty 4

## 2018-03-05 MED ORDER — AZITHROMYCIN 500 MG PO TABS
1000.0000 mg | ORAL_TABLET | Freq: Once | ORAL | Status: AC
  Administered 2018-03-05: 1000 mg via ORAL
  Filled 2018-03-05: qty 2

## 2018-03-05 MED ORDER — CEFTRIAXONE SODIUM 250 MG IJ SOLR
250.0000 mg | Freq: Once | INTRAMUSCULAR | Status: AC
  Administered 2018-03-05: 250 mg via INTRAMUSCULAR
  Filled 2018-03-05: qty 250

## 2018-03-05 MED ORDER — VALACYCLOVIR HCL 1 G PO TABS: 1000.0000 mg | ORAL_TABLET | Freq: Two times a day (BID) | ORAL | 0 refills | Status: AC

## 2018-03-05 NOTE — ED Provider Notes (Signed)
[facility] Emergency Department Provider Note  ____________________________________________  Time seen: Approximately 10:42 PM  I have reviewed the triage vital signs and the nursing notes.   HISTORY  Chief Complaint Rash    HPI Jose Mays is a 22 y.o. male presents to the emergency department with a rash along the penis that started with clear white blisters.  Patient reports that he had unprotected sex approximately 1 week ago.  He denies dysuria, hematuria or increased urinary frequency.  No low back pain.  No nausea or vomiting.  Patient is accompanied by his pregnant girlfriend.  No alleviating measures have been attempted.   Past Medical History:  Diagnosis Date  . Abscess     Patient Active Problem List   Diagnosis Date Noted  . Acute gastroenteritis 05/13/2017  . Hypokalemia 05/23/2016  . Nausea & vomiting 05/23/2016    Past Surgical History:  Procedure Laterality Date  . TONSILLECTOMY      Prior to Admission medications   Medication Sig Start Date End Date Taking? Authorizing Provider  omeprazole (PRILOSEC) 20 MG capsule Take 1 capsule (20 mg total) by mouth daily. Patient not taking: Reported on 05/13/2017 05/24/16   Hower, Cletis [place], MD  ondansetron (ZOFRAN ODT) 4 MG disintegrating tablet Take 1 tablet (4 mg total) by mouth every 8 (eight) hours as needed for nausea or vomiting. 05/14/17   Katha Hamming, MD  valACYclovir (VALTREX) 1000 MG tablet Take 1 tablet (1,000 mg total) by mouth 2 (two) times daily for 10 days. 03/05/18 03/15/18  Orvil Feil, PA-C    Allergies Patient has no known allergies.  Family History  Problem Relation Age of Onset  . Prostate cancer Paternal Uncle   . Rheum arthritis Paternal Grandmother   . Bladder Cancer Neg Hx     Social History Social History   Tobacco Use  . Smoking status: Current Every Day Smoker  . Smokeless tobacco: Never Used  Substance Use Topics  . Alcohol use: No  .  Drug use: No     Review of Systems  Constitutional: No fever/chills Eyes: No visual changes. No discharge ENT: No upper respiratory complaints. Cardiovascular: no chest pain. Respiratory: no cough. No SOB. Gastrointestinal: No abdominal pain.  No nausea, no vomiting.  No diarrhea.  No constipation. Musculoskeletal: Negative for musculoskeletal pain. Skin: Patient has rash of penis.  Neurological: Negative for headaches, focal weakness or numbness.   ____________________________________________   PHYSICAL EXAM:  VITAL SIGNS: ED Triage Vitals  Enc Vitals Group     BP 03/05/18 1953 116/62     Pulse Rate 03/05/18 1953 73     Resp 03/05/18 1953 16     Temp 03/05/18 1953 98.3 F (36.8 C)     Temp Source 03/05/18 1953 Oral     SpO2 03/05/18 1953 99 %     Weight 03/05/18 1955 162 lb (73.5 kg)     Height 03/05/18 1955 5\' 5"  (1.651 m)     Head Circumference --      Peak Flow --      Pain Score 03/05/18 1955 0     Pain Loc --      Pain Edu? --      Excl. in GC? --      Constitutional: Alert and oriented. Well appearing and in no acute distress. Eyes: Conjunctivae are normal. PERRL. EOMI. Head: Atraumatic. Cardiovascular: Normal rate, regular rhythm. Normal S1 and S2.  Good peripheral circulation. Respiratory: Normal respiratory effort without tachypnea or  retractions. Lungs CTAB. Good air entry to the bases with no decreased or absent breath sounds. Musculoskeletal: Full range of motion to all extremities. No gross deformities appreciated. Neurologic:  Normal speech and language. No gross focal neurologic deficits are appreciated.  Skin: Patient has a clustered rash with vesicle formation along the underside of the penis. Psychiatric: Mood and affect are normal. Speech and behavior are normal. Patient exhibits appropriate insight and judgement.   ____________________________________________   LABS (all labs ordered are listed, but only abnormal results are  displayed)  Labs Reviewed  CHLAMYDIA/NGC RT PCR ([facility] ONLY)  URINALYSIS, COMPLETE (UACMP) WITH MICROSCOPIC   ____________________________________________  EKG   ____________________________________________  RADIOLOGY   No results found.  ____________________________________________    PROCEDURES  Procedure(s) performed:    Procedures    Medications  cefTRIAXone (ROCEPHIN) injection 250 mg (250 mg Intramuscular Given 03/05/18 2231)  azithromycin (ZITHROMAX) tablet 1,000 mg (1,000 mg Oral Given 03/05/18 2227)  metroNIDAZOLE (FLAGYL) tablet 2,000 mg (2,000 mg Oral Given 03/05/18 2228)     ____________________________________________   INITIAL IMPRESSION / ASSESSMENT AND PLAN / ED COURSE  Pertinent labs & imaging results that were available during my care of the patient were reviewed by me and considered in my medical decision making (see chart for details).  Review of the [place] CSRS was performed in accordance of the [facility] prior to dispensing any controlled drugs.      Assessment and plan STD exposure Patient presents to the emergency department with an erythematous rash with vesicle formation of the penis that started after patient had unprotected sex.  Physical exam findings are consistent with genital herpes.  Patient was treated empirically with Valtrex.  He was also treated with Rocephin, azithromycin and Flagyl for gonorrhea, chlamydia and trichomoniasis.  He was advised to follow-up with local health department for full STD panel.  Vital signs are reassuring prior to discharge.  All patient questions were answered.     ____________________________________________  FINAL CLINICAL IMPRESSION(S) / ED DIAGNOSES  Final diagnoses:  STD exposure  Herpes simplex infection of penis      NEW MEDICATIONS STARTED DURING THIS VISIT:  ED Discharge Orders         Ordered    valACYclovir (VALTREX) 1000 MG tablet  2 times daily     03/05/18 2215               This chart was dictated using voice recognition software/Dragon. Despite best efforts to proofread, errors can occur which can change the meaning. Any change was purely unintentional.    Orvil FeilWoods, Minda Faas M, PA-C 03/05/18 2251    Phineas SemenGoodman, Gray[place], MD 03/05/18 [phone]

## 2018-03-05 NOTE — ED Triage Notes (Signed)
Patient ambulatory to triage with steady gait, without difficulty or distress noted; pt reports "rash" to penis x 3-4 days; denies urinary c/o or discharge

## 2020-08-06 ENCOUNTER — Emergency Department: Payer: Self-pay

## 2020-08-06 ENCOUNTER — Other Ambulatory Visit: Payer: Self-pay

## 2020-08-06 ENCOUNTER — Encounter: Payer: Self-pay | Admitting: Radiology

## 2020-08-06 ENCOUNTER — Emergency Department
Admission: EM | Admit: 2020-08-06 | Discharge: 2020-08-06 | Disposition: A | Discharge status: Home / Self Care | Payer: Self-pay | Attending: Emergency Medicine | Admitting: Emergency Medicine

## 2020-08-06 DIAGNOSIS — Y92410 Unspecified street and highway as the place of occurrence of the external cause: Secondary | ICD-10-CM | POA: Insufficient documentation

## 2020-08-06 DIAGNOSIS — F17200 Nicotine dependence, unspecified, uncomplicated: Secondary | ICD-10-CM | POA: Insufficient documentation

## 2020-08-06 DIAGNOSIS — M542 Cervicalgia: Secondary | ICD-10-CM | POA: Insufficient documentation

## 2020-08-06 MED ORDER — METHOCARBAMOL 500 MG PO TABS: 500.0000 mg | ORAL_TABLET | Freq: Three times a day (TID) | ORAL | 0 refills | Status: AC | PRN

## 2020-08-06 MED ORDER — MELOXICAM 15 MG PO TABS: 15.0000 mg | ORAL_TABLET | Freq: Every day | ORAL | 2 refills | Status: AC
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
## 2020-09-23 ENCOUNTER — Emergency Department
Admission: EM | Admit: 2020-09-23 | Discharge: 2020-09-23 | Disposition: A | Discharge status: Home / Self Care | Payer: Medicaid Other | Attending: Emergency Medicine | Admitting: Emergency Medicine

## 2020-09-23 ENCOUNTER — Other Ambulatory Visit: Payer: Self-pay

## 2020-09-23 DIAGNOSIS — R109 Unspecified abdominal pain: Secondary | ICD-10-CM | POA: Insufficient documentation

## 2020-09-23 DIAGNOSIS — R197 Diarrhea, unspecified: Secondary | ICD-10-CM | POA: Insufficient documentation

## 2020-09-23 DIAGNOSIS — F17200 Nicotine dependence, unspecified, uncomplicated: Secondary | ICD-10-CM | POA: Insufficient documentation

## 2020-09-23 DIAGNOSIS — R112 Nausea with vomiting, unspecified: Secondary | ICD-10-CM | POA: Insufficient documentation

## 2020-09-23 LAB — CBC
HCT: 39.9 % (ref 39.0–52.0)
Hemoglobin: 13.5 g/dL (ref 13.0–17.0)
MCH: 31.2 pg (ref 26.0–34.0)
MCHC: 33.8 g/dL (ref 30.0–36.0)
MCV: 92.1 fL (ref 80.0–100.0)
Platelets: 516 10*3/uL — ABNORMAL HIGH (ref 150–400)
RBC: 4.33 MIL/uL (ref 4.22–5.81)
RDW: 12.1 % (ref 11.5–15.5)
WBC: 14.2 10*3/uL — ABNORMAL HIGH (ref 4.0–10.5)
nRBC: 0 % (ref 0.0–0.2)

## 2020-09-23 LAB — COMPREHENSIVE METABOLIC PANEL
ALT: 14 U/L (ref 0–44)
AST: 29 U/L (ref 15–41)
Albumin: 4 g/dL (ref 3.5–5.0)
Alkaline Phosphatase: 85 U/L (ref 38–126)
Anion gap: 16 — ABNORMAL HIGH (ref 5–15)
BUN: 10 mg/dL (ref 6–20)
CO2: 23 mmol/L (ref 22–32)
Calcium: 9.3 mg/dL (ref 8.9–10.3)
Chloride: 99 mmol/L (ref 98–111)
Creatinine, Ser: 1.03 mg/dL (ref 0.61–1.24)
GFR, Estimated: >60 mL/min (ref >60)
Glucose, Bld: 172 mg/dL — ABNORMAL HIGH (ref 70–99)
Potassium: 3.2 mmol/L — ABNORMAL LOW (ref 3.5–5.1)
Sodium: 138 mmol/L (ref 135–145)
Total Bilirubin: 0.9 mg/dL (ref 0.3–1.2)
Total Protein: 9.1 g/dL — ABNORMAL HIGH (ref 6.5–8.1)

## 2020-09-23 LAB — LIPASE, BLOOD: Lipase: 27 U/L (ref 11–51)

## 2020-09-23 MED ORDER — SODIUM CHLORIDE 0.9 % IV BOLUS
1000.0000 mL | Freq: Once | INTRAVENOUS | Status: AC
  Administered 2020-09-23: 1000 mL via INTRAVENOUS

## 2020-09-23 MED ORDER — ONDANSETRON HCL 4 MG/2ML IJ SOLN
INTRAMUSCULAR | Status: AC
  Administered 2020-09-23: 4 mg via INTRAVENOUS
  Filled 2020-09-23: qty 2

## 2020-09-23 MED ORDER — ONDANSETRON HCL 4 MG PO TABS: 4.0000 mg | ORAL_TABLET | Freq: Three times a day (TID) | ORAL | 0 refills | Status: DC | PRN

## 2020-09-23 MED ORDER — DICYCLOMINE HCL 10 MG PO CAPS: 10.0000 mg | ORAL_CAPSULE | Freq: Three times a day (TID) | ORAL | 0 refills | Status: DC | PRN

## 2020-09-23 MED ORDER — ONDANSETRON HCL 4 MG/2ML IJ SOLN: 4.0000 mg | Freq: Once | INTRAMUSCULAR | Status: AC

## 2020-09-23 MED ORDER — ONDANSETRON 4 MG PO TBDP: 4.0000 mg | ORAL_TABLET | Freq: Once | ORAL | Status: DC | PRN

## 2020-09-23 MED ORDER — HALOPERIDOL LACTATE 5 MG/ML IJ SOLN
5.0000 mg | Freq: Once | INTRAMUSCULAR | Status: AC
  Administered 2020-09-23: 5 mg via INTRAVENOUS
  Filled 2020-09-23: qty 1

## 2020-09-23 NOTE — ED Triage Notes (Signed)
Pt states he ate chinese food around 3pm this afternoon and since then has had emesis and diarrhea 5 times. Pt arrives diaphoretic and states he cant feel his hands.

## 2020-09-23 NOTE — Discharge Instructions (Signed)
Please seek medical attention for any high fevers, chest pain, shortness of breath, change in behavior, persistent vomiting, bloody stool or any other new or concerning symptoms.  

## 2020-09-23 NOTE — ED Provider Notes (Signed)
[facility] Emergency Department Provider Note    ____________________________________________   I have reviewed the triage vital signs and the nursing notes.   HISTORY  Chief Complaint Food poisoning  History limited by: Not Limited   HPI Jose Mays is a 25 y.o. male who presents to the emergency department today because of concerns for food poisoning.  He states that he ate [place] of this afternoon and then had sudden onset of nausea vomiting and diarrhea.  He is unsure how many times he vomited or had diarrhea.  This was accompanied by diffuse abdominal pain.  Patient denies any known sick contacts.  Denies any fevers or shortness of breath.  Denies any history of abdominal pathology.  Records reviewed. Per medical record review patient has a history of hospital admission in 2017 for nausea vomiting and diarrhea.   Past Medical History:  Diagnosis Date  . Abscess     Patient Active Problem List   Diagnosis Date Noted  . Acute gastroenteritis 05/13/2017  . Hypokalemia 05/23/2016  . Nausea & vomiting 05/23/2016    Past Surgical History:  Procedure Laterality Date  . TONSILLECTOMY      Prior to Admission medications   Medication Sig Start Date End Date Taking? Authorizing Provider  meloxicam (MOBIC) 15 MG tablet Take 1 tablet (15 mg total) by mouth daily. 08/06/20 08/06/21  Orvil Feil, PA-C  omeprazole (PRILOSEC) 20 MG capsule Take 1 capsule (20 mg total) by mouth daily. Patient not taking: Reported on 05/13/2017 05/24/16   Hower, Cletis Athens, MD  ondansetron (ZOFRAN ODT) 4 MG disintegrating tablet Take 1 tablet (4 mg total) by mouth every 8 (eight) hours as needed for nausea or vomiting. 05/14/17   Katha Hamming, MD    Allergies Patient has no known allergies.  Family History  Problem Relation Age of Onset  . Prostate cancer Paternal Uncle   . Rheum arthritis Paternal Grandmother   . Bladder Cancer Neg Hx     Social  History Social History   Tobacco Use  . Smoking status: Current Every Day Smoker  . Smokeless tobacco: Never Used  Substance Use Topics  . Alcohol use: No  . Drug use: No    Review of Systems Constitutional: No fever/chills Eyes: No visual changes. ENT: No sore throat. Cardiovascular: Denies chest pain. Respiratory: Denies shortness of breath. Gastrointestinal: Positive for abdominal pain, nausea, vomiting and diarrhea.  Genitourinary: Negative for dysuria. Musculoskeletal: Negative for back pain. Skin: Negative for rash. Neurological: Negative for headaches, focal weakness or numbness.  ____________________________________________   PHYSICAL EXAM:  VITAL SIGNS: ED Triage Vitals  Enc Vitals Group     BP 09/23/20 1909 (!) 144/76     Pulse Rate 09/23/20 1909 77     Resp 09/23/20 1909 20     Temp 09/23/20 1909 97.8 F (36.6 C)     Temp Source 09/23/20 1909 Oral     SpO2 09/23/20 1909 100 %     Weight 09/23/20 1907 260 lb (117.9 kg)     Height 09/23/20 1907 5\' 11"  (1.803 m)     Head Circumference --      Peak Flow --      Pain Score 09/23/20 1906 10   Constitutional: Alert and oriented.  Eyes: Conjunctivae are normal.  ENT      Head: Normocephalic and atraumatic.      Nose: No congestion/rhinnorhea.      Mouth/Throat: Mucous membranes are moist.      Neck: No  stridor. Hematological/Lymphatic/Immunilogical: No cervical lymphadenopathy. Cardiovascular: Normal rate, regular rhythm.  No murmurs, rubs, or gallops.  Respiratory: Normal respiratory effort without tachypnea nor retractions. Breath sounds are clear and equal bilaterally. No wheezes/rales/rhonchi. Gastrointestinal: Soft and non tender. No rebound. No guarding.  Genitourinary: Deferred Musculoskeletal: Normal range of motion in all extremities. No lower extremity edema. Neurologic:  Normal speech and language. No gross focal neurologic deficits are appreciated.  Skin:  Skin is warm, dry and intact. No rash  noted. Psychiatric: Mood and affect are normal. Speech and behavior are normal. Patient exhibits appropriate insight and judgment.  ____________________________________________    LABS (pertinent positives/negatives)  Lipase 27 CBC wbc 14.2, hgb 13.5, plt 516 CMP na 138, k 3.2, glu 172, cr 1.03  ____________________________________________   EKG  None  ____________________________________________    RADIOLOGY  None  ____________________________________________   PROCEDURES  Procedures  ____________________________________________   INITIAL IMPRESSION / ASSESSMENT AND PLAN / ED COURSE  Pertinent labs & imaging results that were available during my care of the patient were reviewed by me and considered in my medical decision making (see chart for details).   Patient presented to the emergency department today because of concerns for nausea vomiting and diarrhea.  This did start shortly after the patient ate [place] food.  Patient did have a slight leukocytosis here in the emergency department.  He did feel better after IV fluids and medication.  On exam patient's abdomen is nontender.  At this time I do think food poisoning is likely the cause of the patient's symptoms.  I think leukocytosis is likely reactive.  Given that patient feels better will plan on discharging home.  Will give patient prescription for Bentyl and Zofran.  ____________________________________________   FINAL CLINICAL IMPRESSION(S) / ED DIAGNOSES  Final diagnoses:  Nausea vomiting and diarrhea     Note: This dictation was prepared with Dragon dictation. Any transcriptional errors that result from this process are unintentional     Phineas Semen, MD 09/23/20 2135

## 2020-09-23 NOTE — ED Notes (Signed)
Patient brought back from triage and assisted into bed by two RNs. Patient screaming out. This RN to bedside to introduce self to patient. Patient diaphoretic and complaining of nausea and abdominal pain. When trying to ask patient questions, patient states "I already told them that can yall communicate better". Reassurance provided. Triage RN assisted in placing IV. Patient provided with emesis bag but spit on floor twice. Patient placed on monitor then removed all equipment. This RN stepped out to grab warm blankets and speak with the provider about nausea medication. When back to room, patient was noted standing attempting to urinate in the trash can. Patient instructed to use urinal at bedside and was compliant.

## 2020-09-23 NOTE — ED Notes (Signed)
ED Provider at bedside. 

## 2020-09-23 NOTE — ED Notes (Signed)
Provider aware patient still vomiting persistently after medication administration. Verbal orders received. See MAR.

## 2020-10-17 ENCOUNTER — Emergency Department
Admission: EM | Admit: 2020-10-17 | Discharge: 2020-10-17 | Disposition: A | Discharge status: Home / Self Care | Payer: Medicaid Other | Attending: Emergency Medicine | Admitting: Emergency Medicine

## 2020-10-17 ENCOUNTER — Other Ambulatory Visit: Payer: Self-pay

## 2020-10-17 DIAGNOSIS — F17200 Nicotine dependence, unspecified, uncomplicated: Secondary | ICD-10-CM | POA: Insufficient documentation

## 2020-10-17 DIAGNOSIS — L0231 Cutaneous abscess of buttock: Secondary | ICD-10-CM | POA: Insufficient documentation

## 2020-10-17 MED ORDER — LIDOCAINE HCL (PF) 1 % IJ SOLN
5.0000 mL | Freq: Once | INTRAMUSCULAR | Status: AC
  Administered 2020-10-17: 5 mL
  Filled 2020-10-17: qty 5

## 2020-10-17 MED ORDER — OXYCODONE-ACETAMINOPHEN 7.5-325 MG PO TABS: 1.0000 | ORAL_TABLET | Freq: Four times a day (QID) | ORAL | 0 refills | Status: DC | PRN

## 2020-10-17 MED ORDER — SULFAMETHOXAZOLE-TRIMETHOPRIM 800-160 MG PO TABS: 1.0000 | ORAL_TABLET | Freq: Two times a day (BID) | ORAL | 0 refills | Status: DC

## 2020-10-17 MED ORDER — NAPROXEN 500 MG PO TABS: 500.0000 mg | ORAL_TABLET | Freq: Two times a day (BID) | ORAL | 0 refills | Status: DC

## 2020-10-17 NOTE — ED Provider Notes (Signed)
[facility] Emergency Department Provider Note   ____________________________________________   Event Date/Time   First MD Initiated Contact with Patient 10/17/20 1356     (approximate)  I have reviewed the triage vital signs and the nursing notes.   HISTORY  Chief Complaint Abscess    HPI Jose Mays is a 25 y.o. male patient presents with pain,  Edema, erythema to the right buttocks for for 5 days.  Patient state he took some leftover penicillin but is not helped.  Patient states lesion is firm but denies drainage.  Rates pain as 8/10.  Described pain as "sore".  No other palliative measure for complaint.         Past Medical History:  Diagnosis Date  . Abscess     Patient Active Problem List   Diagnosis Date Noted  . Acute gastroenteritis 05/13/2017  . Hypokalemia 05/23/2016  . Nausea & vomiting 05/23/2016    Past Surgical History:  Procedure Laterality Date  . TONSILLECTOMY      Prior to Admission medications   Medication Sig Start Date End Date Taking? Authorizing Provider  naproxen (NAPROSYN) 500 MG tablet Take 1 tablet (500 mg total) by mouth 2 (two) times daily with a meal. 10/17/20  Yes Joni Reining, PA-C  oxyCODONE-acetaminophen (PERCOCET) 7.5-325 MG tablet Take 1 tablet by mouth every 6 (six) hours as needed. 10/17/20  Yes Joni Reining, PA-C  sulfamethoxazole-trimethoprim (BACTRIM DS) 800-160 MG tablet Take 1 tablet by mouth 2 (two) times daily. 10/17/20  Yes Joni Reining, PA-C  dicyclomine (BENTYL) 10 MG capsule Take 1 capsule (10 mg total) by mouth 3 (three) times daily as needed for up to 14 days (abd pain). 09/23/20 10/07/20  Phineas Semen, MD  meloxicam (MOBIC) 15 MG tablet Take 1 tablet (15 mg total) by mouth daily. 08/06/20 08/06/21  Orvil Feil, PA-C  omeprazole (PRILOSEC) 20 MG capsule Take 1 capsule (20 mg total) by mouth daily. Patient not taking: Reported on 05/13/2017 05/24/16   Hower, Cletis Athens, MD   ondansetron (ZOFRAN ODT) 4 MG disintegrating tablet Take 1 tablet (4 mg total) by mouth every 8 (eight) hours as needed for nausea or vomiting. 05/14/17   Katha Hamming, MD  ondansetron (ZOFRAN) 4 MG tablet Take 1 tablet (4 mg total) by mouth every 8 (eight) hours as needed. 09/23/20   Phineas Semen, MD    Allergies Patient has no known allergies.  Family History  Problem Relation Age of Onset  . Prostate cancer Paternal Uncle   . Rheum arthritis Paternal Grandmother   . Bladder Cancer Neg Hx     Social History Social History   Tobacco Use  . Smoking status: Current Every Day Smoker  . Smokeless tobacco: Never Used  Substance Use Topics  . Alcohol use: No  . Drug use: No    Review of Systems Constitutional: No fever/chills Eyes: No visual changes. ENT: No sore throat. Cardiovascular: Denies chest pain. Respiratory: Denies shortness of breath. Gastrointestinal: No abdominal pain.  No nausea, no vomiting.  No diarrhea.  No constipation. Genitourinary: Negative for dysuria. Musculoskeletal: Negative for back pain. Skin: Negative for rash.  Nodule lesion right buttocks. Neurological: Negative for headaches, focal weakness or numbness.   ____________________________________________   PHYSICAL EXAM:  VITAL SIGNS: ED Triage Vitals  Enc Vitals Group     BP 10/17/20 1321 133/78     Pulse Rate 10/17/20 1321 95     Resp 10/17/20 1321 17  Temp 10/17/20 1321 98.2 F (36.8 C)     Temp Source 10/17/20 1321 Oral     SpO2 10/17/20 1321 98 %     Weight 10/17/20 1322 200 lb (90.7 kg)     Height 10/17/20 1419 5\' 11"  (1.803 m)     Head Circumference --      Peak Flow --      Pain Score 10/17/20 1324 8     Pain Loc --      Pain Edu? --      Excl. in GC? --     Constitutional: Alert and oriented. Well appearing and in no acute distress. Cardiovascular: Normal rate, regular rhythm. Grossly normal heart sounds.  Good peripheral circulation. Respiratory: Normal  respiratory effort.  No retractions. Lungs CTAB. Skin: Fluctuant nodule lesion right buttocks.  Psychiatric: Mood and affect are normal. Speech and behavior are normal.  ____________________________________________   LABS (all labs ordered are listed, but only abnormal results are displayed)  Labs Reviewed - No data to display ____________________________________________  EKG   ____________________________________________  RADIOLOGY I, 10/19/20, personally viewed and evaluated these images (plain radiographs) as part of my medical decision making, as well as reviewing the written report by the radiologist.  ED MD interpretation:    Official radiology report(s): No results found.  ____________________________________________   PROCEDURES  Procedure(s) performed (including Critical Care):  Procedures   ____________________________________________   INITIAL IMPRESSION / ASSESSMENT AND PLAN / ED COURSE  As part of my medical decision making, I reviewed the following data within the electronic MEDICAL RECORD NUMBER         Patient presents with abscess to the right lower buttocks.  See procedure note for incision and drainage.  Patient given discharge care instruction advised take medication as directed.  Return back in 2 days for wound check.      ____________________________________________   FINAL CLINICAL IMPRESSION(S) / ED DIAGNOSES  Final diagnoses:  Abscess of buttock, right     ED Discharge Orders         Ordered    sulfamethoxazole-trimethoprim (BACTRIM DS) 800-160 MG tablet  2 times daily        10/17/20 1522    naproxen (NAPROSYN) 500 MG tablet  2 times daily with meals        10/17/20 1522    oxyCODONE-acetaminophen (PERCOCET) 7.5-325 MG tablet  Every 6 hours PRN        10/17/20 1522          *Please note:  Jose Mays was evaluated in Emergency Department on 10/17/2020 for the symptoms described in the history of present illness. He  was evaluated in the context of the global COVID-19 pandemic, which necessitated consideration that the patient might be at risk for infection with the SARS-CoV-2 virus that causes COVID-19. Institutional protocols and algorithms that pertain to the evaluation of patients at risk for COVID-19 are in a state of rapid change based on information released by regulatory bodies including the CDC and federal and state organizations. These policies and algorithms were followed during the patient's care in the ED.  Some ED evaluations and interventions may be delayed as a result of limited staffing during and the pandemic.*   Note:  This document was prepared using Dragon voice recognition software and may include unintentional dictation errors.    10/19/2020, PA-C 10/17/20 1537    10/19/20, MD 10/18/20 [phone]

## 2020-10-17 NOTE — Discharge Instructions (Signed)
All discharge care instructions and return back in 2 days for wound check.  Take medication as directed.

## 2020-10-17 NOTE — ED Triage Notes (Signed)
Pt comes with c/o boil on his buttocks. Pt states this started 4-5 days ago. Pt states he took some old penicillin for two days.  Pt states it is now hard and won't come out.

## 2020-10-17 NOTE — ED Notes (Signed)
See triage note  Presents with possible abscess area to buttocks  Hx of same in past  Noticed area about 3 days ago

## 2020-11-21 ENCOUNTER — Other Ambulatory Visit: Payer: Self-pay

## 2020-11-21 ENCOUNTER — Emergency Department
Admission: EM | Admit: 2020-11-21 | Discharge: 2020-11-22 | Disposition: A | Discharge status: Home / Self Care | Payer: Medicaid Other | Attending: Emergency Medicine | Admitting: Emergency Medicine

## 2020-11-21 ENCOUNTER — Encounter: Payer: Self-pay | Admitting: Emergency Medicine

## 2020-11-21 DIAGNOSIS — S41112A Laceration without foreign body of left upper arm, initial encounter: Secondary | ICD-10-CM | POA: Insufficient documentation

## 2020-11-21 DIAGNOSIS — F17200 Nicotine dependence, unspecified, uncomplicated: Secondary | ICD-10-CM | POA: Insufficient documentation

## 2020-11-21 DIAGNOSIS — S51812A Laceration without foreign body of left forearm, initial encounter: Secondary | ICD-10-CM | POA: Insufficient documentation

## 2020-11-21 DIAGNOSIS — Z23 Encounter for immunization: Secondary | ICD-10-CM | POA: Insufficient documentation

## 2020-11-21 DIAGNOSIS — S20319A Abrasion of unspecified front wall of thorax, initial encounter: Secondary | ICD-10-CM | POA: Insufficient documentation

## 2020-11-21 MED ORDER — BACITRACIN ZINC 500 UNIT/GM EX OINT
TOPICAL_OINTMENT | Freq: Once | CUTANEOUS | Status: AC
  Filled 2020-11-21: qty 0.9

## 2020-11-21 MED ORDER — LIDOCAINE-EPINEPHRINE (PF) 2 %-1:200000 IJ SOLN: 20.0000 mL | Freq: Once | INTRAMUSCULAR | Status: DC

## 2020-11-21 MED ORDER — TETANUS-DIPHTH-ACELL PERTUSSIS 5-2.5-18.5 LF-MCG/0.5 IM SUSY
0.5000 mL | PREFILLED_SYRINGE | Freq: Once | INTRAMUSCULAR | Status: AC
  Administered 2020-11-22: 0.5 mL via INTRAMUSCULAR
  Filled 2020-11-21: qty 0.5
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
## 2020-11-22 NOTE — Discharge Instructions (Addendum)
You may alternate Tylenol 1000 mg every 6 hours as needed for pain, fever and Ibuprofen 800 mg every 8 hours as needed for pain, fever.  Please take Ibuprofen with food.  Do not take more than 4000 mg of Tylenol (acetaminophen) in a 24 hour period.  You may clean your wounds gently with warm soap and water 1-2 times a day.  It is okay to take a shower.  You may apply over-the-counter Neosporin ointment to your wound 1-2 times a day.  I would not go swimming at this time in a pool, lake or other body of water into your wound has completely healed.  You do not need antibiotics.  Your tetanus vaccination has been updated today.  When your wound has completely healed if you have a scar that you were concerned about you may use over-the-counter Mederma to help with decreasing the scar.  Given your laceration is so superficial, does not require stitches today.  Steps to find a Primary Care Provider (PCP):  Call [phone] or [phone] to access "[place] Find a Doctor Service."  2.  You may also go on the [facility] website at [email or web address]

## 2020-11-29 ENCOUNTER — Emergency Department
Admission: EM | Admit: 2020-11-29 | Discharge: 2020-11-30 | Disposition: A | Discharge status: Home / Self Care | Payer: Medicaid Other | Attending: Student in an Organized Health Care Education/Training Program | Admitting: Student in an Organized Health Care Education/Training Program

## 2020-11-29 ENCOUNTER — Other Ambulatory Visit: Payer: Self-pay

## 2020-11-29 DIAGNOSIS — R112 Nausea with vomiting, unspecified: Secondary | ICD-10-CM | POA: Insufficient documentation

## 2020-11-29 DIAGNOSIS — F17200 Nicotine dependence, unspecified, uncomplicated: Secondary | ICD-10-CM | POA: Insufficient documentation

## 2020-11-29 LAB — COMPREHENSIVE METABOLIC PANEL
ALT: 18 U/L (ref 0–44)
AST: 30 U/L (ref 15–41)
Albumin: 4.8 g/dL (ref 3.5–5.0)
Alkaline Phosphatase: 84 U/L (ref 38–126)
Anion gap: 12 (ref 5–15)
BUN: 9 mg/dL (ref 6–20)
CO2: 23 mmol/L (ref 22–32)
Calcium: 9.7 mg/dL (ref 8.9–10.3)
Chloride: 103 mmol/L (ref 98–111)
Creatinine, Ser: 1.11 mg/dL (ref 0.61–1.24)
GFR, Estimated: >60 mL/min (ref >60)
Glucose, Bld: 147 mg/dL — ABNORMAL HIGH (ref 70–99)
Potassium: 3.1 mmol/L — ABNORMAL LOW (ref 3.5–5.1)
Sodium: 138 mmol/L (ref 135–145)
Total Bilirubin: 1 mg/dL (ref 0.3–1.2)
Total Protein: 9 g/dL — ABNORMAL HIGH (ref 6.5–8.1)

## 2020-11-29 LAB — LIPASE, BLOOD: Lipase: 24 U/L (ref 11–51)

## 2020-11-29 LAB — CBC
HCT: 45.7 % (ref 39.0–52.0)
Hemoglobin: 15.6 g/dL (ref 13.0–17.0)
MCH: 31.4 pg (ref 26.0–34.0)
MCHC: 34.1 g/dL (ref 30.0–36.0)
MCV: 92 fL (ref 80.0–100.0)
Platelets: 482 10*3/uL — ABNORMAL HIGH (ref 150–400)
RBC: 4.97 MIL/uL (ref 4.22–5.81)
RDW: 12.7 % (ref 11.5–15.5)
WBC: 10.7 10*3/uL — ABNORMAL HIGH (ref 4.0–10.5)
nRBC: 0 % (ref 0.0–0.2)

## 2020-11-29 MED ORDER — LORAZEPAM 2 MG/ML IJ SOLN: 1.0000 mg | Freq: Once | INTRAMUSCULAR | Status: DC

## 2020-11-29 MED ORDER — ONDANSETRON HCL 4 MG/2ML IJ SOLN
4.0000 mg | Freq: Once | INTRAMUSCULAR | Status: AC | PRN
  Administered 2020-11-29: 4 mg via INTRAVENOUS
  Filled 2020-11-29: qty 2

## 2020-11-29 MED ORDER — SODIUM CHLORIDE 0.9 % IV BOLUS
1000.0000 mL | Freq: Once | INTRAVENOUS | Status: AC
  Administered 2020-11-29: 1000 mL via INTRAVENOUS

## 2020-11-29 MED ORDER — SODIUM CHLORIDE 0.9 % IV SOLN
12.5000 mg | Freq: Four times a day (QID) | INTRAVENOUS | Status: DC | PRN
  Administered 2020-11-30: 12.5 mg via INTRAVENOUS
  Filled 2020-11-29: qty 0.5

## 2020-11-29 MED ORDER — DROPERIDOL 2.5 MG/ML IJ SOLN
2.5000 mg | Freq: Once | INTRAMUSCULAR | Status: AC
  Administered 2020-11-29: 2.5 mg via INTRAVENOUS
  Filled 2020-11-29: qty 2

## 2020-11-29 NOTE — ED Notes (Signed)
Pt laying on floor, encouraged to rest in bed but pt declines, visitor at bedside with patient, call bell in reach; pt denies vomiting for the past 30-45 min s/p droperidol admin; pt a/o x 4, GCS 15, VSS

## 2020-11-29 NOTE — ED Provider Notes (Signed)
[facility] Emergency Department Provider Note  Time seen: 9:47 PM  I have reviewed the triage vital signs and the nursing notes.   HISTORY  Chief Complaint Emesis   HPI Jose Mays is a 25 y.o. male with no past medical history presents to the emergency department for nausea and vomiting.  According to the patient he woke up at 5:00 this morning with severe nausea and vomiting.  Patient denies any diarrhea.  Denies any significant abdominal pain.  Patient actively nauseated and retching in the emergency department.  Patient states this has occurred before but never to this extent.  Past Medical History:  Diagnosis Date  . Abscess     Patient Active Problem List   Diagnosis Date Noted  . Acute gastroenteritis 05/13/2017  . Hypokalemia 05/23/2016  . Nausea & vomiting 05/23/2016    Past Surgical History:  Procedure Laterality Date  . TONSILLECTOMY      Prior to Admission medications   Medication Sig Start Date End Date Taking? Authorizing Provider  dicyclomine (BENTYL) 10 MG capsule Take 1 capsule (10 mg total) by mouth 3 (three) times daily as needed for up to 14 days (abd pain). 09/23/20 10/07/20  Phineas Semen, MD  meloxicam (MOBIC) 15 MG tablet Take 1 tablet (15 mg total) by mouth daily. 08/06/20 08/06/21  Orvil Feil, PA-C  naproxen (NAPROSYN) 500 MG tablet Take 1 tablet (500 mg total) by mouth 2 (two) times daily with a meal. 10/17/20   Joni Reining, PA-C  omeprazole (PRILOSEC) 20 MG capsule Take 1 capsule (20 mg total) by mouth daily. Patient not taking: Reported on 05/13/2017 05/24/16   Hower, Cletis Athens, MD  ondansetron (ZOFRAN ODT) 4 MG disintegrating tablet Take 1 tablet (4 mg total) by mouth every 8 (eight) hours as needed for nausea or vomiting. 05/14/17   Katha Hamming, MD  ondansetron (ZOFRAN) 4 MG tablet Take 1 tablet (4 mg total) by mouth every 8 (eight) hours as needed. 09/23/20   Phineas Semen, MD  oxyCODONE-acetaminophen  (PERCOCET) 7.5-325 MG tablet Take 1 tablet by mouth every 6 (six) hours as needed. 10/17/20   Joni Reining, PA-C  sulfamethoxazole-trimethoprim (BACTRIM DS) 800-160 MG tablet Take 1 tablet by mouth 2 (two) times daily. 10/17/20   Joni Reining, PA-C    No Known Allergies  Family History  Problem Relation Age of Onset  . Prostate cancer Paternal Uncle   . Rheum arthritis Paternal Grandmother   . Bladder Cancer Neg Hx     Social History Social History   Tobacco Use  . Smoking status: Current Every Day Smoker  . Smokeless tobacco: Never Used  Substance Use Topics  . Alcohol use: No  . Drug use: No    Review of Systems Constitutional: Negative for fever. Cardiovascular: Negative for chest pain. Respiratory: Negative for shortness of breath. Gastrointestinal: Negative for abdominal pain.  Positive for nausea vomiting.  Negative for diarrhea. Musculoskeletal: Negative for musculoskeletal complaints Neurological: Negative for headache All other ROS negative  ____________________________________________   PHYSICAL EXAM:  VITAL SIGNS: ED Triage Vitals  Enc Vitals Group     BP 11/29/20 1737 (!) 140/116     Pulse Rate 11/29/20 1737 82     Resp 11/29/20 1737 18     Temp 11/29/20 1737 (!) 97.5 F (36.4 C)     Temp Source 11/29/20 1737 Axillary     SpO2 11/29/20 1737 99 %     Weight --  Height 11/29/20 1740 5\' 6"  (1.676 m)     Head Circumference --      Peak Flow --      Pain Score 11/29/20 1740 10     Pain Loc --      Pain Edu? --      Excl. in GC? --    Constitutional: Alert and oriented.  Patient appears nauseated.  Mildly diaphoretic. Eyes: Normal exam ENT      Head: Normocephalic and atraumatic.      Mouth/Throat: Mucous membranes are moist. Cardiovascular: Normal rate, regular rhythm.  Respiratory: Normal respiratory effort without tachypnea nor retractions. Breath sounds are clear Gastrointestinal: Soft and nontender. No distention.  Musculoskeletal:  Nontender with normal range of motion in all extremities.  Neurologic:  Normal speech and language. No gross focal neurologic deficits  Skin:  Skin is warm.  Mild diaphoresis. Psychiatric: Mood and affect are normal.  ____________________________________________   EKG viewed and interpreted by myself shows a normal sinus rhythm at 51 bpm with a narrow QRS, normal axis, normal intervals, no concerning ST changes.  INITIAL IMPRESSION / ASSESSMENT AND PLAN / ED COURSE  Pertinent labs & imaging results that were available during my care of the patient were reviewed by me and considered in my medical decision making (see chart for details).   Patient presents emergency department for nausea and vomiting.  Patient actively retching in the emergency department.  Patient denies any abdominal pain besides nausea of sensation.  Denies any diarrhea.  No fever.  Vital signs are reassuring.  Lab work is reassuring.  We will IV hydrate.  We will dose droperidol and reassess.  Differential would include gastritis, gastroenteritis, patient is a marijuana user could be cannabinoid induced hyperemesis or cyclical vomiting syndrome.  Patient is feeling better after droperidol but continues to feel nauseated no further vomiting.  We will dose a one-time dose of Phenergan and reassess.  Patient receiving IV fluids.  Patient care signed out to oncoming provider.  Jose Mays was evaluated in Emergency Department on 11/29/2020 for the symptoms described in the history of present illness. He was evaluated in the context of the global COVID-19 pandemic, which necessitated consideration that the patient might be at risk for infection with the SARS-CoV-2 virus that causes COVID-19. Institutional protocols and algorithms that pertain to the evaluation of patients at risk for COVID-19 are in a state of rapid change based on information released by regulatory bodies including the CDC and federal and state organizations. These  policies and algorithms were followed during the patient's care in the ED.  ____________________________________________   FINAL CLINICAL IMPRESSION(S) / ED DIAGNOSES  Nausea vomiting   01/29/2021, MD 11/29/20 2327

## 2020-11-29 NOTE — ED Notes (Signed)
Pt lying on floor on arrival to room, assisted back to bed and placed on cardiac monitor; pt c/o hyperemesis, sts hx of same, a/o x 4 GCS 15

## 2020-11-29 NOTE — ED Triage Notes (Signed)
First Nurse Note:  Arrives via [facility].  C?O stomach pains and vomiting since 5 am.  Per report, patient refused EMS assessment and Vital Signs.  Patient AAOx3.  Skin warm and dry. NAD

## 2020-11-29 NOTE — ED Triage Notes (Addendum)
See first nurse note- pt to triage room with stomach pains and vomiting, diarrhea since 5am. Pt laying on floor in triage room dry heaving, rolling around. Unable to stay still, multiple times pt stands, walks to bathroom, dry heaves, returns to triage room and lays in floor. Pt sweating profusely. States he "just wants something to help me sleep". First nurse aware.

## 2020-11-29 NOTE — ED Notes (Signed)
Pt continues to lay himself on floor, provided with emesis bag and reinforced need to stay in bed, pt verbalizes understanding but pulled off EKG leads, refusing to lay still to reapply; pt medicated with ordered droperidol

## 2020-11-30 NOTE — ED Provider Notes (Signed)
  Patient received in signout from Dr. Lenard Lance pending clinical reassessment after IV fluids and Phenergan dosing for possible cannabis hyperemesis syndrome.  I reevaluate the patient and he reports significantly improved symptoms after Phenergan.  We discussed p.o. challenge and outpatient management.  I discussed with him my recommendations for cannabis cessation.  We discussed return precautions for the ED and patient stable for outpatient management.   Delton Prairie, MD 11/30/20 [phone]

## 2020-11-30 NOTE — ED Notes (Signed)
PO challenge started, pt denies n/v at this time

## 2020-11-30 NOTE — ED Notes (Signed)
Pt passed PO challenge, ED MD made aware

## 2020-11-30 NOTE — ED Notes (Signed)
Pt now resting in bed, no behaviors of nausea noted on exam, pt more cooperative and in less distress than before, A/O X 4 GCS 15
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
## 2022-07-17 ENCOUNTER — Other Ambulatory Visit: Payer: Self-pay

## 2022-07-17 ENCOUNTER — Encounter: Payer: Self-pay | Admitting: Emergency Medicine

## 2022-07-17 DIAGNOSIS — L02214 Cutaneous abscess of groin: Secondary | ICD-10-CM | POA: Insufficient documentation

## 2022-07-17 NOTE — ED Triage Notes (Signed)
Pt via POV from home. Pt states he is here for multiple boils. States that the first one showed up this past, states that they have popped but ended up coming. Pt states that they are in groin area.

## 2022-07-18 ENCOUNTER — Emergency Department
Admission: EM | Admit: 2022-07-18 | Discharge: 2022-07-18 | Disposition: A | Discharge status: Home / Self Care | Payer: Medicaid Other | Attending: Emergency Medicine | Admitting: Emergency Medicine

## 2022-07-18 DIAGNOSIS — L02214 Cutaneous abscess of groin: Secondary | ICD-10-CM

## 2022-07-18 MED ORDER — CLINDAMYCIN PHOSPHATE 1 % EX GEL: Freq: Two times a day (BID) | CUTANEOUS | 1 refills | Status: DC

## 2022-07-18 MED ORDER — KETOROLAC TROMETHAMINE 10 MG PO TABS: 10.0000 mg | ORAL_TABLET | Freq: Four times a day (QID) | ORAL | 0 refills | Status: AC | PRN

## 2022-07-18 MED ORDER — SULFAMETHOXAZOLE-TRIMETHOPRIM 800-160 MG PO TABS
1.0000 | ORAL_TABLET | Freq: Once | ORAL | Status: AC
  Administered 2022-07-18: 1 via ORAL
  Filled 2022-07-18: qty 1

## 2022-07-18 MED ORDER — SULFAMETHOXAZOLE-TRIMETHOPRIM 800-160 MG PO TABS: 1.0000 | ORAL_TABLET | Freq: Two times a day (BID) | ORAL | 0 refills | Status: DC

## 2022-07-18 MED ORDER — CEPHALEXIN 500 MG PO CAPS
500.0000 mg | ORAL_CAPSULE | Freq: Once | ORAL | Status: AC
  Administered 2022-07-18: 500 mg via ORAL
  Filled 2022-07-18: qty 1

## 2022-07-18 MED ORDER — CEPHALEXIN 500 MG PO CAPS: 500.0000 mg | ORAL_CAPSULE | Freq: Three times a day (TID) | ORAL | 0 refills | Status: DC

## 2022-07-18 MED ORDER — KETOROLAC TROMETHAMINE 10 MG PO TABS
10.0000 mg | ORAL_TABLET | Freq: Once | ORAL | Status: AC
  Administered 2022-07-18: 10 mg via ORAL
  Filled 2022-07-18: qty 1

## 2022-07-18 NOTE — ED Provider Notes (Signed)
[facility] Provider Note    Event Date/Time   First MD Initiated Contact with Patient 07/18/22 [phone]     (approximate)   History   Abscess   HPI  Jose Mays is a 26 y.o. male with a past history of continuous abscesses who comes ED complaining of groin abscesses which are draining over the past over the past week.  Denies fever or chills, no penile discharge, no dysuria.  Has some moderate pain, but nothing severe.  No trauma to the area.  States that this has been a recurrent issue with throughout his life since he was about 26 years old.     Physical Exam   Triage Vital Signs: ED Triage Vitals  Enc Vitals Group     BP 07/17/22 1829 (!) 148/95     Pulse Rate 07/17/22 1829 (!) 106     Resp 07/17/22 1834 18     Temp 07/17/22 1829 98.6 F (37 C)     Temp Source 07/17/22 1829 Oral     SpO2 07/17/22 1829 99 %     Weight 07/17/22 1832 185 lb (83.9 kg)     Height 07/17/22 1830 5\' 5"  (1.651 m)     Head Circumference --      Peak Flow --      Pain Score 07/17/22 1832 8     Pain Loc --      Pain Edu? --      Excl. in GC? --     Most recent vital signs: Vitals:   07/17/22 1834 07/18/22 0140  BP:  111/69  Pulse:  79  Resp: 18 15  Temp:  98.5 F (36.9 C)  SpO2:  100%    General: Awake, no distress.  CV:  Good peripheral perfusion.  Resp:  Normal effort.  Abd:  No distention.  Soft nontender Other:  Normal genital anatomy.  He has superficial swelling erythema and purulent drainage bilaterally on the scrotum and proximal thighs and inguinal fold.  There is some scarring from prior incision and drainage.  Appearance consistent with hidradenitis suppurativa.  No crepitus.   ED Results / Procedures / Treatments   Labs (all labs ordered are listed, but only abnormal results are displayed) Labs Reviewed - No data to display   RADIOLOGY    PROCEDURES:  Procedures   MEDICATIONS ORDERED IN ED: Medications   sulfamethoxazole-trimethoprim (BACTRIM DS) 800-160 MG per tablet 1 tablet (1 tablet Oral Given 07/18/22 0139)  cephALEXin (KEFLEX) capsule 500 mg (500 mg Oral Given 07/18/22 0139)  ketorolac (TORADOL) tablet 10 mg (10 mg Oral Given 07/18/22 0139)     IMPRESSION / MDM / ASSESSMENT AND PLAN / ED COURSE  I reviewed the triage vital signs and the nursing notes.                              Patient presents with cutaneous infection, clinically consistent with hidradenitis suppurativa.  Will start on oral antibiotics, NSAIDs.  Prescription for topical clindamycin as well for future use.  Recommend he follow-up with primary care or dermatology for long-term management.      FINAL CLINICAL IMPRESSION(S) / ED DIAGNOSES   Final diagnoses:  Groin abscess     Rx / DC Orders   ED Discharge Orders          Ordered    cephALEXin (KEFLEX) 500 MG capsule  3 times daily  07/18/22 0145    sulfamethoxazole-trimethoprim (BACTRIM DS) 800-160 MG tablet  2 times daily        07/18/22 0145    clindamycin (CLINDAGEL) 1 % gel  2 times daily        07/18/22 0145    ketorolac (TORADOL) 10 MG tablet  Every 6 hours PRN       Note to Pharmacy: Continuation of parenteral therapy   07/18/22 0145             Note:  This document was prepared using Dragon voice recognition software and may include unintentional dictation errors.   Sharman Cheek, MD 07/18/22 [phone]

## 2022-07-18 NOTE — Discharge Instructions (Addendum)
Take keflex and Bactrim for the skin infection.   This infection may be due to a condition called Hidradenitis Suppurativa.  This condition can be effectively treated with topical clindamycin.  Next time this starts to flare up, you can try using the clindamycin gel on the area for quick resolution.

## 2022-08-09 ENCOUNTER — Emergency Department: Payer: Medicaid Other

## 2022-08-09 ENCOUNTER — Other Ambulatory Visit: Payer: Self-pay

## 2022-08-09 ENCOUNTER — Inpatient Hospital Stay
Admission: EM | Admit: 2022-08-09 | Discharge: 2022-08-11 | DRG: 718 | Disposition: A | Discharge status: Home / Self Care | Payer: Medicaid Other | Attending: Internal Medicine | Admitting: Internal Medicine

## 2022-08-09 DIAGNOSIS — Z6833 Body mass index (BMI) 33.0-33.9, adult: Secondary | ICD-10-CM

## 2022-08-09 DIAGNOSIS — N492 Inflammatory disorders of scrotum: Secondary | ICD-10-CM | POA: Diagnosis present

## 2022-08-09 DIAGNOSIS — Z79899 Other long term (current) drug therapy: Secondary | ICD-10-CM | POA: Diagnosis not present

## 2022-08-09 DIAGNOSIS — L732 Hidradenitis suppurativa: Secondary | ICD-10-CM | POA: Diagnosis present

## 2022-08-09 DIAGNOSIS — E876 Hypokalemia: Secondary | ICD-10-CM | POA: Diagnosis present

## 2022-08-09 DIAGNOSIS — E669 Obesity, unspecified: Secondary | ICD-10-CM | POA: Diagnosis present

## 2022-08-09 DIAGNOSIS — F17210 Nicotine dependence, cigarettes, uncomplicated: Secondary | ICD-10-CM | POA: Diagnosis present

## 2022-08-09 DIAGNOSIS — L0292 Furuncle, unspecified: Secondary | ICD-10-CM | POA: Diagnosis present

## 2022-08-09 DIAGNOSIS — N5089 Other specified disorders of the male genital organs: Secondary | ICD-10-CM | POA: Diagnosis not present

## 2022-08-09 LAB — URINALYSIS, ROUTINE W REFLEX MICROSCOPIC
Bacteria, UA: NONE SEEN
Bilirubin Urine: NEGATIVE
Glucose, UA: NEGATIVE mg/dL
Hgb urine dipstick: NEGATIVE
Ketones, ur: NEGATIVE mg/dL
Nitrite: NEGATIVE
Protein, ur: 30 mg/dL — AB
Specific Gravity, Urine: 1.03 (ref 1.005–1.030)
pH: 7 (ref 5.0–8.0)

## 2022-08-09 LAB — COMPREHENSIVE METABOLIC PANEL
ALT: 16 U/L (ref 0–44)
AST: 28 U/L (ref 15–41)
Albumin: 3.6 g/dL (ref 3.5–5.0)
Alkaline Phosphatase: 71 U/L (ref 38–126)
Anion gap: 7 (ref 5–15)
BUN: 14 mg/dL (ref 6–20)
CO2: 28 mmol/L (ref 22–32)
Calcium: 8.1 mg/dL — ABNORMAL LOW (ref 8.9–10.3)
Chloride: 100 mmol/L (ref 98–111)
Creatinine, Ser: 0.91 mg/dL (ref 0.61–1.24)
GFR, Estimated: >60 mL/min (ref >60)
Glucose, Bld: 96 mg/dL (ref 70–99)
Potassium: 3.1 mmol/L — ABNORMAL LOW (ref 3.5–5.1)
Sodium: 135 mmol/L (ref 135–145)
Total Bilirubin: 0.7 mg/dL (ref 0.3–1.2)
Total Protein: 7.5 g/dL (ref 6.5–8.1)

## 2022-08-09 LAB — CBC
HCT: 37.3 % — ABNORMAL LOW (ref 39.0–52.0)
Hemoglobin: 12.3 g/dL — ABNORMAL LOW (ref 13.0–17.0)
MCH: 31.1 pg (ref 26.0–34.0)
MCHC: 33 g/dL (ref 30.0–36.0)
MCV: 94.4 fL (ref 80.0–100.0)
Platelets: 322 10*3/uL (ref 150–400)
RBC: 3.95 MIL/uL — ABNORMAL LOW (ref 4.22–5.81)
RDW: 12.1 % (ref 11.5–15.5)
WBC: 14.2 10*3/uL — ABNORMAL HIGH (ref 4.0–10.5)
nRBC: 0 % (ref 0.0–0.2)

## 2022-08-09 LAB — CHLAMYDIA/NGC RT PCR (ARMC ONLY)
Chlamydia Tr: NOT DETECTED
N gonorrhoeae: NOT DETECTED

## 2022-08-09 MED ORDER — ONDANSETRON HCL 4 MG/2ML IJ SOLN
4.0000 mg | Freq: Once | INTRAMUSCULAR | Status: AC
  Administered 2022-08-09: 4 mg via INTRAVENOUS
  Filled 2022-08-09: qty 2

## 2022-08-09 MED ORDER — SODIUM CHLORIDE 0.9 % IV BOLUS
1000.0000 mL | Freq: Once | INTRAVENOUS | Status: AC
  Administered 2022-08-09: 1000 mL via INTRAVENOUS

## 2022-08-09 MED ORDER — MORPHINE SULFATE (PF) 4 MG/ML IV SOLN
4.0000 mg | Freq: Once | INTRAVENOUS | Status: AC
  Administered 2022-08-09: 4 mg via INTRAVENOUS
  Filled 2022-08-09: qty 1

## 2022-08-09 MED ORDER — POLYETHYLENE GLYCOL 3350 17 G PO PACK: 17.0000 g | PACK | Freq: Every day | ORAL | Status: DC | PRN

## 2022-08-09 MED ORDER — OXYCODONE HCL 5 MG PO TABS
5.0000 mg | ORAL_TABLET | ORAL | Status: DC | PRN
  Administered 2022-08-09: 5 mg via ORAL
  Filled 2022-08-09: qty 1

## 2022-08-09 MED ORDER — TRAZODONE HCL 50 MG PO TABS: 50.0000 mg | ORAL_TABLET | Freq: Every evening | ORAL | Status: DC | PRN

## 2022-08-09 MED ORDER — IOHEXOL 300 MG/ML  SOLN
100.0000 mL | Freq: Once | INTRAMUSCULAR | Status: AC | PRN
  Administered 2022-08-09: 100 mL via INTRAVENOUS

## 2022-08-09 MED ORDER — ONDANSETRON HCL 4 MG PO TABS: 4.0000 mg | ORAL_TABLET | Freq: Four times a day (QID) | ORAL | Status: DC | PRN

## 2022-08-09 MED ORDER — SODIUM CHLORIDE 0.9% FLUSH
3.0000 mL | Freq: Two times a day (BID) | INTRAVENOUS | Status: DC
  Administered 2022-08-09 – 2022-08-11 (×3): 3 mL via INTRAVENOUS

## 2022-08-09 MED ORDER — LACTATED RINGERS IV SOLN: INTRAVENOUS | Status: DC

## 2022-08-09 MED ORDER — CLINDAMYCIN PHOSPHATE 600 MG/50ML IV SOLN
600.0000 mg | Freq: Once | INTRAVENOUS | Status: AC
  Administered 2022-08-09: 600 mg via INTRAVENOUS
  Filled 2022-08-09: qty 50

## 2022-08-09 MED ORDER — MORPHINE SULFATE (PF) 2 MG/ML IV SOLN: 2.0000 mg | INTRAVENOUS | Status: DC | PRN

## 2022-08-09 MED ORDER — IBUPROFEN 400 MG PO TABS: 400.0000 mg | ORAL_TABLET | Freq: Four times a day (QID) | ORAL | Status: DC | PRN

## 2022-08-09 MED ORDER — ONDANSETRON HCL 4 MG/2ML IJ SOLN: 4.0000 mg | Freq: Four times a day (QID) | INTRAMUSCULAR | Status: DC | PRN

## 2022-08-09 NOTE — ED Provider Notes (Signed)
[facility] Provider Note    Event Date/Time   First MD Initiated Contact with Patient 08/09/22 1855     (approximate)  History   Chief Complaint: Testicle Pain  HPI  Dayson Bonfanti is a 27 y.o. male with a past medical history of prior abscesses presents to the emergency department for scrotal pain and swelling.  According to the patient he noted yesterday an abscess to his scrotum, patient states today the swelling has enlarged significantly to now include the entire scrotum as well as penis.  Patient states moderate pain throughout the area.  No fever, afebrile in the emergency department.  Vital signs are reassuring.  Patient denies any dysuria denies any hematuria denies any urethral discharge.  Physical Exam   Triage Vital Signs: ED Triage Vitals  Enc Vitals Group     BP 08/09/22 1826 128/69     Pulse Rate 08/09/22 1826 93     Resp 08/09/22 1826 16     Temp 08/09/22 1826 98.7 F (37.1 C)     Temp src --      SpO2 08/09/22 1826 100 %     Weight --      Height --      Head Circumference --      Peak Flow --      Pain Score 08/09/22 1719 7     Pain Loc --      Pain Edu? --      Excl. in [place]? --     Most recent vital signs: Vitals:   08/09/22 1826  BP: 128/69  Pulse: 93  Resp: 16  Temp: 98.7 F (37.1 C)  SpO2: 100%    General: Awake, no distress.  CV:  Good peripheral perfusion.  Regular rate and rhythm  Resp:  Normal effort.  Equal breath sounds bilaterally.  Abd:  No distention.  Soft, nontender.  No rebound or guarding. Other:  Patient has significant scrotal edema and tenderness to palpation about the entire scrotum.  Mild amount of penile edema as well.  Entire area is tender to palpation.   ED Results / Procedures / Treatments   RADIOLOGY  CT pending   MEDICATIONS ORDERED IN ED: Medications  morphine (PF) 4 MG/ML injection 4 mg (has no administration in time range)  ondansetron (ZOFRAN) injection 4 mg (has no  administration in time range)  sodium chloride 0.9 % bolus 1,000 mL (has no administration in time range)     IMPRESSION / MDM / ASSESSMENT AND PLAN / ED COURSE  I reviewed the triage vital signs and the nursing notes.  Patient's presentation is most consistent with acute presentation with potential threat to life or bodily function.  Patient presents emergency department for scrotal swelling and pain.  Patient states he noticed a boil he believes 2 days ago or so but states yesterday it seemed to increase and today be came very large swelling his entire scrotum and penis so he came to the emergency department for evaluation.  Patient denies any history of diabetes.  Vital signs are reassuring.  No fever at home.  On exam patient has significant swelling of the entire scrotum.  Ultrasound shows no concerning findings in the testicles however marked scrotal wall thickening with cellulitis and a 3.4 cm fluid collection suspicious for an abscess.  Given the significant physical exam findings and ultrasound findings we will proceed with a CT scan with contrast to further evaluate.  We will check labs and continue to  closely monitor.  We will also start IV clindamycin.  Patient care signed out to oncoming provider.  CT pending  FINAL CLINICAL IMPRESSION(S) / ED DIAGNOSES   Scrotal cellulitis   Note:  This document was prepared using Dragon voice recognition software and may include unintentional dictation errors.   Harvest Dark, MD 08/10/22 [phone]

## 2022-08-09 NOTE — Assessment & Plan Note (Addendum)
scrotal abscess seen on ultrasound, urology has been consulted and plan to evaluate today to decide need for I&D.  Based on history and exam strong suspicion for hidradenitis suppurativa, he is strongly recommended to seek care with a dermatologist as an outpatient to see what kind of preventative treatments he can engage with. - Continue clindamycin - N.p.o. for now until decision for I&D by urology - Pain meds as needed

## 2022-08-09 NOTE — Progress Notes (Signed)
Brief urology note, full consult to follow  27 year old male with personal history of recurrent scrotal abscess presenting with scrotal pain and swelling.  Scrotal ultrasound shows very small approximately 3.5 by 0.5 cm elongated area in the left hemiscrotum representing possible abscess although no discernible or discrete drainable collection on exam per ER MD x 2.  CT scan shows no evidence of Fournier's gangrene but does show diffuse scrotal swelling without obvious discrete fluid collection.  -Agree with admission for IV antibiotics for treatment of cellulitis/ scrotal edema -Will examine patient again in the morning to assess whether or not there seems to be of drainable fluid collection tomorrow -Consider OR I&D tomorrow if there is a discernible collection at that time, we will tentatively book case for midday and cancel as needed -N.p.o. at midnight -Agree with IV clindamycin  Hollice Espy, MD

## 2022-08-09 NOTE — ED Triage Notes (Signed)
Pt comes with c/o testicle pain. Pt states this just started today. Pt states it started out as boil. Pt states now swelling and pain.

## 2022-08-09 NOTE — H&P (Signed)
History and Physical    Patient: Jose Mays UJW:[id number] DOB: 1996/01/06 DOA: 08/09/2022 DOS: the patient was seen and examined on 08/09/2022 PCP: Pcp, No  Patient coming from: Home  Chief Complaint:  Chief Complaint  Patient presents with   Testicle Pain   HPI: Jose Mays is a 27 y.o. male with medical history significant for recurrent scrotal abscesses who presents with concern for scrotal abscess.  Patient reports numerous visits to the hospital for scrotal abscesses.  He estimates over the past several years that he has had over 50.  He denies any shaving as he has been told in the past that this would make things worse.  No burning or pain with urination, no unprotected sex or new partners.  In the ED vital signs were unremarkable.  CMP was unremarkable except for mild hypokalemia.  CBC showed elevated white count at 14.2 and borderline anemia.  Urine gonorrhea and chlamydia was negative.  Ultrasound of the scrotum was negative for torsion and showed findings concerning for cellulitis as well as an irregular 3.4 cm collection concerning for abscess.  CT pelvis with contrast was also obtained which did not show any significant new findings compared to ultrasound.  He was started on clindamycin.  Urology was consulted and plan to see patient in a.m. for likely I&D.  He was admitted for further management.   Review of Systems: As mentioned in the history of present illness. All other systems reviewed and are negative. Past Medical History:  Diagnosis Date   Abscess    Past Surgical History:  Procedure Laterality Date   TONSILLECTOMY     Social History:  reports that he has been smoking. He has never used smokeless tobacco. He reports that he does not drink alcohol and does not use drugs.  No Known Allergies  Family History  Problem Relation Age of Onset   Prostate cancer Paternal Uncle    Rheum arthritis Paternal Grandmother    Bladder Cancer Neg Hx     Prior to  Admission medications   Medication Sig Start Date End Date Taking? Authorizing Provider  cephALEXin (KEFLEX) 500 MG capsule Take 1 capsule (500 mg total) by mouth 3 (three) times daily. 07/18/22   Sharman Cheek, MD  clindamycin (CLINDAGEL) 1 % gel Apply topically 2 (two) times daily. 07/18/22   Sharman Cheek, MD  dicyclomine (BENTYL) 10 MG capsule Take 1 capsule (10 mg total) by mouth 3 (three) times daily as needed for up to 14 days (abd pain). 09/23/20 10/07/20  Phineas Semen, MD  omeprazole (PRILOSEC) 20 MG capsule Take 1 capsule (20 mg total) by mouth daily. Patient not taking: Reported on 05/13/2017 05/24/16   Hower, Cletis [place], MD  ondansetron (ZOFRAN ODT) 4 MG disintegrating tablet Take 1 tablet (4 mg total) by mouth every 8 (eight) hours as needed for nausea or vomiting. 05/14/17   Katha Hamming, MD  ondansetron (ZOFRAN) 4 MG tablet Take 1 tablet (4 mg total) by mouth every 8 (eight) hours as needed. 09/23/20   Phineas Semen, MD  oxyCODONE-acetaminophen (PERCOCET) 7.5-325 MG tablet Take 1 tablet by mouth every 6 (six) hours as needed. 10/17/20   Joni Reining, PA-C  sulfamethoxazole-trimethoprim (BACTRIM DS) 800-160 MG tablet Take 1 tablet by mouth 2 (two) times daily. 07/18/22   Sharman Cheek, MD    Physical Exam: Vitals:   08/09/22 1826  BP: 128/69  Pulse: 93  Resp: 16  Temp: 98.7 F (37.1 C)  SpO2: 100%   Physical Exam Vitals  reviewed.  Constitutional:      Appearance: Normal appearance. He is not toxic-appearing or diaphoretic.  HENT:     Head: Normocephalic and atraumatic.     Mouth/Throat:     Mouth: Mucous membranes are moist.  Eyes:     General: No scleral icterus. Cardiovascular:     Rate and Rhythm: Normal rate and regular rhythm.     Heart sounds: Normal heart sounds.  Pulmonary:     Effort: Pulmonary effort is normal.     Breath sounds: Normal breath sounds.  Abdominal:     General: Abdomen is flat. Bowel sounds are normal.      Palpations: Abdomen is soft.  Genitourinary:    Comments: Marked scrotal edema extending somewhat into the shaft of the penis at the base.  Several small spontaneously draining abscesses seen in right inguinal fold Skin:    General: Skin is warm and dry.  Neurological:     Mental Status: He is alert.  Psychiatric:        Mood and Affect: Mood normal.        Behavior: Behavior normal.     Data Reviewed: Results for orders placed or performed during the hospital encounter of 08/09/22 (from the past 24 hour(s))  Urinalysis, Routine w reflex microscopic Urine, Clean Catch     Status: Abnormal   Collection Time: 08/09/22  6:29 PM  Result Value Ref Range   Color, Urine YELLOW (A) YELLOW   APPearance CLEAR (A) CLEAR   Specific Gravity, Urine 1.030 1.005 - 1.030   pH 7.0 5.0 - 8.0   Glucose, UA NEGATIVE NEGATIVE mg/dL   Hgb urine dipstick NEGATIVE NEGATIVE   Bilirubin Urine NEGATIVE NEGATIVE   Ketones, ur NEGATIVE NEGATIVE mg/dL   Protein, ur 30 (A) NEGATIVE mg/dL   Nitrite NEGATIVE NEGATIVE   Leukocytes,Ua SMALL (A) NEGATIVE   RBC / HPF 0-5 0 - 5 RBC/hpf   WBC, UA 11-20 0 - 5 WBC/hpf   Bacteria, UA NONE SEEN NONE SEEN   Squamous Epithelial / HPF 0-5 0 - 5 /HPF   Mucus PRESENT   Chlamydia/NGC rt PCR ([facility] only)     Status: None   Collection Time: 08/09/22  6:29 PM   Specimen: Urine  Result Value Ref Range   Specimen source GC/Chlam URINE, RANDOM    Chlamydia Tr NOT DETECTED NOT DETECTED   N gonorrhoeae NOT DETECTED NOT DETECTED  CBC     Status: Abnormal   Collection Time: 08/09/22  7:21 PM  Result Value Ref Range   WBC 14.2 (H) 4.0 - 10.5 K/uL   RBC 3.95 (L) 4.22 - 5.81 MIL/uL   Hemoglobin 12.3 (L) 13.0 - 17.0 g/dL   HCT 37.3 (L) 39.0 - 52.0 %   MCV 94.4 80.0 - 100.0 fL   MCH 31.1 26.0 - 34.0 pg   MCHC 33.0 30.0 - 36.0 g/dL   RDW 12.1 11.5 - 15.5 %   Platelets 322 150 - 400 K/uL   nRBC 0.0 0.0 - 0.2 %  Comprehensive metabolic panel     Status: Abnormal   Collection  Time: 08/09/22  7:21 PM  Result Value Ref Range   Sodium 135 135 - 145 mmol/L   Potassium 3.1 (L) 3.5 - 5.1 mmol/L   Chloride 100 98 - 111 mmol/L   CO2 28 22 - 32 mmol/L   Glucose, Bld 96 70 - 99 mg/dL   BUN 14 6 - 20 mg/dL   Creatinine, Ser 0.91 0.61 -  1.24 mg/dL   Calcium 8.1 (L) 8.9 - 10.3 mg/dL   Total Protein 7.5 6.5 - 8.1 g/dL   Albumin 3.6 3.5 - 5.0 g/dL   AST 28 15 - 41 U/L   ALT 16 0 - 44 U/L   Alkaline Phosphatase 71 38 - 126 U/L   Total Bilirubin 0.7 0.3 - 1.2 mg/dL   GFR, Estimated >60 >60 mL/min   Anion gap 7 5 - 15   CT PELVIS W CONTRAST CLINICAL DATA:  Scrotal swelling with right-sided boil, initial encounter  EXAM: CT PELVIS WITH CONTRAST  TECHNIQUE: Multidetector CT imaging of the pelvis was performed using the standard protocol following the bolus administration of intravenous contrast.  RADIATION DOSE REDUCTION: This exam was performed according to the departmental dose-optimization program which includes automated exposure control, adjustment of the mA and/or kV according to patient size and/or use of iterative reconstruction technique.  CONTRAST:  116mL OMNIPAQUE IOHEXOL 300 MG/ML  SOLN  COMPARISON:  Ultrasound of the scrotum from earlier in the same day.  FINDINGS: Urinary Tract: Bladder is partially distended. No calculi are noted.  Bowel: No obstructive or inflammatory changes of the colon or small bowel are seen. The appendix is within normal limits.  Vascular/Lymphatic: No vascular abnormality is noted. Multiple inguinal lymph nodes are noted the largest of these lie on the right measuring up to 2 cm in short axis. These are likely reactive given the localized inflammatory change of the scrotum.  Reproductive: Prostate is within normal limits. Diffuse scrotal swelling is identified similar to that seen on recent ultrasound testicles are well visualized and appear within normal limits. The focal fluid appearing structure on recent  ultrasound in the right scrotum is not as well appreciated due to the significant fluid attenuation related to the scrotal edema. There is some mild subcutaneous edema identified in the medial right buttock inferiorly which may represent some localized extension of edema from the perineum.  Other:  No free fluid is noted within the pelvis.  Musculoskeletal: No acute bony abnormality is noted.  IMPRESSION: Diffuse scrotal swelling consistent with the given clinical history. The small discrete fluid areas seen on recent ultrasound is not as well appreciated on this exam due to the significant fluid attenuation from the scrotal swelling.  Electronically Signed   By: Inez Catalina M.D.   On: 08/09/2022 20:38 US SCROTUM W/DOPPLER CLINICAL DATA:  Scrotum pain  EXAM: SCROTAL ULTRASOUND  DOPPLER ULTRASOUND OF THE TESTICLES  TECHNIQUE: Complete ultrasound examination of the testicles, epididymis, and other scrotal structures was performed. Color and spectral Doppler ultrasound were also utilized to evaluate blood flow to the testicles.  COMPARISON:  Ultrasound 05/08/2016  FINDINGS: Right testicle  Measurements: 4.8 x 2.4 x 3.2 cm. No mass or microlithiasis visualized.  Left testicle  Measurements: 4.5 x 2.1 x 3.4 cm. No mass or microlithiasis visualized.  Right epididymis:  Normal in size and appearance.  Left epididymis:  Normal in size and appearance.  Hydrocele:  None visualized.  Varicocele:  None visualized.  Pulsed Doppler interrogation of both testes demonstrates normal low resistance arterial and venous waveforms bilaterally.  Marked scrotal skin thickening, edema and hyperemia. Irregular hypoechoic fluid collection measuring 1.9 x 0.6 x 3.4 cm within the thickened scrotal wall posteriorly in the region of reported boil  IMPRESSION: 1. Negative for torsion 2. Marked scrotal wall thickening and hyperemia suspicious for cellulitis. 3.4 cm irregular fluid  collection at the thickened posterior scrotal wall in the region of reported boil  suspicious for an abscess.  Electronically Signed   By: Jasmine Pang M.D.   On: 08/09/2022 18:32   Assessment and Plan: Coltan Spinello is a 27 y.o. male with medical history significant for recurrent scrotal abscesses who presents with concern for scrotal abscess.  * Scrotal abscess Large scrotal abscess seen on ultrasound, urology has been consulted and plan to evaluate in the a.m. and possibly take 2 OR for I&D.  Based on history and exam strong suspicion for hidradenitis suppurativa, I discussed this with patient at length and strongly recommended he seek care with a dermatologist to see what kind of preventative treatments he can engage with. - Continue clindamycin - N.p.o. at midnight with LR written at 125 cc an hour - Pain meds as needed - Follow-up with urology in a.m.      Advance Care Planning:   Code Status: Full Code   Consults: Urology to see in AM  Family Communication: None  Severity of Illness: The appropriate patient status for this patient is INPATIENT. Inpatient status is judged to be reasonable and necessary in order to provide the required intensity of service to ensure the patient's safety. The patient's presenting symptoms, physical exam findings, and initial radiographic and laboratory data in the context of their chronic comorbidities is felt to place them at high risk for further clinical deterioration. Furthermore, it is not anticipated that the patient will be medically stable for discharge from the hospital within 2 midnights of admission.   * I certify that at the point of admission it is my clinical judgment that the patient will require inpatient hospital care spanning beyond 2 midnights from the point of admission due to high intensity of service, high risk for further deterioration and high frequency of surveillance required.*  Author: Venora Maples,  MD 08/09/2022 10:35 PM  For on call review [email or web address].

## 2022-08-10 DIAGNOSIS — L732 Hidradenitis suppurativa: Secondary | ICD-10-CM

## 2022-08-10 DIAGNOSIS — N5089 Other specified disorders of the male genital organs: Secondary | ICD-10-CM | POA: Diagnosis not present

## 2022-08-10 DIAGNOSIS — N492 Inflammatory disorders of scrotum: Secondary | ICD-10-CM | POA: Diagnosis not present

## 2022-08-10 LAB — BASIC METABOLIC PANEL
Anion gap: 7 (ref 5–15)
BUN: 10 mg/dL (ref 6–20)
CO2: 25 mmol/L (ref 22–32)
Calcium: 7.8 mg/dL — ABNORMAL LOW (ref 8.9–10.3)
Chloride: 105 mmol/L (ref 98–111)
Creatinine, Ser: 0.83 mg/dL (ref 0.61–1.24)
GFR, Estimated: >60 mL/min (ref >60)
Glucose, Bld: 105 mg/dL — ABNORMAL HIGH (ref 70–99)
Potassium: 3.5 mmol/L (ref 3.5–5.1)
Sodium: 137 mmol/L (ref 135–145)

## 2022-08-10 LAB — CBC
HCT: 36.3 % — ABNORMAL LOW (ref 39.0–52.0)
Hemoglobin: 12 g/dL — ABNORMAL LOW (ref 13.0–17.0)
MCH: 31 pg (ref 26.0–34.0)
MCHC: 33.1 g/dL (ref 30.0–36.0)
MCV: 93.8 fL (ref 80.0–100.0)
Platelets: 298 10*3/uL (ref 150–400)
RBC: 3.87 MIL/uL — ABNORMAL LOW (ref 4.22–5.81)
RDW: 12.1 % (ref 11.5–15.5)
WBC: 10.5 10*3/uL (ref 4.0–10.5)
nRBC: 0 % (ref 0.0–0.2)

## 2022-08-10 MED ORDER — CLINDAMYCIN PHOSPHATE 600 MG/50ML IV SOLN
600.0000 mg | Freq: Three times a day (TID) | INTRAVENOUS | Status: DC
  Administered 2022-08-10 – 2022-08-11 (×4): 600 mg via INTRAVENOUS
  Filled 2022-08-10 (×5): qty 50

## 2022-08-10 NOTE — ED Notes (Signed)
Request made for transport to the floor ?

## 2022-08-10 NOTE — ED Provider Notes (Signed)
  Physical Exam  BP 128/69   Pulse 93   Temp 98.3 F (36.8 C) (Oral)   Resp 16   SpO2 100%   Physical Exam  Procedures  Procedures  ED Course / MDM    Medical Decision Making Amount and/or Complexity of Data Reviewed Labs: ordered. Radiology: ordered.  Risk Prescription drug management. Decision regarding hospitalization.   Assumed patient care from my attending, Dr. Harvest Dark.  CT pelvis shows no signs of free air or gas.  I reviewed ultrasound and CT findings with urologist on-call, Dr. Erlene Quan who does not feel that patient needs a surgical intervention tonight.  Patient was admitted to the hospitalist service under the care of Dr. Clayton Lefort.        Vallarie Mare Weatherby, PA-C 08/10/22 0001    Harvest Dark, MD 08/10/22 2328

## 2022-08-10 NOTE — Hospital Course (Signed)
27 y.o. male with medical history significant for recurrent scrotal abscesses who presents with concern for scrotal abscess.   1/13: Urology consult

## 2022-08-10 NOTE — ED Notes (Signed)
Attempted to call for verbal report. RN states that she will read the IP SBAR shortly

## 2022-08-10 NOTE — Consult Note (Signed)
Urology Consult  I have been asked to see the patient by Dr. Manuella Ghazi, for evaluation and management of scrotal abscess.  Chief Complaint: Scrotal pain and swelling  History of Present Illness: Jose Mays is a 27 y.o. year old male with a personal history of recurrent scrotal abscess who presented to the emergency room last night with worsening 3 days of penile scrotal swelling and pain.  At bedside, there is no appreciable obvious drainable collection.  He had a scrotal ultrasound which showed a possible 6 mm elongated area about 3.4 cm long representing possible abscess but normal testicles bilaterally.  He also had a pelvic CT showing marked penoscrotal edema but no discernible collections and no free air concerning for Fournier's gangrene.  He was admitted to the hospitalist service, started on IV clindamycin.  He had improvement in his leukocytosis from 14 to 10.  He has remained afebrile.  He notes that he started to have drainage from the right side earlier today.  Drainage is purulent and bloody.  Penoscrotal edema stable per his report.  Unfortunately, he just ate a large lunch.  Notably, he has a personal history of this.  He is undergone multiple I&D's by urology back in 2017.  The time it was felt that he likely had a component of hidradenitis.  He had sinus tracts that were complex at that time.  He has never seen dermatology for this.  This is a chronic issue for him.   Past Medical History:  Diagnosis Date   Abscess     Past Surgical History:  Procedure Laterality Date   TONSILLECTOMY      Home Medications:  No outpatient medications have been marked as taking for the 08/09/22 encounter [facility] Encounter).    Allergies: No Known Allergies  Family History  Problem Relation Age of Onset   Prostate cancer Paternal Uncle    Rheum arthritis Paternal Grandmother    Bladder Cancer Neg Hx     Social History:  reports that he has been smoking. He has never  used smokeless tobacco. He reports that he does not drink alcohol and does not use drugs.  ROS: A complete review of systems was performed.  All systems are negative except for pertinent findings as noted.  Physical Exam:  Vital signs in last 24 hours: Temp:  [98.1 F (36.7 C)-98.7 F (37.1 C)] 98.1 F (36.7 C) (01/13 0816) Pulse Rate:  [60-93] 60 (01/13 0816) Resp:  [16-18] 18 (01/13 0816) BP: (104-128)/(49-69) 104/56 (01/13 0816) SpO2:  [97 %-100 %] 97 % (01/13 0816) Weight:  [94.8 kg] 94.8 kg (01/13 0321) Constitutional:  Alert and oriented, No acute distress HEENT: [place] AT, moist mucus membranes.  Trachea midline, no masses Cardiovascular: Regular rate and rhythm, no clubbing, cyanosis, or edema. Respiratory: Normal respiratory effort, lungs clear bilaterally GI: Abdomen is soft, nontender, nondistended, no abdominal masses GU: Circumcised phallus with moderate penile edema.  Marked diffuse scrotal edema and skin thickening without erythema.  There are several areas of granulation tissue and scarring in both of the inguinal areas, right greater than left.  On the right side, more laterally and superiorly, there is a open granulomatous area measuring about 5 mm weeping scant amount of bloody purulent material.  There is another area of granulation adjacent to this along with similar areas on the left side.  The perineum is intact.  I was able to express additional purulent material out of the right superior lateral area, approximately  5 cc of fluid and continue to drain. Lymph: No cervical or inguinal adenopathy Neurologic: Grossly intact, no focal deficits, moving all 4 extremities Psychiatric: Normal mood and affect   Laboratory Data:  Recent Labs    08/09/22 1921 08/10/22 0620  WBC 14.2* 10.5  HGB 12.3* 12.0*  HCT 37.3* 36.3*   Recent Labs    08/09/22 1921 08/10/22 0620  NA 135 137  K 3.1* 3.5  CL 100 105  CO2 28 25  GLUCOSE 96 105*  BUN 14 10  CREATININE 0.91 0.83   CALCIUM 8.1* 7.8*   No results for input(s): "LABPT", "INR" in the last 72 hours. No results for input(s): "LABURIN" in the last 72 hours. Results for orders placed or performed during the hospital encounter of 08/09/22  [place] rt PCR [facility] only)     Status: None   Collection Time: 08/09/22  6:29 PM   Specimen: Urine  Result Value Ref Range Status   Specimen source GC/Chlam URINE, RANDOM  Final   Chlamydia Tr NOT DETECTED NOT DETECTED Final   N gonorrhoeae NOT DETECTED NOT DETECTED Final    Comment: (NOTE) This CT/NG assay has not been evaluated in patients with a history of  hysterectomy. Performed at [facility], [address] [place], [place] [id number]      Radiologic Imaging: CT PELVIS W CONTRAST  Result Date: 08/09/2022 CLINICAL DATA:  Scrotal swelling with right-sided boil, initial encounter EXAM: CT PELVIS WITH CONTRAST TECHNIQUE: Multidetector CT imaging of the pelvis was performed using the standard protocol following the bolus administration of intravenous contrast. RADIATION DOSE REDUCTION: This exam was performed according to the departmental dose-optimization program which includes automated exposure control, adjustment of the mA and/or kV according to patient size and/or use of iterative reconstruction technique. CONTRAST:  1103mL OMNIPAQUE IOHEXOL 300 MG/ML  SOLN COMPARISON:  Ultrasound of the scrotum from earlier in the same day. FINDINGS: Urinary Tract: Bladder is partially distended. No calculi are noted. Bowel: No obstructive or inflammatory changes of the colon or small bowel are seen. The appendix is within normal limits. Vascular/Lymphatic: No vascular abnormality is noted. Multiple inguinal lymph nodes are noted the largest of these lie on the right measuring up to 2 cm in short axis. These are likely reactive given the localized inflammatory change of the scrotum. Reproductive: Prostate is within normal limits. Diffuse scrotal swelling is  identified similar to that seen on recent ultrasound testicles are well visualized and appear within normal limits. The focal fluid appearing structure on recent ultrasound in the right scrotum is not as well appreciated due to the significant fluid attenuation related to the scrotal edema. There is some mild subcutaneous edema identified in the medial right buttock inferiorly which may represent some localized extension of edema from the perineum. Other:  No free fluid is noted within the pelvis. Musculoskeletal: No acute bony abnormality is noted. IMPRESSION: Diffuse scrotal swelling consistent with the given clinical history. The small discrete fluid areas seen on recent ultrasound is not as well appreciated on this exam due to the significant fluid attenuation from the scrotal swelling. Electronically Signed   By: Inez Catalina M.D.   On: 08/09/2022 20:38   US SCROTUM W/DOPPLER  Result Date: 08/09/2022 CLINICAL DATA:  Scrotum pain EXAM: SCROTAL ULTRASOUND DOPPLER ULTRASOUND OF THE TESTICLES TECHNIQUE: Complete ultrasound examination of the testicles, epididymis, and other scrotal structures was performed. Color and spectral Doppler ultrasound were also utilized to evaluate blood flow to the testicles. COMPARISON:  Ultrasound 05/08/2016 FINDINGS: Right testicle Measurements: 4.8 x 2.4 x 3.2 cm. No mass or microlithiasis visualized. Left testicle Measurements: 4.5 x 2.1 x 3.4 cm. No mass or microlithiasis visualized. Right epididymis:  Normal in size and appearance. Left epididymis:  Normal in size and appearance. Hydrocele:  None visualized. Varicocele:  None visualized. Pulsed Doppler interrogation of both testes demonstrates normal low resistance arterial and venous waveforms bilaterally. Marked scrotal skin thickening, edema and hyperemia. Irregular hypoechoic fluid collection measuring 1.9 x 0.6 x 3.4 cm within the thickened scrotal wall posteriorly in the region of reported boil IMPRESSION: 1. Negative  for torsion 2. Marked scrotal wall thickening and hyperemia suspicious for cellulitis. 3.4 cm irregular fluid collection at the thickened posterior scrotal wall in the region of reported boil suspicious for an abscess. Electronically Signed   By: Kim  Fujinaga M.D.   On: 08/09/2022 18:32    I personally reviewed all of the above images.  Impression/ Plan:  1.  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 Suspect he has multiple interconnecting sinus tracts but there is no single discrete area that is fluctuant or contains a large nondraining abscess cavity on exam today.  At this point, there is no clear drainable collection easily at bedside and would favor exploration in the OR if any intervention is undertaken however the patient unfortunately elected to eat lunch and is not currently n.p.o.  -Will plan on reassessing again first thing in the morning with consideration of exploration in the OR tomorrow if his exam worsens or single area becomes more distinct/fluctuant.  If he continues to drain spontaneously and improves on antibiotics, he may not require further exploration in the OR.  -Agree with IV clindamycin  -Agree with outpatient dermatology referral  -Plan was discussed with Dr. Shah   08/10/2022, 1:23 PM  Eulanda Dorion,  MD       

## 2022-08-10 NOTE — Plan of Care (Signed)
  Problem: Education: Goal: Knowledge of General Education information will improve Description: Including pain rating scale, medication(s)/side effects and non-pharmacologic comfort measures Outcome: Completed/Met   Problem: Activity: Goal: Risk for activity intolerance will decrease Outcome: Completed/Met   Problem: Nutrition: Goal: Adequate nutrition will be maintained Outcome: Progressing   Problem: Coping: Goal: Level of anxiety will decrease Outcome: Progressing   Problem: Elimination: Goal: Will not experience complications related to bowel motility Outcome: Completed/Met   Problem: Pain Managment: Goal: General experience of comfort will improve Outcome: Progressing   Problem: Safety: Goal: Ability to remain free from injury will improve Outcome: Completed/Met   Problem: Skin Integrity: Goal: Risk for impaired skin integrity will decrease Outcome: Progressing

## 2022-08-10 NOTE — Progress Notes (Signed)
  Progress Note   Patient: Jose Mays QAS:[id number] DOB: Aug 11, 1995 DOA: 08/09/2022     1 DOS: the patient was seen and examined on 08/10/2022   Brief hospital course: 27 y.o. male with medical history significant for recurrent scrotal abscesses who presents with concern for scrotal abscess.   1/13: Urology consult  Assessment and Plan: * Scrotal abscess scrotal abscess seen on ultrasound, urology has been consulted and plan to evaluate today to decide need for I&D.  Based on history and exam strong suspicion for hidradenitis suppurativa, he is strongly recommended to seek care with a dermatologist as an outpatient to see what kind of preventative treatments he can engage with. - Continue clindamycin - N.p.o. for now until decision for I&D by urology - Pain meds as needed         Subjective: No new complaints.  Hoping to see urology soon  Physical Exam: Vitals:   08/09/22 2351 08/10/22 0130 08/10/22 0321 08/10/22 0816  BP:  (!) 108/57 (!) 107/49 (!) 104/56  Pulse:  79 74 60  Resp:  18 18 18   Temp: 98.3 F (36.8 C) 98.2 F (36.8 C) 98.3 F (36.8 C) 98.1 F (36.7 C)  TempSrc: Oral  Oral   SpO2:  98% 99% 97%  Weight:   94.8 kg   Height:   5\' 6"  (1.57 m)    27 year old male lying in the bed comfortably without any acute distress Lungs clear to auscultation bilaterally Cardiovascular regular rate and rhythm Abdomen soft, benign Neuro alert and awake, nonfocal GU: Scrotal edema extending to the shaft of the penis base.  Several small spontaneously draining abscesses on both side of scrotum and in the inguinal folds  Data Reviewed:  There are no new results to review at this time.  Family Communication: None  Disposition: Status is: Inpatient Remains inpatient appropriate because: Waiting for urology evaluation to decide need for I&D for scrotal abscess  Planned Discharge Destination: Home   DVT prophylaxis-SCDs Time spent: 35 minutes  Author: Max Sane,  MD 08/10/2022 11:44 AM  For on call review [email or web address].

## 2022-08-10 NOTE — Plan of Care (Signed)
  Problem: Nutrition: Goal: Adequate nutrition will be maintained Outcome: Progressing   Problem: Coping: Goal: Level of anxiety will decrease Outcome: Progressing   Problem: Skin Integrity: Goal: Risk for impaired skin integrity will decrease Outcome: Progressing   Problem: Pain Managment: Goal: General experience of comfort will improve Outcome: Progressing   Problem: Clinical Measurements: Goal: Ability to maintain clinical measurements within normal limits will improve Outcome: Progressing   Problem: Clinical Measurements: Goal: Diagnostic test results will improve Outcome: Progressing

## 2022-08-10 NOTE — H&P (View-Only) (Signed)
Urology Consult  I have been asked to see the patient by Dr. Manuella Ghazi, for evaluation and management of scrotal abscess.  Chief Complaint: Scrotal pain and swelling  History of Present Illness: Jose Mays is a 27 y.o. year old male with a personal history of recurrent scrotal abscess who presented to the emergency room last night with worsening 3 days of penile scrotal swelling and pain.  At bedside, there is no appreciable obvious drainable collection.  He had a scrotal ultrasound which showed a possible 6 mm elongated area about 3.4 cm long representing possible abscess but normal testicles bilaterally.  He also had a pelvic CT showing marked penoscrotal edema but no discernible collections and no free air concerning for Fournier's gangrene.  He was admitted to the hospitalist service, started on IV clindamycin.  He had improvement in his leukocytosis from 14 to 10.  He has remained afebrile.  He notes that he started to have drainage from the right side earlier today.  Drainage is purulent and bloody.  Penoscrotal edema stable per his report.  Unfortunately, he just ate a large lunch.  Notably, he has a personal history of this.  He is undergone multiple I&D's by urology back in 2017.  The time it was felt that he likely had a component of hidradenitis.  He had sinus tracts that were complex at that time.  He has never seen dermatology for this.  This is a chronic issue for him.   Past Medical History:  Diagnosis Date   Abscess     Past Surgical History:  Procedure Laterality Date   TONSILLECTOMY      Home Medications:  No outpatient medications have been marked as taking for the 08/09/22 encounter [facility] Encounter).    Allergies: No Known Allergies  Family History  Problem Relation Age of Onset   Prostate cancer Paternal Uncle    Rheum arthritis Paternal Grandmother    Bladder Cancer Neg Hx     Social History:  reports that he has been smoking. He has never  used smokeless tobacco. He reports that he does not drink alcohol and does not use drugs.  ROS: A complete review of systems was performed.  All systems are negative except for pertinent findings as noted.  Physical Exam:  Vital signs in last 24 hours: Temp:  [98.1 F (36.7 C)-98.7 F (37.1 C)] 98.1 F (36.7 C) (01/13 0816) Pulse Rate:  [60-93] 60 (01/13 0816) Resp:  [16-18] 18 (01/13 0816) BP: (104-128)/(49-69) 104/56 (01/13 0816) SpO2:  [97 %-100 %] 97 % (01/13 0816) Weight:  [94.8 kg] 94.8 kg (01/13 0321) Constitutional:  Alert and oriented, No acute distress HEENT: [place] AT, moist mucus membranes.  Trachea midline, no masses Cardiovascular: Regular rate and rhythm, no clubbing, cyanosis, or edema. Respiratory: Normal respiratory effort, lungs clear bilaterally GI: Abdomen is soft, nontender, nondistended, no abdominal masses GU: Circumcised phallus with moderate penile edema.  Marked diffuse scrotal edema and skin thickening without erythema.  There are several areas of granulation tissue and scarring in both of the inguinal areas, right greater than left.  On the right side, more laterally and superiorly, there is a open granulomatous area measuring about 5 mm weeping scant amount of bloody purulent material.  There is another area of granulation adjacent to this along with similar areas on the left side.  The perineum is intact.  I was able to express additional purulent material out of the right superior lateral area, approximately  5 cc of fluid and continue to drain. Lymph: No cervical or inguinal adenopathy Neurologic: Grossly intact, no focal deficits, moving all 4 extremities Psychiatric: Normal mood and affect   Laboratory Data:  Recent Labs    08/09/22 1921 08/10/22 0620  WBC 14.2* 10.5  HGB 12.3* 12.0*  HCT 37.3* 36.3*   Recent Labs    08/09/22 1921 08/10/22 0620  NA 135 137  K 3.1* 3.5  CL 100 105  CO2 28 25  GLUCOSE 96 105*  BUN 14 10  CREATININE 0.91 0.83   CALCIUM 8.1* 7.8*   No results for input(s): "LABPT", "INR" in the last 72 hours. No results for input(s): "LABURIN" in the last 72 hours. Results for orders placed or performed during the hospital encounter of 08/09/22  [place] rt [facility] only)     Status: None   Collection Time: 08/09/22  6:29 PM   Specimen: Urine  Result Value Ref Range Status   Specimen source GC/Chlam URINE, RANDOM  Final   Chlamydia Tr NOT DETECTED NOT DETECTED Final   N gonorrhoeae NOT DETECTED NOT DETECTED Final    Comment: (NOTE) This CT/NG assay has not been evaluated in patients with a history of  hysterectomy. Performed at [facility], [address] [place], [place] [address]      Radiologic Imaging: CT PELVIS W CONTRAST  Result Date: 08/09/2022 CLINICAL DATA:  Scrotal swelling with right-sided boil, initial encounter EXAM: CT PELVIS WITH CONTRAST TECHNIQUE: Multidetector CT imaging of the pelvis was performed using the standard protocol following the bolus administration of intravenous contrast. RADIATION DOSE REDUCTION: This exam was performed according to the departmental dose-optimization program which includes automated exposure control, adjustment of the mA and/or kV according to patient size and/or use of iterative reconstruction technique. CONTRAST:  1103mL OMNIPAQUE IOHEXOL 300 MG/ML  SOLN COMPARISON:  Ultrasound of the scrotum from earlier in the same day. FINDINGS: Urinary Tract: Bladder is partially distended. No calculi are noted. Bowel: No obstructive or inflammatory changes of the colon or small bowel are seen. The appendix is within normal limits. Vascular/Lymphatic: No vascular abnormality is noted. Multiple inguinal lymph nodes are noted the largest of these lie on the right measuring up to 2 cm in short axis. These are likely reactive given the localized inflammatory change of the scrotum. Reproductive: Prostate is within normal limits. Diffuse scrotal swelling is  identified similar to that seen on recent ultrasound testicles are well visualized and appear within normal limits. The focal fluid appearing structure on recent ultrasound in the right scrotum is not as well appreciated due to the significant fluid attenuation related to the scrotal edema. There is some mild subcutaneous edema identified in the medial right buttock inferiorly which may represent some localized extension of edema from the perineum. Other:  No free fluid is noted within the pelvis. Musculoskeletal: No acute bony abnormality is noted. IMPRESSION: Diffuse scrotal swelling consistent with the given clinical history. The small discrete fluid areas seen on recent ultrasound is not as well appreciated on this exam due to the significant fluid attenuation from the scrotal swelling. Electronically Signed   By: Inez Catalina M.D.   On: 08/09/2022 20:38   US SCROTUM W/DOPPLER  Result Date: 08/09/2022 CLINICAL DATA:  Scrotum pain EXAM: SCROTAL ULTRASOUND DOPPLER ULTRASOUND OF THE TESTICLES TECHNIQUE: Complete ultrasound examination of the testicles, epididymis, and other scrotal structures was performed. Color and spectral Doppler ultrasound were also utilized to evaluate blood flow to the testicles. COMPARISON:  Ultrasound 05/08/2016 FINDINGS: Right testicle Measurements: 4.8 x 2.4 x 3.2 cm. No mass or microlithiasis visualized. Left testicle Measurements: 4.5 x 2.1 x 3.4 cm. No mass or microlithiasis visualized. Right epididymis:  Normal in size and appearance. Left epididymis:  Normal in size and appearance. Hydrocele:  None visualized. Varicocele:  None visualized. Pulsed Doppler interrogation of both testes demonstrates normal low resistance arterial and venous waveforms bilaterally. Marked scrotal skin thickening, edema and hyperemia. Irregular hypoechoic fluid collection measuring 1.9 x 0.6 x 3.4 cm within the thickened scrotal wall posteriorly in the region of reported boil IMPRESSION: 1. Negative  for torsion 2. Marked scrotal wall thickening and hyperemia suspicious for cellulitis. 3.4 cm irregular fluid collection at the thickened posterior scrotal wall in the region of reported boil suspicious for an abscess. Electronically Signed   By: Jasmine Pang M.D.   On: 08/09/2022 18:32    I personally reviewed all of the above images.  Impression/ Plan:  1.  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 Suspect he has multiple interconnecting sinus tracts but there is no single discrete area that is fluctuant or contains a large nondraining abscess cavity on exam today.  At this point, there is no clear drainable collection easily at bedside and would favor exploration in the OR if any intervention is undertaken however the patient unfortunately elected to eat lunch and is not currently n.p.o.  -Will plan on reassessing again first thing in the morning with consideration of exploration in the OR tomorrow if his exam worsens or single area becomes more distinct/fluctuant.  If he continues to drain spontaneously and improves on antibiotics, he may not require further exploration in the OR.  -Agree with IV clindamycin  -Agree with outpatient dermatology referral  -Plan was discussed with Dr. Sherryll Burger   08/10/2022, 1:23 PM  Vanna Scotland,  MD

## 2022-08-10 NOTE — ED Notes (Signed)
Called for transport. Pt stable. To be transported to room via wheelchair

## 2022-08-11 ENCOUNTER — Inpatient Hospital Stay: Payer: Medicaid Other | Admitting: Certified Registered"

## 2022-08-11 ENCOUNTER — Other Ambulatory Visit: Payer: Self-pay

## 2022-08-11 ENCOUNTER — Encounter
Admission: EM | Disposition: A | Discharge status: Home / Self Care | Payer: Self-pay | Source: Home / Self Care | Attending: Internal Medicine

## 2022-08-11 DIAGNOSIS — L732 Hidradenitis suppurativa: Secondary | ICD-10-CM | POA: Diagnosis not present

## 2022-08-11 DIAGNOSIS — N5089 Other specified disorders of the male genital organs: Secondary | ICD-10-CM | POA: Diagnosis not present

## 2022-08-11 DIAGNOSIS — N492 Inflammatory disorders of scrotum: Secondary | ICD-10-CM | POA: Diagnosis not present

## 2022-08-11 HISTORY — PX: INCISION AND DRAINAGE ABSCESS: SHX5864

## 2022-08-11 LAB — HIV ANTIBODY (ROUTINE TESTING W REFLEX): HIV Screen 4th Generation wRfx: NONREACTIVE

## 2022-08-11 LAB — CBC
HCT: 36.3 % — ABNORMAL LOW (ref 39.0–52.0)
Hemoglobin: 12 g/dL — ABNORMAL LOW (ref 13.0–17.0)
MCH: 31 pg (ref 26.0–34.0)
MCHC: 33.1 g/dL (ref 30.0–36.0)
MCV: 93.8 fL (ref 80.0–100.0)
Platelets: 313 10*3/uL (ref 150–400)
RBC: 3.87 MIL/uL — ABNORMAL LOW (ref 4.22–5.81)
RDW: 11.8 % (ref 11.5–15.5)
WBC: 7.8 10*3/uL (ref 4.0–10.5)
nRBC: 0 % (ref 0.0–0.2)

## 2022-08-11 SURGERY — INCISION AND DRAINAGE, ABSCESS
Anesthesia: General

## 2022-08-11 MED ORDER — MIDAZOLAM HCL 2 MG/2ML IJ SOLN
INTRAMUSCULAR | Status: DC | PRN
  Administered 2022-08-11: 2 mg via INTRAVENOUS

## 2022-08-11 MED ORDER — LIDOCAINE HCL 1 % IJ SOLN
INTRAMUSCULAR | Status: DC | PRN
  Administered 2022-08-11: 10 mL

## 2022-08-11 MED ORDER — ACETAMINOPHEN 10 MG/ML IV SOLN
INTRAVENOUS | Status: AC
  Filled 2022-08-11: qty 100

## 2022-08-11 MED ORDER — DEXAMETHASONE SODIUM PHOSPHATE 10 MG/ML IJ SOLN
INTRAMUSCULAR | Status: DC | PRN
  Administered 2022-08-11: 10 mg via INTRAVENOUS

## 2022-08-11 MED ORDER — PROPOFOL 10 MG/ML IV BOLUS
INTRAVENOUS | Status: AC
  Filled 2022-08-11: qty 20

## 2022-08-11 MED ORDER — PROPOFOL 10 MG/ML IV BOLUS
INTRAVENOUS | Status: DC | PRN
  Administered 2022-08-11: 200 mg via INTRAVENOUS

## 2022-08-11 MED ORDER — IBUPROFEN 400 MG PO TABS: 400.0000 mg | ORAL_TABLET | Freq: Four times a day (QID) | ORAL | 0 refills | Status: DC | PRN

## 2022-08-11 MED ORDER — FENTANYL CITRATE (PF) 100 MCG/2ML IJ SOLN
INTRAMUSCULAR | Status: DC | PRN
  Administered 2022-08-11 (×3): 50 ug via INTRAVENOUS

## 2022-08-11 MED ORDER — LIDOCAINE HCL (PF) 2 % IJ SOLN
INTRAMUSCULAR | Status: AC
  Filled 2022-08-11: qty 5

## 2022-08-11 MED ORDER — GLYCOPYRROLATE 0.2 MG/ML IJ SOLN
INTRAMUSCULAR | Status: DC | PRN
  Administered 2022-08-11: .2 mg via INTRAVENOUS

## 2022-08-11 MED ORDER — FENTANYL CITRATE (PF) 100 MCG/2ML IJ SOLN
INTRAMUSCULAR | Status: AC
  Filled 2022-08-11: qty 2

## 2022-08-11 MED ORDER — ONDANSETRON HCL 4 MG/2ML IJ SOLN
INTRAMUSCULAR | Status: DC | PRN
  Administered 2022-08-11: 4 mg via INTRAVENOUS

## 2022-08-11 MED ORDER — HYDROMORPHONE HCL 1 MG/ML IJ SOLN: 0.2500 mg | INTRAMUSCULAR | Status: DC | PRN

## 2022-08-11 MED ORDER — DEXAMETHASONE SODIUM PHOSPHATE 10 MG/ML IJ SOLN
INTRAMUSCULAR | Status: AC
  Filled 2022-08-11: qty 1

## 2022-08-11 MED ORDER — KETOROLAC TROMETHAMINE 30 MG/ML IJ SOLN
INTRAMUSCULAR | Status: DC | PRN
  Administered 2022-08-11: 30 mg via INTRAVENOUS

## 2022-08-11 MED ORDER — BUPIVACAINE HCL 0.5 % IJ SOLN
INTRAMUSCULAR | Status: DC | PRN
  Administered 2022-08-11: 10 mL

## 2022-08-11 MED ORDER — ACETAMINOPHEN 10 MG/ML IV SOLN
INTRAVENOUS | Status: DC | PRN
  Administered 2022-08-11: 1000 mg via INTRAVENOUS

## 2022-08-11 MED ORDER — KETOROLAC TROMETHAMINE 30 MG/ML IJ SOLN
INTRAMUSCULAR | Status: AC
  Filled 2022-08-11: qty 1

## 2022-08-11 MED ORDER — OXYCODONE HCL 5 MG PO TABS
ORAL_TABLET | ORAL | Status: AC
  Administered 2022-08-11: 5 mg via ORAL
  Filled 2022-08-11: qty 1

## 2022-08-11 MED ORDER — LIDOCAINE HCL (CARDIAC) PF 100 MG/5ML IV SOSY
PREFILLED_SYRINGE | INTRAVENOUS | Status: DC | PRN
  Administered 2022-08-11: 100 mg via INTRAVENOUS

## 2022-08-11 MED ORDER — CLINDAMYCIN HCL 150 MG PO CAPS: 450.0000 mg | ORAL_CAPSULE | Freq: Three times a day (TID) | ORAL | 0 refills | Status: AC

## 2022-08-11 MED ORDER — OXYCODONE HCL 5 MG PO TABS: 5.0000 mg | ORAL_TABLET | Freq: Once | ORAL | Status: AC | PRN

## 2022-08-11 MED ORDER — ONDANSETRON HCL 4 MG/2ML IJ SOLN
INTRAMUSCULAR | Status: AC
  Filled 2022-08-11: qty 2

## 2022-08-11 MED ORDER — DEXMEDETOMIDINE HCL IN NACL 80 MCG/20ML IV SOLN
INTRAVENOUS | Status: DC | PRN
  Administered 2022-08-11: 4 ug via INTRAVENOUS
  Administered 2022-08-11: 8 ug via INTRAVENOUS
  Administered 2022-08-11: 4 ug via INTRAVENOUS

## 2022-08-11 MED ORDER — OXYCODONE HCL 5 MG/5ML PO SOLN: 5.0000 mg | Freq: Once | ORAL | Status: AC | PRN

## 2022-08-11 MED ORDER — MIDAZOLAM HCL 2 MG/2ML IJ SOLN
INTRAMUSCULAR | Status: AC
  Filled 2022-08-11: qty 2

## 2022-08-11 MED ORDER — 0.9 % SODIUM CHLORIDE (POUR BTL) OPTIME
TOPICAL | Status: DC | PRN
  Administered 2022-08-11: 500 mL

## 2022-08-11 SURGICAL SUPPLY — 40 items
BNDG GAUZE DERMACEA FLUFF 4 (GAUZE/BANDAGES/DRESSINGS) IMPLANT
CHLORAPREP W/TINT 26 (MISCELLANEOUS) ×1 IMPLANT
DERMABOND ADVANCED .7 DNX12 (GAUZE/BANDAGES/DRESSINGS) ×1 IMPLANT
DERMABOND ADVANCED .7 DNX6 (GAUZE/BANDAGES/DRESSINGS) IMPLANT
DRAIN PENROSE 12X.25 LTX STRL (MISCELLANEOUS) ×1 IMPLANT
DRAPE LAPAROTOMY 77X122 PED (DRAPES) ×1 IMPLANT
DRSG GAUZE FLUFF 36X18 (GAUZE/BANDAGES/DRESSINGS) ×1 IMPLANT
ELECT REM PT RETURN 9FT ADLT (ELECTROSURGICAL) ×1
ELECTRODE REM PT RTRN 9FT ADLT (ELECTROSURGICAL) ×1 IMPLANT
GAUZE 4X4 16PLY ~~LOC~~+RFID DBL (SPONGE) ×1 IMPLANT
GAUZE PACKING 0.25INX5YD STRL (GAUZE/BANDAGES/DRESSINGS) IMPLANT
GAUZE SPONGE 4X4 12PLY STRL (GAUZE/BANDAGES/DRESSINGS) ×1 IMPLANT
GAUZE STRETCH 2X75IN STRL (MISCELLANEOUS) ×1 IMPLANT
GLOVE BIO SURGEON STRL SZ 6.5 (GLOVE) ×1 IMPLANT
GLOVE BIO SURGEON STRL SZ7 (GLOVE) ×1 IMPLANT
GOWN STRL REUS W/ TWL LRG LVL3 (GOWN DISPOSABLE) ×2 IMPLANT
GOWN STRL REUS W/TWL LRG LVL3 (GOWN DISPOSABLE) ×2
KIT TURNOVER KIT A (KITS) ×1 IMPLANT
LABEL OR SOLS (LABEL) ×1 IMPLANT
MANIFOLD NEPTUNE II (INSTRUMENTS) ×1 IMPLANT
NDL HYPO 25X1 1.5 SAFETY (NEEDLE) ×1 IMPLANT
NEEDLE HYPO 25X1 1.5 SAFETY (NEEDLE) ×1 IMPLANT
NS IRRIG 500ML POUR BTL (IV SOLUTION) ×1 IMPLANT
PACK BASIN MINOR ARMC (MISCELLANEOUS) ×1 IMPLANT
PACKING GAUZE IODOFORM 1INX5YD (GAUZE/BANDAGES/DRESSINGS) IMPLANT
PAD ABD DERMACEA PRESS 5X9 (GAUZE/BANDAGES/DRESSINGS) IMPLANT
SOL PREP PVP 2OZ (MISCELLANEOUS) ×1
SOLUTION PREP PVP 2OZ (MISCELLANEOUS) ×1 IMPLANT
SUPPORETR ATHLETIC LG (MISCELLANEOUS) ×1 IMPLANT
SUPPORT SCROTAL LRG NO STRP (SOFTGOODS) IMPLANT
SUPPORTER ATHLETIC LG (MISCELLANEOUS) ×1
SUT ETHILON 3-0 FS-10 30 BLK (SUTURE) ×1
SUT VIC AB 3-0 SH 27 (SUTURE) ×2
SUT VIC AB 3-0 SH 27X BRD (SUTURE) ×2 IMPLANT
SUT VIC AB 4-0 SH 27 (SUTURE) ×1
SUT VIC AB 4-0 SH 27XANBCTRL (SUTURE) ×1 IMPLANT
SUTURE EHLN 3-0 FS-10 30 BLK (SUTURE) ×1 IMPLANT
SYR 10ML LL (SYRINGE) ×1 IMPLANT
TRAP FLUID SMOKE EVACUATOR (MISCELLANEOUS) ×1 IMPLANT
WATER STERILE IRR 500ML POUR (IV SOLUTION) ×1 IMPLANT

## 2022-08-11 NOTE — Interval H&P Note (Signed)
Patient seen and examined on day of surgery  Continues to have some bloody purulent drainage from right lateral upper hemiscrotum but overall penoscrotal edema is improved.  After discussion, agree with the he would benefit from additional incision and drainage of the draining area to expedite healing.  Risk and benefits including risk of bleeding, infection, damage surrounding structures were discussed.  All questions were answer ed.   Regular rating rhythm CTAB

## 2022-08-11 NOTE — Anesthesia Preprocedure Evaluation (Signed)
Anesthesia Evaluation  Patient identified by MRN, date of birth, ID band Patient awake    Reviewed: Allergy & Precautions, NPO status , Patient's Chart, lab work & pertinent test results  Airway Mallampati: II  TM Distance: >3 FB Neck ROM: full    Dental no notable dental hx.    Pulmonary neg pulmonary ROS, Current Smoker   Pulmonary exam normal        Cardiovascular negative cardio ROS Normal cardiovascular exam     Neuro/Psych negative neurological ROS  negative psych ROS   GI/Hepatic negative GI ROS, Neg liver ROS,,,  Endo/Other  negative endocrine ROS    Renal/GU negative Renal ROS  negative genitourinary   Musculoskeletal   Abdominal   Peds  Hematology negative hematology ROS (+)   Anesthesia Other Findings Past Medical History: No date: Abscess  Past Surgical History: No date: TONSILLECTOMY  BMI    Body Mass Index: 33.73 kg/m      Reproductive/Obstetrics negative OB ROS                             Anesthesia Physical Anesthesia Plan  ASA: 2  Anesthesia Plan: General   Post-op Pain Management: Toradol IV (intra-op)* and Ofirmev IV (intra-op)*   Induction: Intravenous  PONV Risk Score and Plan: 2 and Ondansetron, Dexamethasone, Midazolam and Treatment may vary due to age or medical condition  Airway Management Planned: LMA  Additional Equipment:   Intra-op Plan:   Post-operative Plan: Extubation in OR  Informed Consent: I have reviewed the patients History and Physical, chart, labs and discussed the procedure including the risks, benefits and alternatives for the proposed anesthesia with the patient or authorized representative who has indicated his/her understanding and acceptance.     Dental Advisory Given  Plan Discussed with: Anesthesiologist, CRNA and Surgeon  Anesthesia Plan Comments: (Patient consented for risks of anesthesia including but not limited  to:  - adverse reactions to medications - damage to eyes, teeth, lips or other oral mucosa - nerve damage due to positioning  - sore throat or hoarseness - Damage to heart, brain, nerves, lungs, other parts of body or loss of life  Patient voiced understanding.)       Anesthesia Quick Evaluation

## 2022-08-11 NOTE — Transfer of Care (Signed)
Immediate Anesthesia Transfer of Care Note  Patient: Jose Mays  Procedure(s) Performed: INCISION AND DRAINAGE ABSCESS  Patient Location: PACU  Anesthesia Type:General  Level of Consciousness: drowsy  Airway & Oxygen Therapy: Patient Spontanous Breathing and Patient connected to face mask oxygen  Post-op Assessment: Report given to RN and Post -op Vital signs reviewed and stable  Post vital signs: Reviewed and stable  Last Vitals:  Vitals Value Taken Time  BP 110/54   Temp    Pulse 68 08/11/22 0833  Resp 12 08/11/22 0833  SpO2 95 % 08/11/22 0833  Vitals shown include unvalidated device data.  Last Pain:  Vitals:   08/10/22 2334  TempSrc: Oral  PainSc:          Complications: No notable events documented.

## 2022-08-11 NOTE — Anesthesia Procedure Notes (Signed)
Procedure Name: LMA Insertion Date/Time: 08/11/2022 7:56 AM  Performed by: Lily Peer, Yvone Slape, CRNAPre-anesthesia Checklist: Patient identified, Emergency Drugs available, Suction available and Patient being monitored Patient Re-evaluated:Patient Re-evaluated prior to induction Oxygen Delivery Method: Circle system utilized Preoxygenation: Pre-oxygenation with 100% oxygen Induction Type: IV induction Ventilation: Mask ventilation without difficulty LMA: LMA inserted LMA Size: 4.0 Number of attempts: 1 Placement Confirmation: positive ETCO2 and breath sounds checked- equal and bilateral Tube secured with: Tape Dental Injury: Teeth and Oropharynx as per pre-operative assessment

## 2022-08-11 NOTE — Discharge Summary (Signed)
Physician Discharge Summary  Jose Mays GEX:[id number] DOB: [birth year] DOA: 08/09/2022  PCP: Pcp, No  Admit date: 08/09/2022 Discharge date: 08/11/2022  Admitted From: Home Disposition:  Home  Recommendations for Outpatient Follow-up:  Follow-up with urology office tomorrow a.m. for dressing change and wound check Clindamycin 450 mg every 8 hour for 7 more days Take probiotics with antibiotics to avoid diarrhea Need to schedule appointment with dermatology for history of hidradenitis suppurativa Take Tylenol/ibuprofen as needed for pain control  Home Health: None Equipment/Devices: None Discharge Condition: Stable to CODE STATUS: Full code Diet recommendation: Regular diet  Brief/Interim Summary:  27 y.o. male with medical history significant for recurrent scrotal abscesses who presents with concern for scrotal abscess.  Scrotal abscess seen on ultrasound.  Urology was consulted.  Underwent incision and drainage on 1/14.  3 areas opened, purulent material noted-sent it for the culture.  Okay to discharge from urology standpoint.  Patient needs to follow-up with urology tomorrow a.m. for dressing change and wound check.  Patient needs to be discharged on clindamycin.  Discussed with the pharmacy.  Will discharge patient on clindamycin 450 mg every 8 hours for 7 more days.  Per urology: Patient will receive call tomorrow a.m. for follow-up appointment at their office.  Discharge Diagnoses:  Scrotal abscess Obesity with BMI of 33  Discharge Instructions  Discharge Instructions     Diet general   Complete by: As directed    Increase activity slowly   Complete by: As directed    Leave dressing on - Keep it clean, dry, and intact until clinic visit   Complete by: As directed       Allergies as of 08/11/2022   No Known Allergies      Medication List     STOP taking these medications    cephALEXin 500 MG capsule Commonly known as: KEFLEX   clindamycin 1 %  gel Commonly known as: CLINDAGEL   sulfamethoxazole-trimethoprim 800-160 MG tablet Commonly known as: Bactrim DS       TAKE these medications    clindamycin 150 MG capsule Commonly known as: CLEOCIN Take 3 capsules (450 mg total) by mouth every 8 (eight) hours for 7 days.   ibuprofen 400 MG tablet Commonly known as: ADVIL Take 1 tablet (400 mg total) by mouth every 6 (six) hours as needed for mild pain (or Fever >/= 101).               Discharge Care Instructions  (From admission, onward)           Start     Ordered   08/11/22 0000  Leave dressing on - Keep it clean, dry, and intact until clinic visit        08/11/22 1103            No Known Allergies  Consultations: Urology   Procedures/Studies: CT PELVIS W CONTRAST  Result Date: 08/09/2022 CLINICAL DATA:  Scrotal swelling with right-sided boil, initial encounter EXAM: CT PELVIS WITH CONTRAST TECHNIQUE: Multidetector CT imaging of the pelvis was performed using the standard protocol following the bolus administration of intravenous contrast. RADIATION DOSE REDUCTION: This exam was performed according to the departmental dose-optimization program which includes automated exposure control, adjustment of the mA and/or kV according to patient size and/or use of iterative reconstruction technique. CONTRAST:  157mL OMNIPAQUE IOHEXOL 300 MG/ML  SOLN COMPARISON:  Ultrasound of the scrotum from earlier in the same day. FINDINGS: Urinary Tract: Bladder is partially distended. No calculi are noted.  Bowel: No obstructive or inflammatory changes of the colon or small bowel are seen. The appendix is within normal limits. Vascular/Lymphatic: No vascular abnormality is noted. Multiple inguinal lymph nodes are noted the largest of these lie on the right measuring up to 2 cm in short axis. These are likely reactive given the localized inflammatory change of the scrotum. Reproductive: Prostate is within normal limits. Diffuse  scrotal swelling is identified similar to that seen on recent ultrasound testicles are well visualized and appear within normal limits. The focal fluid appearing structure on recent ultrasound in the right scrotum is not as well appreciated due to the significant fluid attenuation related to the scrotal edema. There is some mild subcutaneous edema identified in the medial right buttock inferiorly which may represent some localized extension of edema from the perineum. Other:  No free fluid is noted within the pelvis. Musculoskeletal: No acute bony abnormality is noted. IMPRESSION: Diffuse scrotal swelling consistent with the given clinical history. The small discrete fluid areas seen on recent ultrasound is not as well appreciated on this exam due to the significant fluid attenuation from the scrotal swelling. Electronically Signed   By: Inez Catalina M.D.   On: 08/09/2022 20:38   US SCROTUM W/DOPPLER  Result Date: 08/09/2022 CLINICAL DATA:  Scrotum pain EXAM: SCROTAL ULTRASOUND DOPPLER ULTRASOUND OF THE TESTICLES TECHNIQUE: Complete ultrasound examination of the testicles, epididymis, and other scrotal structures was performed. Color and spectral Doppler ultrasound were also utilized to evaluate blood flow to the testicles. COMPARISON:  Ultrasound 05/08/2016 FINDINGS: Right testicle Measurements: 4.8 x 2.4 x 3.2 cm. No mass or microlithiasis visualized. Left testicle Measurements: 4.5 x 2.1 x 3.4 cm. No mass or microlithiasis visualized. Right epididymis:  Normal in size and appearance. Left epididymis:  Normal in size and appearance. Hydrocele:  None visualized. Varicocele:  None visualized. Pulsed Doppler interrogation of both testes demonstrates normal low resistance arterial and venous waveforms bilaterally. Marked scrotal skin thickening, edema and hyperemia. Irregular hypoechoic fluid collection measuring 1.9 x 0.6 x 3.4 cm within the thickened scrotal wall posteriorly in the region of reported boil  IMPRESSION: 1. Negative for torsion 2. Marked scrotal wall thickening and hyperemia suspicious for cellulitis. 3.4 cm irregular fluid collection at the thickened posterior scrotal wall in the region of reported boil suspicious for an abscess. Electronically Signed   By: Donavan Foil M.D.   On: 08/09/2022 18:32      Subjective: Patient seen and examined.  Resting comfortably on the bed after the procedure.  Denies any complaints.  Comfortable going home today.  Discharge Exam: Vitals:   08/11/22 0915 08/11/22 0950  BP: 118/80 110/65  Pulse: 63 (!) 50  Resp: 14 17  Temp: (!) 97.3 F (36.3 C) 97.7 F (36.5 C)  SpO2: 94% 94%   Vitals:   08/11/22 0900 08/11/22 0908 08/11/22 0915 08/11/22 0950  BP: 111/65  118/80 110/65  Pulse: (!) 49 64 63 (!) 50  Resp: 13 16 14 17   Temp:   (!) 97.3 F (36.3 C) 97.7 F (36.5 C)  TempSrc:      SpO2: 98% 96% 94% 94%  Weight:      Height:        General: Pt is alert, awake, not in acute distress, on room air, communicating well Cardiovascular: RRR, S1/S2 +, no rubs, no gallops Respiratory: CTA bilaterally, no wheezing, no rhonchi Abdominal: Soft, NT, ND, bowel sounds + Scrotum: Dressing dry and intact Extremities: no edema, no cyanosis  The results of significant diagnostics from this hospitalization (including imaging, microbiology, ancillary and laboratory) are listed below for reference.     Microbiology: Recent Results (from the past 240 hour(s))  Chlamydia/NGC rt PCR ([facility] only)     Status: None   Collection Time: 08/09/22  6:29 PM   Specimen: Urine  Result Value Ref Range Status   Specimen source GC/Chlam URINE, RANDOM  Final   Chlamydia Tr NOT DETECTED NOT DETECTED Final   N gonorrhoeae NOT DETECTED NOT DETECTED Final    Comment: (NOTE) This CT/NG assay has not been evaluated in patients with a history of  hysterectomy. Performed at [facility], [address] [place], [place] [address]      Labs: BNP (last  3 results) No results for input(s): "BNP" in the last 8760 hours. Basic Metabolic Panel: Recent Labs  Lab 08/09/22 1921 08/10/22 0620  NA 135 137  K 3.1* 3.5  CL 100 105  CO2 28 25  GLUCOSE 96 105*  BUN 14 10  CREATININE 0.91 0.83  CALCIUM 8.1* 7.8*   Liver Function Tests: Recent Labs  Lab 08/09/22 1921  AST 28  ALT 16  ALKPHOS 71  BILITOT 0.7  PROT 7.5  ALBUMIN 3.6   No results for input(s): "LIPASE", "AMYLASE" in the last 168 hours. No results for input(s): "AMMONIA" in the last 168 hours. CBC: Recent Labs  Lab 08/09/22 1921 08/10/22 0620 08/11/22 0632  WBC 14.2* 10.5 7.8  HGB 12.3* 12.0* 12.0*  HCT 37.3* 36.3* 36.3*  MCV 94.4 93.8 93.8  PLT 322 298 313   Cardiac Enzymes: No results for input(s): "CKTOTAL", "CKMB", "CKMBINDEX", "TROPONINI" in the last 168 hours. BNP: Invalid input(s): "POCBNP" CBG: No results for input(s): "GLUCAP" in the last 168 hours. D-Dimer No results for input(s): "DDIMER" in the last 72 hours. Hgb A1c No results for input(s): "HGBA1C" in the last 72 hours. Lipid Profile No results for input(s): "CHOL", "HDL", "LDLCALC", "TRIG", "CHOLHDL", "LDLDIRECT" in the last 72 hours. Thyroid function studies No results for input(s): "TSH", "T4TOTAL", "T3FREE", "THYROIDAB" in the last 72 hours.  Invalid input(s): "FREET3" Anemia work up No results for input(s): "VITAMINB12", "FOLATE", "FERRITIN", "TIBC", "IRON", "RETICCTPCT" in the last 72 hours. Urinalysis    Component Value Date/Time   COLORURINE YELLOW (A) 08/09/2022 1829   APPEARANCEUR CLEAR (A) 08/09/2022 1829   LABSPEC 1.030 08/09/2022 1829   PHURINE 7.0 08/09/2022 1829   GLUCOSEU NEGATIVE 08/09/2022 1829   HGBUR NEGATIVE 08/09/2022 1829   BILIRUBINUR NEGATIVE 08/09/2022 1829   KETONESUR NEGATIVE 08/09/2022 1829   PROTEINUR 30 (A) 08/09/2022 1829   NITRITE NEGATIVE 08/09/2022 1829   LEUKOCYTESUR SMALL (A) 08/09/2022 1829   Sepsis Labs Recent Labs  Lab 08/09/22 1921  08/10/22 0620 08/11/22 0632  WBC 14.2* 10.5 7.8   Microbiology Recent Results (from the past 240 hour(s))  Chlamydia/NGC rt PCR ([facility] only)     Status: None   Collection Time: 08/09/22  6:29 PM   Specimen: Urine  Result Value Ref Range Status   Specimen source GC/Chlam URINE, RANDOM  Final   Chlamydia Tr NOT DETECTED NOT DETECTED Final   N gonorrhoeae NOT DETECTED NOT DETECTED Final    Comment: (NOTE) This CT/NG assay has not been evaluated in patients with a history of  hysterectomy. Performed at [facility], [address], [place], [place] [address]      Time coordinating discharge: Over 30 minutes  SIGNED:   Ollen Bowl, MD  [facility]  08/11/2022, 11:05 AM Pager   If 7PM-7AM, please contact night-coverage [email or web address]

## 2022-08-11 NOTE — Plan of Care (Signed)
  Problem: Health Behavior/Discharge Planning: Goal: Ability to manage health-related needs will improve Outcome: Progressing   Problem: Clinical Measurements: Goal: Ability to maintain clinical measurements within normal limits will improve Outcome: Progressing Goal: Will remain free from infection Outcome: Progressing Goal: Diagnostic test results will improve Outcome: Progressing Goal: Respiratory complications will improve Outcome: Progressing Goal: Cardiovascular complication will be avoided Outcome: Progressing   Problem: Nutrition: Goal: Adequate nutrition will be maintained Outcome: Progressing   Problem: Coping: Goal: Level of anxiety will decrease Outcome: Progressing   Problem: Elimination: Goal: Will not experience complications related to urinary retention Outcome: Progressing   Problem: Pain Managment: Goal: General experience of comfort will improve Outcome: Progressing   Problem: Skin Integrity: Goal: Risk for impaired skin integrity will decrease Outcome: Progressing

## 2022-08-11 NOTE — Anesthesia Postprocedure Evaluation (Signed)
Anesthesia Post Note  Patient: Jose Mays  Procedure(s) Performed: INCISION AND DRAINAGE ABSCESS  Patient location during evaluation: PACU Anesthesia Type: General Level of consciousness: awake and alert Pain management: pain level controlled Vital Signs Assessment: post-procedure vital signs reviewed and stable Respiratory status: spontaneous breathing, nonlabored ventilation, respiratory function stable and patient connected to nasal cannula oxygen Cardiovascular status: blood pressure returned to baseline and stable Postop Assessment: no apparent nausea or vomiting Anesthetic complications: no   No notable events documented.   Last Vitals:  Vitals:   08/11/22 0908 08/11/22 0915  BP:  118/80  Pulse: 64 63  Resp: 16 14  Temp:  (!) 36.3 C  SpO2: 96% 94%    Last Pain:  Vitals:   08/11/22 0915  TempSrc:   PainSc: Asleep                 Ilene Qua

## 2022-08-11 NOTE — Plan of Care (Signed)
  Problem: Clinical Measurements: Goal: Diagnostic test results will improve Outcome: Progressing   Problem: Clinical Measurements: Goal: Cardiovascular complication will be avoided Outcome: Progressing   Problem: Coping: Goal: Level of anxiety will decrease Outcome: Progressing   Problem: Pain Managment: Goal: General experience of comfort will improve Outcome: Progressing   

## 2022-08-11 NOTE — Op Note (Addendum)
Date of procedure: 08/11/22  Preoperative diagnosis:  Scrotal abscess Chronic hydradenitis   Postoperative diagnosis:  As above  Procedure: Scrotal incision and drainage  Surgeon: Hollice Espy, MD  Anesthesia: General  Complications: None  Intraoperative findings: 3 areas opened including right lateral x 2 and right inferior scrotum, purulent material noted at all 3 locations with possible subclinical sinus tract between areas  EBL: 10 cc  Specimens: wound culture  Drains: none  Indication: Jose Mays is a 27 y.o. patient with scrotal abcess.  After reviewing the management options for treatment, he elected to proceed with the above surgical procedure(s). We have discussed the potential benefits and risks of the procedure, side effects of the proposed treatment, the likelihood of the patient achieving the goals of the procedure, and any potential problems that might occur during the procedure or recuperation. Informed consent has been obtained.  Description of procedure:  The patient was taken to the operating room and general anesthesia was induced.  The patient was placed in the dorsal lithotomy position, prepped and draped in the usual sterile fashion, and preoperative antibiotics were administered. A preoperative time-out was performed.   The scrotum was carefully inspected under anesthesia.  There was significant scarring in both of the inguinal folds.  Pathology today appeared to be primarily right-sided.  There were 2 areas of granulation tissue measuring about 1 cm each, 1 in the right lateral scrotum which was slightly superior and a second area approximately 1-1/2 cm away from this area slightly more inferior and medially.  With deep palpation, both of these areas were already draining scant amount of purulent bloody fluid.  There is also an area within the dependent scrotum which had no apparent opening or sinus tract which was now more fluctuant than previously  noted on exam measuring at least 3 cm x 2 cm and with deep palpation to this area of the dependent hemiscrotum, the more lateral superior area actually drained suggestive of a sinus tract between each of these.  Using #11 blade, made approximately 1 and half centimeter lengthwise incision among the superior more lateral area.  Upon opening this, I drained a small amount of purulent material and I also took a swab of this area for culture.  This cavity itself was not particularly deep and had granulation tissue and fibrinous material at the base.  I both bluntly wiped as well as grossly dissected the fibrinous material away leaving a healthy wound base.  I then opened up the more medial inferior incision which had a very similar appearance.  There was a slightly larger cavity tracking superior medially on this side and these 2 tracts did not seem to communicate.  Finally, I addressed the dependent portion of the scrotum.  I cannot find a clinically significant sinus tract between either of these 2 more inguinally located openings however because expression of this area did lead to drainage I one of the openings, I elected to open the inferior portion of the scrotum.  Made about a 2 cm long incision in the right inferior portion of the scrotum and upon opening, there is some bleeding and fibrinous material however no particular purulent material was drained from this area.  I was able to open this area bluntly using my finger this is a very large cavity much larger than the other 2.  Again, unfortunately I could not find a obvious tract connecting this inferior aspect to the other 2 however based on exam, there must have been a  subclinical tract.  Irrigate all 3 wounds copiously and Bovie was used just within the inferior portion to stop bleeding at the skin level.  I then instilled 20 cc of Marcaine mixed with lidocaine around each of the wound edges.  I packed the inferior wound using 1 inch packing tape and the  smaller 2 more superior and lateral areas with quarter inch iodoform gauze.  4 x 4's and ABD pad and mesh underwear were applied.  Patient was then cleaned and dried, repositioned in the supine position, reversed from anesthesia, and taken to the PACU in stable condition.  Plan: Given that his penoscrotal edema has improved signficantly and there is only a small amount of purulent material today which are now open and draining, discharge this afternoon versus tomorrow is reasonable.  If he is discharged today, we can arrange for outpatient follow-up tomorrow for dressing change/packing exchange.  He may need to be discharged on p.o. Keflex.  Follow-up wound cultures.  Hollice Espy, M.D.

## 2022-08-12 ENCOUNTER — Encounter: Payer: Self-pay | Admitting: Urology

## 2022-08-12 ENCOUNTER — Ambulatory Visit (INDEPENDENT_AMBULATORY_CARE_PROVIDER_SITE_OTHER): Payer: Medicaid Other | Admitting: Physician Assistant

## 2022-08-12 VITALS — BP 112/73 | HR 75 | Wt 206.0 lb

## 2022-08-12 DIAGNOSIS — L732 Hidradenitis suppurativa: Secondary | ICD-10-CM

## 2022-08-12 DIAGNOSIS — N492 Inflammatory disorders of scrotum: Secondary | ICD-10-CM

## 2022-08-12 NOTE — Progress Notes (Addendum)
Patient presented to clinic today for wound check and dressing change 1 day after undergoing right scrotal I&D with Dr. Erlene Quan.  He denies fevers and reports he is feeling well today.  Using clean technique, I removed the packing from all 3 abscess pockets along the right hemiscrotum.  Wound beds are healthy appearing with granulation tissue throughout.  There is no purulent drainage or evidence of necrosis. Patient tolerated well.  Using sterile technique, I replaced the packing using quarter inch iodoform gauze, leaving a 2 cm wick at each site.  He reports he will be unable to replace his packing due to pain and wound location.  Will have him remove 2 to 3 cm of packing daily to ease healing.  I am also placing a referral today for dermatology for management of his chronic hidradenitis suppurativa.  He currently smokes and I encouraged him in tobacco cessation, explaining that this is likely contributing to his HS.  He expressed understanding.

## 2022-08-13 LAB — AEROBIC/ANAEROBIC CULTURE W GRAM STAIN (SURGICAL/DEEP WOUND)

## 2023-08-01 ENCOUNTER — Encounter: Payer: Self-pay | Admitting: Emergency Medicine

## 2023-08-01 ENCOUNTER — Other Ambulatory Visit: Payer: Self-pay

## 2023-08-01 DIAGNOSIS — L0231 Cutaneous abscess of buttock: Secondary | ICD-10-CM | POA: Insufficient documentation

## 2023-08-01 NOTE — ED Triage Notes (Signed)
 Patient reports cyst in crease of buttocks since Sunday. Endorses redness and pain to area.

## 2023-08-02 ENCOUNTER — Emergency Department
Admission: EM | Admit: 2023-08-02 | Discharge: 2023-08-02 | Disposition: A | Discharge status: Home / Self Care | Payer: Self-pay | Attending: Emergency Medicine | Admitting: Emergency Medicine

## 2023-08-02 DIAGNOSIS — L0291 Cutaneous abscess, unspecified: Secondary | ICD-10-CM

## 2023-08-02 MED ORDER — KETOROLAC TROMETHAMINE 30 MG/ML IJ SOLN
30.0000 mg | Freq: Once | INTRAMUSCULAR | Status: AC
  Administered 2023-08-02: 30 mg via INTRAMUSCULAR
  Filled 2023-08-02: qty 1

## 2023-08-02 MED ORDER — SULFAMETHOXAZOLE-TRIMETHOPRIM 800-160 MG PO TABS
1.0000 | ORAL_TABLET | Freq: Once | ORAL | Status: AC
  Administered 2023-08-02: 1 via ORAL
  Filled 2023-08-02: qty 1

## 2023-08-02 MED ORDER — SULFAMETHOXAZOLE-TRIMETHOPRIM 800-160 MG PO TABS: 1.0000 | ORAL_TABLET | Freq: Two times a day (BID) | ORAL | 0 refills | Status: AC

## 2023-08-02 NOTE — ED Provider Notes (Signed)
 [facility] Provider Note    Event Date/Time   First MD Initiated Contact with Patient 08/02/23 [phone]     (approximate)   History   Cyst   HPI  Rumi Costilla is a 28 y.o. male who presents to the ED for evaluation of Cyst   I reviewed urology clinic information from 1/15, hospital admission just prior to this where he was admitted for scrotal abscess requiring urologic operative I&D.  Subsequently referred to dermatology due to hidradenitis suppurativa.   Patient presents to the ED for evaluation of an abscess on his gluteal cleft on the left.  Has been present for the past 4 days.  Reports it did start to drain a bloody purulent material in the past few hours since he has been waiting but is still quite painful.   Physical Exam   Triage Vital Signs: ED Triage Vitals  Encounter Vitals Group     BP 08/01/23 2333 133/81     Systolic BP Percentile --      Diastolic BP Percentile --      Pulse Rate 08/01/23 2333 87     Resp 08/01/23 2333 18     Temp 08/01/23 2333 97.8 F (36.6 C)     Temp Source 08/01/23 2333 Oral     SpO2 08/01/23 2333 95 %     Weight 08/01/23 2334 190 lb (86.2 kg)     Height 08/01/23 2334 5' 6 (1.676 m)     Head Circumference --      Peak Flow --      Pain Score 08/01/23 2334 10     Pain Loc --      Pain Education --      Exclude from Growth Chart --     Most recent vital signs: Vitals:   08/01/23 2333  BP: 133/81  Pulse: 87  Resp: 18  Temp: 97.8 F (36.6 C)  SpO2: 95%    General: Awake, no distress.  Laying prone on the stretcher when I enter the room.  Look systemically well. CV:  Good peripheral perfusion.  Resp:  Normal effort.  Abd:  No distention.  MSK:  No deformity noted.  Neuro:  No focal deficits appreciated. Other:  To the mid left gluteal cleft is an area of fluctuance.  Superiormost aspect of it is draining some bloody material but   ED Results / Procedures / Treatments   Labs (all labs  ordered are listed, but only abnormal results are displayed) Labs Reviewed - No data to display  EKG   RADIOLOGY   Official radiology report(s): No results found.  PROCEDURES and INTERVENTIONS:  Procedures  Medications  ketorolac  (TORADOL ) 30 MG/ML injection 30 mg (30 mg Intramuscular Given 08/02/23 0434)  sulfamethoxazole -trimethoprim  (BACTRIM  DS) 800-160 MG per tablet 1 tablet (1 tablet Oral Given 08/02/23 0434)     IMPRESSION / MDM / ASSESSMENT AND PLAN / ED COURSE  I reviewed the triage vital signs and the nursing notes.  Differential diagnosis includes, but is not limited to, abscess, sepsis, cellulitis  {Patient presents with symptoms of an acute illness or injury that is potentially life-threatening.  Patient presents with a superficial abscess to the skin of the gluteal cleft suitable for outpatient management with antibiotics.  Look systemically well, doubt sepsis or more severe illness.  Started on Bactrim .  Area is already draining.  Discharged with information to see surgery  Clinical Course as of 08/02/23 0552  Sat Aug 02, 2023  0541  Reassessed and went to perform an I&D but he reports when he was in the bathroom and it started draining.  I reexamined him and it does seem to have decompressed. I express what pus that I can.  Shared decision making yields plan not to pursue further incision.  This gust antibiotics, care at home and surgical follow-up [DS]    Clinical Course User Index [DS] Claudene Rover, MD     FINAL CLINICAL IMPRESSION(S) / ED DIAGNOSES   Final diagnoses:  Abscess     Rx / DC Orders   ED Discharge Orders          Ordered    sulfamethoxazole -trimethoprim  (BACTRIM  DS) 800-160 MG tablet  2 times daily        08/02/23 0545             Note:  This document was prepared using Dragon voice recognition software and may include unintentional dictation errors.   Claudene Rover, MD 08/02/23 [phone]

## 2023-08-02 NOTE — Discharge Instructions (Addendum)
 Bactrim  antibiotics twice daily for a week  Keep squeezing it like is it to remove pus.  Please take Tylenol  and ibuprofen /Advil  for your pain.  It is safe to take them together, or to alternate them every few hours.  Take up to 1000mg  of Tylenol  at a time, up to 4 times per day.  Do not take more than 4000 mg of Tylenol  in 24 hours.  For ibuprofen , take 400-600 mg, 3 - 4 times per day.

## 2023-08-06 ENCOUNTER — Ambulatory Visit: Payer: Medicaid Other | Admitting: Dermatology

## 2023-08-14 ENCOUNTER — Encounter: Payer: Self-pay | Admitting: Dermatology

## 2023-08-14 ENCOUNTER — Ambulatory Visit: Payer: Self-pay | Admitting: Dermatology

## 2023-08-14 DIAGNOSIS — Z79899 Other long term (current) drug therapy: Secondary | ICD-10-CM

## 2023-08-14 DIAGNOSIS — L732 Hidradenitis suppurativa: Secondary | ICD-10-CM

## 2023-08-14 DIAGNOSIS — Z7189 Other specified counseling: Secondary | ICD-10-CM

## 2023-08-14 NOTE — Progress Notes (Signed)
   New Patient Visit   Subjective  Jose Mays is a 28 y.o. male who presents for the following: HS. Groin, buttocks, axilla. Has had areas drained in the past. Has been on oral antibiotics in the past. Has not had active lesions for over a year, flaring over last few weeks. Was seen at ED 08/02/2023. Finished 7 day course of Bactrim 2 days ago.  Dur: ~10 years.   The following portions of the chart were reviewed this encounter and updated as appropriate: medications, allergies, medical history  Review of Systems:  No other skin or systemic complaints except as noted in HPI or Assessment and Plan.  Objective  Well appearing patient in no apparent distress; mood and affect are within normal limits.  A focused examination was performed of the following areas:  Face, axillae, buttocks, groin.  Relevant exam findings are noted in the Assessment and Plan.    Assessment & Plan   HIDRADENITIS SUPPURATIVA   Related Procedures Comprehensive metabolic panel CBC with Differential/Platelet Hepatitis B surface antibody,qualitative Hepatitis B surface antigen Hepatitis B core antibody, total Hepatitis C antibody HIV Antibody (routine testing w rflx) QuantiFERON-TB Gold Plus ENCOUNTER FOR LONG-TERM (CURRENT) USE OF HIGH-RISK MEDICATION   Related Procedures Comprehensive metabolic panel CBC with Differential/Platelet Hepatitis B surface antibody,qualitative Hepatitis B surface antigen Hepatitis B core antibody, total Hepatitis C antibody HIV Antibody (routine testing w rflx) QuantiFERON-TB Gold Plus COUNSELING AND COORDINATION OF CARE    HIDRADENITIS SUPPURATIVA Exam: right medial buttock with inflamed sinus tract, L lat scrotum with 2 inflamed sinus tracts, R inferior axillary with inflamed nodule. Hypertrophic scarring at L axilla. B/L angles of mandible with inflammatory cysts.   Chronic and persistent condition with duration or expected duration over one year. Condition  is bothersome/symptomatic for patient. Currently flared.  Patient denies IBS or crohn's disease.  Hidradenitis Suppurativa is a chronic; persistent; non-curable, but treatable condition due to abnormal inflamed sweat glands in the body folds (axilla, inframammary, groin, medial thighs), causing recurrent painful draining cysts and scarring. It can be associated with severe scarring acne and cysts; also abscesses and scarring of scalp. The goal is control and prevention of flares, as it is not curable. Scars are permanent and can be thickened. Treatment may include daily use of topical medication and oral antibiotics.  Oral isotretinoin may also be helpful.  For some cases, Humira or Cosentyx (biologic injections) may be prescribed to decrease the inflammatory process and prevent flares.  When indicated, inflamed cysts may also be treated surgically.  Treatment Plan: Discussed adding oral antibiotic today, will defer for now.  Plan to start Cosentyx 300 mg weekly x 5 weeks then monthly pending normal labs and insurance approval.   Return in about 2 months (around 10/12/2023) for HS Recheck, Cosentyx Recheck.  I, Lawson Radar, CMA, am acting as scribe for Elie Goody, MD.   Documentation: I have reviewed the above documentation for accuracy and completeness, and I agree with the above.  Elie Goody, MD

## 2023-08-14 NOTE — Patient Instructions (Signed)
Plan to start Cosentyx pending normal labs and insurance approval.     Due to recent changes in healthcare laws, you may see results of your pathology and/or laboratory studies on MyChart before the doctors have had a chance to review them. We understand that in some cases there may be results that are confusing or concerning to you. Please understand that not all results are received at the same time and often the doctors may need to interpret multiple results in order to provide you with the best plan of care or course of treatment. Therefore, we ask that you please give [place] 2 business days to thoroughly review all your results before contacting the office for clarification. Should we see a critical lab result, you will be contacted sooner.   If You Need Anything After Your Visit  If you have any questions or concerns for your doctor, please call our main line at [phone] and press option 4 to reach your doctor's medical assistant. If no one answers, please leave a voicemail as directed and we will return your call as soon as possible. Messages left after 4 pm will be answered the following business day.   You may also send [place] a message via MyChart. We typically respond to MyChart messages within 1-2 business days.  For prescription refills, please ask your pharmacy to contact our office. Our fax number is [phone].  If you have an urgent issue when the clinic is closed that cannot wait until the next business day, you can page your doctor at the number below.    Please note that while we do our best to be available for urgent issues outside of office hours, we are not available 24/7.   If you have an urgent issue and are unable to reach [place], you may choose to seek medical care at your doctor's office, retail clinic, urgent care center, or emergency room.  If you have a medical emergency, please immediately call 911 or go to the emergency department.  Pager Numbers  - Dr. Gwen Pounds:  [phone]  - Dr. Roseanne Reno: [phone]  - Dr. Katrinka Blazing: [phone]   In the event of inclement weather, please call our main line at [phone] for an update on the status of any delays or closures.  Dermatology Medication Tips: Please keep the boxes that topical medications come in in order to help keep track of the instructions about where and how to use these. Pharmacies typically print the medication instructions only on the boxes and not directly on the medication tubes.   If your medication is too expensive, please contact our office at [phone] option 4 or send [place] a message through MyChart.   We are unable to tell what your co-pay for medications will be in advance as this is different depending on your insurance coverage. However, we may be able to find a substitute medication at lower cost or fill out paperwork to get insurance to cover a needed medication.   If a prior authorization is required to get your medication covered by your insurance company, please allow [place] 1-2 business days to complete this process.  Drug prices often vary depending on where the prescription is filled and some pharmacies may offer cheaper prices.  The website www.goodrx.com contains coupons for medications through different pharmacies. The prices here do not account for what the cost may be with help from insurance (it may be cheaper with your insurance), but the website can give you the price if you did not  use any insurance.  - You can print the associated coupon and take it with your prescription to the pharmacy.  - You may also stop by our office during regular business hours and pick up a GoodRx coupon card.  - If you need your prescription sent electronically to a different pharmacy, notify our office through [facility] or by phone at [phone] option 4.     Si Usted Necesita Algo Despus de Su Visita  Tambin puede enviarnos un mensaje a travs de Clinical cytogeneticist. Por lo general  respondemos a los mensajes de MyChart en el transcurso de 1 a 2 das hbiles.  Para renovar recetas, por favor pida a su farmacia que se ponga en contacto con nuestra oficina. Annie Sable de fax es [place] [phone].  Si tiene un asunto urgente cuando la clnica est cerrada y que no puede esperar hasta el siguiente da hbil, puede llamar/localizar a su doctor(a) al nmero que aparece a continuacin.   Por favor, tenga en cuenta que aunque hacemos todo lo posible para estar disponibles para asuntos urgentes fuera del horario de [place], no estamos disponibles las 24 horas del da, los 7 [address]  [address] de la [place].   Si tiene un problema urgente y no puede comunicarse con nosotros, puede optar por buscar atencin mdica  en el consultorio de su doctor(a), en una clnica privada, en un centro de atencin urgente o en una sala de emergencias.  Si tiene Engineer, drilling, por favor llame inmediatamente al 911 o vaya a la sala de emergencias.  Nmeros de bper  - Dr. Gwen Pounds: [phone]  - Dra. Roseanne Reno: [phone]  - Dr. Katrinka Blazing: [phone]   En caso de inclemencias del tiempo, por favor llame a Lacy Duverney principal al [phone] para una actualizacin sobre el [place] de cualquier retraso o cierre.  Consejos para la medicacin en dermatologa: Por favor, guarde las cajas en las que vienen los medicamentos de uso tpico para ayudarle a seguir las instrucciones sobre dnde y cmo usarlos. Las farmacias generalmente imprimen las instrucciones del medicamento slo en las cajas y no directamente en los tubos del [place].   Si su medicamento es muy caro, por favor, pngase en contacto con Rolm Gala llamando al [phone] y presione la opcin 4 o envenos un mensaje a travs de Clinical cytogeneticist.   No podemos decirle cul ser su copago por los medicamentos por adelantado ya que esto es diferente dependiendo de la cobertura de su seguro. Sin embargo, es posible que podamos encontrar un  medicamento sustituto a Audiological scientist un formulario para que el seguro cubra el medicamento que se considera necesario.   Si se requiere una autorizacin previa para que su compaa de seguros [place] su medicamento, por favor permtanos de 1 a 2 das hbiles para completar [address]  Los precios de los medicamentos varan con frecuencia dependiendo del Environmental consultant de dnde se surte la receta y alguna farmacias pueden ofrecer precios ms baratos.  El sitio web www.goodrx.com tiene cupones para medicamentos de Health and safety inspector. Los precios aqu no tienen en cuenta lo que podra costar con la ayuda del seguro (puede ser ms barato con su seguro), pero el sitio web puede darle el precio si no utiliz Tourist information centre manager.  - Puede imprimir el cupn correspondiente y llevarlo con su receta a la farmacia.  - Tambin puede pasar por nuestra oficina durante el horario de atencin regular y Education officer, museum una tarjeta de cupones de GoodRx.  - Si necesita que su receta se enve  electrnicamente a [place] diferente, informe a nuestra oficina a travs de MyChart de  o por telfono llamando al [phone] y presione la opcin 4.

## 2023-09-29 ENCOUNTER — Ambulatory Visit: Payer: Self-pay | Admitting: Dermatology

## 2023-10-01 ENCOUNTER — Emergency Department
Admission: EM | Admit: 2023-10-01 | Discharge: 2023-10-02 | Disposition: A | Discharge status: Home / Self Care | Payer: MEDICAID | Attending: Emergency Medicine | Admitting: Emergency Medicine

## 2023-10-01 ENCOUNTER — Other Ambulatory Visit: Payer: Self-pay

## 2023-10-01 DIAGNOSIS — F12188 Cannabis abuse with other cannabis-induced disorder: Secondary | ICD-10-CM | POA: Insufficient documentation

## 2023-10-01 DIAGNOSIS — R112 Nausea with vomiting, unspecified: Secondary | ICD-10-CM | POA: Insufficient documentation

## 2023-10-01 DIAGNOSIS — E876 Hypokalemia: Secondary | ICD-10-CM | POA: Insufficient documentation

## 2023-10-01 DIAGNOSIS — F1290 Cannabis use, unspecified, uncomplicated: Secondary | ICD-10-CM

## 2023-10-01 LAB — CBC
HCT: 44.7 % (ref 39.0–52.0)
Hemoglobin: 15.4 g/dL (ref 13.0–17.0)
MCH: 32 pg (ref 26.0–34.0)
MCHC: 34.5 g/dL (ref 30.0–36.0)
MCV: 92.7 fL (ref 80.0–100.0)
Platelets: 400 10*3/uL (ref 150–400)
RBC: 4.82 MIL/uL (ref 4.22–5.81)
RDW: 12.2 % (ref 11.5–15.5)
WBC: 9.1 10*3/uL (ref 4.0–10.5)
nRBC: 0 % (ref 0.0–0.2)

## 2023-10-01 LAB — COMPREHENSIVE METABOLIC PANEL
ALT: 18 U/L (ref 0–44)
AST: 27 U/L (ref 15–41)
Albumin: 4.7 g/dL (ref 3.5–5.0)
Alkaline Phosphatase: 79 U/L (ref 38–126)
Anion gap: 15 (ref 5–15)
BUN: 14 mg/dL (ref 6–20)
CO2: 22 mmol/L (ref 22–32)
Calcium: 9.5 mg/dL (ref 8.9–10.3)
Chloride: 102 mmol/L (ref 98–111)
Creatinine, Ser: 1.05 mg/dL (ref 0.61–1.24)
GFR, Estimated: >60 mL/min (ref >60)
Glucose, Bld: 155 mg/dL — ABNORMAL HIGH (ref 70–99)
Potassium: 3.2 mmol/L — ABNORMAL LOW (ref 3.5–5.1)
Sodium: 139 mmol/L (ref 135–145)
Total Bilirubin: 1 mg/dL (ref 0.0–1.2)
Total Protein: 8.5 g/dL — ABNORMAL HIGH (ref 6.5–8.1)

## 2023-10-01 LAB — LIPASE, BLOOD: Lipase: 25 U/L (ref 11–51)

## 2023-10-01 MED ORDER — DROPERIDOL 2.5 MG/ML IJ SOLN
2.5000 mg | Freq: Once | INTRAMUSCULAR | Status: AC
  Administered 2023-10-01: 2.5 mg via INTRAVENOUS
  Filled 2023-10-01: qty 2

## 2023-10-01 MED ORDER — SODIUM CHLORIDE 0.9 % IV BOLUS
1000.0000 mL | Freq: Once | INTRAVENOUS | Status: AC
  Administered 2023-10-01: 1000 mL via INTRAVENOUS

## 2023-10-01 NOTE — ED Triage Notes (Signed)
 Pt BIB EMS with c/o n/v that started this morning. Pt also endorses ABD pain.

## 2023-10-01 NOTE — ED Triage Notes (Addendum)
 EMS brings pt in for c/o left lower abd pain today accomp by N/V; pt to lobby via w/c, immed lies down in the floor, spitting, restless, diaphoretic; pt instr back into a w/c; st "feels better lying down"; pt admits to hx hyperemesis cannabis when asked about hx of same and drug use

## 2023-10-01 NOTE — ED Provider Notes (Signed)
 [facility] Provider Note    Event Date/Time   First MD Initiated Contact with Patient 10/01/23 2310     (approximate)   History   Emesis   HPI  Jose Mays is a 28 y.o. male brought to the ED via EMS from home with a chief complaint of left lower abdominal pain associated with nausea/vomiting all day.  Denies fever/chills, chest pain, shortness of breath, dysuria or diarrhea.  Endorses history of CHS.     Past Medical History   Past Medical History:  Diagnosis Date   Abscess      Active Problem List   Patient Active Problem List   Diagnosis Date Noted   Scrotal abscess 08/09/2022   Acute gastroenteritis 05/13/2017   Hypokalemia 05/23/2016   Nausea & vomiting 05/23/2016     Past Surgical History   Past Surgical History:  Procedure Laterality Date   INCISION AND DRAINAGE ABSCESS N/A 08/11/2022   Procedure: INCISION AND DRAINAGE ABSCESS;  Surgeon: Vanna Scotland, MD;  Location: [facility] ORS;  Service: Urology;  Laterality: N/A;   TONSILLECTOMY       Home Medications   Prior to Admission medications   Medication Sig Start Date End Date Taking? Authorizing Provider  ondansetron (ZOFRAN-ODT) 4 MG disintegrating tablet Take 1 tablet (4 mg total) by mouth every 8 (eight) hours as needed for nausea or vomiting. 10/02/23  Yes Irean Hong, MD  ibuprofen (ADVIL) 400 MG tablet Take 1 tablet (400 mg total) by mouth every 6 (six) hours as needed for mild pain (or Fever >/= 101). 08/11/22   Pahwani, Kasandra Knudsen, MD     Allergies  Patient has no known allergies.   Family History   Family History  Problem Relation Age of Onset   Prostate cancer Paternal Uncle    Rheum arthritis Paternal Grandmother    Bladder Cancer Neg Hx      Physical Exam  Triage Vital Signs: ED Triage Vitals  Encounter Vitals Group     BP 10/01/23 2249 (!) 127/115     Systolic BP Percentile --      Diastolic BP Percentile --      Pulse Rate 10/01/23 2249 67      Resp 10/01/23 2249 (!) 24     Temp 10/01/23 2249 (!) 97.3 F (36.3 C)     Temp Source 10/01/23 2249 Oral     SpO2 10/01/23 2249 99 %     Weight 10/01/23 2247 190 lb (86.2 kg)     Height --      Head Circumference --      Peak Flow --      Pain Score 10/01/23 2246 10     Pain Loc --      Pain Education --      Exclude from Growth Chart --     Updated Vital Signs: BP 122/84   Pulse (!) 58   Temp 97.8 F (36.6 C)   Resp 20   Wt 86.2 kg   SpO2 95%   BMI 30.67 kg/m    General: Awake, moderate distress.  Restless. CV:  RRR.  Good peripheral perfusion.  Resp:  Normal effort.  CTAB. Abd:  Mild diffuse tenderness to palpation without rebound or guarding.  No distention.  Other:  No truncal vesicles.   ED Results / Procedures / Treatments  Labs (all labs ordered are listed, but only abnormal results are displayed) Labs Reviewed  COMPREHENSIVE METABOLIC PANEL - Abnormal; Notable for the  following components:      Result Value   Potassium 3.2 (*)    Glucose, Bld 155 (*)    Total Protein 8.5 (*)    All other components within normal limits  LIPASE, BLOOD  CBC  URINALYSIS, ROUTINE W REFLEX MICROSCOPIC  URINE DRUG SCREEN, QUALITATIVE ([facility] ONLY)     EKG  ED ECG REPORT I, Severino Paolo J, the attending physician, personally viewed and interpreted this ECG.   Date: 10/01/2023  EKG Time: 2306  Rate: 80  Rhythm: normal sinus rhythm  Axis: RAD  Intervals: QTc 491  ST&T Change: Nonspecific    RADIOLOGY None   Official radiology report(s): No results found.   PROCEDURES:  Critical Care performed: No  .1-3 Lead EKG Interpretation  Performed by: Irean Hong, MD Authorized by: Irean Hong, MD     Interpretation: normal     ECG rate:  80   ECG rate assessment: normal     Rhythm: sinus rhythm     Ectopy: none     Conduction: normal   Comments:     Patient placed on cardiac monitor to evaluate for arrhythmias    MEDICATIONS ORDERED IN ED: Medications   sodium chloride 0.9 % bolus 1,000 mL (0 mLs Intravenous Stopped 10/02/23 0134)  droperidol (INAPSINE) 2.5 MG/ML injection 2.5 mg (2.5 mg Intravenous Given 10/01/23 2314)  potassium chloride 10 mEq in 100 mL IVPB (0 mEq Intravenous Stopped 10/02/23 0252)  famotidine (PEPCID) IVPB 20 mg premix (0 mg Intravenous Stopped 10/02/23 0134)     IMPRESSION / MDM / ASSESSMENT AND PLAN / ED COURSE  I reviewed the triage vital signs and the nursing notes.                             28 year old male presenting with abdominal pain, nausea/vomiting. Differential diagnosis includes, but is not limited to, acute appendicitis, renal colic, testicular torsion, urinary tract infection/pyelonephritis, prostatitis,  epididymitis, diverticulitis, small bowel obstruction or ileus, colitis, abdominal aortic aneurysm, gastroenteritis, hernia, etc. I personally reviewed patient's records and note hospitalization for scrotal abscess 08/09/2022.  Patient's presentation is most consistent with acute complicated illness / injury requiring diagnostic workup.  The patient is on the cardiac monitor to evaluate for evidence of arrhythmia and/or significant heart rate changes.  Will obtain lab work, initiate IV fluid resuscitation, IV Droperidol for emesis and reassess.  Clinical Course as of 10/02/23 0509  Thu Oct 02, 2023  0009 Patient sleeping soundly in no acute distress.  Will continue to monitor and care for patient. [JS]  0149 Patient continues to sleep soundly in no acute distress. [JS]  [id number] Patient stood up to urinate; he was steady on his feet.  Tolerated ice chips without emesis.  States he is overall feeling significantly better.  Will discharge home with as needed Zofran prescription and patient will follow-up closely with his PCP.  Strict return precautions given.  Patient verbalizes understanding and agrees with plan of care. [JS]    Clinical Course User Index [JS] Irean Hong, MD     FINAL CLINICAL  IMPRESSION(S) / ED DIAGNOSES   Final diagnoses:  Nausea and vomiting, unspecified vomiting type  Cannabinoid hyperemesis syndrome  Hypokalemia     Rx / DC Orders   ED Discharge Orders          Ordered    ondansetron (ZOFRAN-ODT) 4 MG disintegrating tablet  Every 8 hours PRN  10/02/23 0315             Note:  This document was prepared using Dragon voice recognition software and may include unintentional dictation errors.   Irean Hong, MD 10/02/23 [phone]

## 2023-10-02 MED ORDER — ONDANSETRON 4 MG PO TBDP: 4.0000 mg | ORAL_TABLET | Freq: Three times a day (TID) | ORAL | 0 refills | Status: DC | PRN

## 2023-10-02 MED ORDER — FAMOTIDINE IN NACL 20-0.9 MG/50ML-% IV SOLN
20.0000 mg | Freq: Once | INTRAVENOUS | Status: AC
  Administered 2023-10-02: 20 mg via INTRAVENOUS
  Filled 2023-10-02: qty 50

## 2023-10-02 MED ORDER — POTASSIUM CHLORIDE 10 MEQ/100ML IV SOLN
10.0000 meq | Freq: Once | INTRAVENOUS | Status: AC
  Administered 2023-10-02: 10 meq via INTRAVENOUS
  Filled 2023-10-02: qty 100

## 2023-10-02 NOTE — Discharge Instructions (Signed)
 You may take Zofran as needed for nausea/vomiting.  Clear liquids x 12 hours, then bland diet x 3 days, then slowly advance diet as tolerated.  Return to the ER for worsening symptoms, persistent vomiting, difficulty breathing or other concerns.

## 2023-12-09 ENCOUNTER — Emergency Department: Payer: MEDICAID

## 2023-12-09 ENCOUNTER — Encounter: Payer: Self-pay | Admitting: Emergency Medicine

## 2023-12-09 ENCOUNTER — Other Ambulatory Visit: Payer: Self-pay

## 2023-12-09 ENCOUNTER — Emergency Department
Admission: EM | Admit: 2023-12-09 | Discharge: 2023-12-09 | Disposition: A | Discharge status: Home / Self Care | Payer: MEDICAID | Attending: Emergency Medicine | Admitting: Emergency Medicine

## 2023-12-09 DIAGNOSIS — D72829 Elevated white blood cell count, unspecified: Secondary | ICD-10-CM | POA: Insufficient documentation

## 2023-12-09 DIAGNOSIS — R1115 Cyclical vomiting syndrome unrelated to migraine: Secondary | ICD-10-CM | POA: Insufficient documentation

## 2023-12-09 DIAGNOSIS — R1084 Generalized abdominal pain: Secondary | ICD-10-CM | POA: Insufficient documentation

## 2023-12-09 HISTORY — DX: Cannabis use, unspecified, uncomplicated: F12.90

## 2023-12-09 HISTORY — DX: Nausea with vomiting, unspecified: R11.2

## 2023-12-09 LAB — CBC WITH DIFFERENTIAL/PLATELET
Abs Immature Granulocytes: 0.08 10*3/uL — ABNORMAL HIGH (ref 0.00–0.07)
Basophils Absolute: 0.1 10*3/uL (ref 0.0–0.1)
Basophils Relative: 0 %
Eosinophils Absolute: 0 10*3/uL (ref 0.0–0.5)
Eosinophils Relative: 0 %
HCT: 45.2 % (ref 39.0–52.0)
Hemoglobin: 15.1 g/dL (ref 13.0–17.0)
Immature Granulocytes: 1 %
Lymphocytes Relative: 19 %
Lymphs Abs: 2.6 10*3/uL (ref 0.7–4.0)
MCH: 31.6 pg (ref 26.0–34.0)
MCHC: 33.4 g/dL (ref 30.0–36.0)
MCV: 94.6 fL (ref 80.0–100.0)
Monocytes Absolute: 1 10*3/uL (ref 0.1–1.0)
Monocytes Relative: 7 %
Neutro Abs: 9.5 10*3/uL — ABNORMAL HIGH (ref 1.7–7.7)
Neutrophils Relative %: 73 %
Platelets: 408 10*3/uL — ABNORMAL HIGH (ref 150–400)
RBC: 4.78 MIL/uL (ref 4.22–5.81)
RDW: 11.9 % (ref 11.5–15.5)
WBC: 13.2 10*3/uL — ABNORMAL HIGH (ref 4.0–10.5)
nRBC: 0 % (ref 0.0–0.2)

## 2023-12-09 LAB — COMPREHENSIVE METABOLIC PANEL WITH GFR
ALT: 15 U/L (ref 0–44)
AST: 28 U/L (ref 15–41)
Albumin: 4.5 g/dL (ref 3.5–5.0)
Alkaline Phosphatase: 75 U/L (ref 38–126)
Anion gap: 13 (ref 5–15)
BUN: 16 mg/dL (ref 6–20)
CO2: 24 mmol/L (ref 22–32)
Calcium: 9.4 mg/dL (ref 8.9–10.3)
Chloride: 103 mmol/L (ref 98–111)
Creatinine, Ser: 1.06 mg/dL (ref 0.61–1.24)
GFR, Estimated: >60 mL/min (ref >60)
Glucose, Bld: 160 mg/dL — ABNORMAL HIGH (ref 70–99)
Potassium: 3.3 mmol/L — ABNORMAL LOW (ref 3.5–5.1)
Sodium: 140 mmol/L (ref 135–145)
Total Bilirubin: 0.9 mg/dL (ref 0.0–1.2)
Total Protein: 8.6 g/dL — ABNORMAL HIGH (ref 6.5–8.1)

## 2023-12-09 LAB — LIPASE, BLOOD: Lipase: 26 U/L (ref 11–51)

## 2023-12-09 MED ORDER — ONDANSETRON HCL 4 MG/2ML IJ SOLN
4.0000 mg | Freq: Once | INTRAMUSCULAR | Status: AC
  Administered 2023-12-09: 4 mg via INTRAVENOUS
  Filled 2023-12-09: qty 2

## 2023-12-09 MED ORDER — SODIUM CHLORIDE 0.9 % IV BOLUS
1000.0000 mL | Freq: Once | INTRAVENOUS | Status: AC
  Administered 2023-12-09: 1000 mL via INTRAVENOUS

## 2023-12-09 MED ORDER — DROPERIDOL 2.5 MG/ML IJ SOLN
2.5000 mg | Freq: Once | INTRAMUSCULAR | Status: AC
  Administered 2023-12-09: 2.5 mg via INTRAVENOUS
  Filled 2023-12-09: qty 2

## 2023-12-09 MED ORDER — ONDANSETRON 4 MG PO TBDP: 4.0000 mg | ORAL_TABLET | Freq: Three times a day (TID) | ORAL | 0 refills | Status: DC | PRN

## 2023-12-09 MED ORDER — IOHEXOL 300 MG/ML  SOLN
100.0000 mL | Freq: Once | INTRAMUSCULAR | Status: AC | PRN
  Administered 2023-12-09: 100 mL via INTRAVENOUS

## 2023-12-09 MED ORDER — DROPERIDOL 2.5 MG/ML IJ SOLN: 2.5000 mg | Freq: Once | INTRAMUSCULAR | Status: DC

## 2023-12-09 MED ORDER — MORPHINE SULFATE (PF) 4 MG/ML IV SOLN
4.0000 mg | Freq: Once | INTRAVENOUS | Status: DC
  Filled 2023-12-09: qty 1

## 2023-12-09 NOTE — ED Notes (Signed)
 Pt standing at bedside and will not stay in bed with rails up. Fall precautions initiated (socks and bracelet). Call bell at bedside and pt educated on safety measures. Contract for safety.

## 2023-12-09 NOTE — ED Notes (Signed)
 Patient escorted to CT by this RN

## 2023-12-09 NOTE — ED Notes (Signed)
 Pt back from CT

## 2023-12-09 NOTE — Discharge Instructions (Signed)
 I think that your abdominal pain and vomiting is related to your cannabis use.  Is important that you stop using cannabis for at least 1 year before the symptoms were resolved.  Take Zofran  as needed for nausea and vomiting at home.  Drink plenty of fluids to stay well-hydrated.  Thank you for choosing us  for your health care today!  Please see your primary doctor this week for a follow up appointment.   If you have any new, worsening, or unexpected symptoms call your doctor right away or come back to the emergency department for reevaluation.  It was my pleasure to care for you today.   Arron Large Margery Sheets, MD

## 2023-12-09 NOTE — ED Provider Notes (Addendum)
 [facility] Provider Note    None    (approximate)   History   Emesis   HPI  Jose Mays is a 28 y.o. male   Past medical history of cannabis hyperemesis syndrome who presents to the Emergency Department with 2 days of abdominal pain, nausea and vomiting.  No GI bleeding.  BMs have been normal.  No dysuria frequency.  Still using cannabis.  No other acute medical complaints.    External Medical Documents Reviewed: Hospitalization notes from January 2024 with scrotal abscesses      Physical Exam   Triage Vital Signs: ED Triage Vitals  Encounter Vitals Group     BP 12/09/23 0032 (!) 154/106     Systolic BP Percentile --      Diastolic BP Percentile --      Pulse Rate 12/09/23 0032 74     Resp 12/09/23 0032 (!) 22     Temp 12/09/23 0032 98 F (36.7 C)     Temp Source 12/09/23 0032 Oral     SpO2 12/09/23 0032 100 %     Weight 12/09/23 0033 189 lb 9.5 oz (86 kg)     Height 12/09/23 0033 5\' 6"  (1.676 m)     Head Circumference --      Peak Flow --      Pain Score 12/09/23 0034 10     Pain Loc --      Pain Education --      Exclude from Growth Chart --     Most recent vital signs: Vitals:   12/09/23 0032  BP: (!) 154/106  Pulse: 74  Resp: (!) 22  Temp: 98 F (36.7 C)  SpO2: 100%    General: Awake, no distress.  CV:  Good peripheral perfusion.  Resp:  Normal effort.  Abd:  No distention. Other:  Retching in the room.  Appears uncomfortable.  Hypertensive.  Abdomen exam with tenderness throughout, guarding.   ED Results / Procedures / Treatments   Labs (all labs ordered are listed, but only abnormal results are displayed) Labs Reviewed  CBC WITH DIFFERENTIAL/PLATELET - Abnormal; Notable for the following components:      Result Value   WBC 13.2 (*)    Platelets 408 (*)    Neutro Abs 9.5 (*)    Abs Immature Granulocytes 0.08 (*)    All other components within normal limits  COMPREHENSIVE METABOLIC PANEL WITH GFR -  Abnormal; Notable for the following components:   Potassium 3.3 (*)    Glucose, Bld 160 (*)    Total Protein 8.6 (*)    All other components within normal limits  LIPASE, BLOOD  URINALYSIS, ROUTINE W REFLEX MICROSCOPIC     I ordered and reviewed the above labs they are notable for white blood cell count is high at 13.2  EKG  ED ECG REPORT I, Buell Carmin, the attending physician, personally viewed and interpreted this ECG.   Date: 12/09/2023  EKG Time: 0110  Rate: 83  Rhythm: sinus  Axis: nl  Intervals:normal qtc, long pr  ST&T Change: no stemi    RADIOLOGY I independently reviewed and interpreted CT scan of the abdomen pelvis see no obvious obstructive or inflammatory changes I also reviewed radiologist's formal read.   PROCEDURES:  Critical Care performed: No  Procedures   MEDICATIONS ORDERED IN ED: Medications  morphine  (PF) 4 MG/ML injection 4 mg (4 mg Intravenous Patient Refused/Not Given 12/09/23 0233)  ondansetron  (ZOFRAN ) injection 4 mg (4 mg  Intravenous Given 12/09/23 0046)  droperidol  (INAPSINE ) 2.5 MG/ML injection 2.5 mg (2.5 mg Intravenous Given 12/09/23 0134)  sodium chloride  0.9 % bolus 1,000 mL (0 mLs Intravenous Stopped 12/09/23 0240)  iohexol  (OMNIPAQUE ) 300 MG/ML solution 100 mL (100 mLs Intravenous Contrast Given 12/09/23 0235)     IMPRESSION / MDM / ASSESSMENT AND PLAN / ED COURSE  I reviewed the triage vital signs and the nursing notes.                                Patient's presentation is most consistent with acute presentation with potential threat to life or bodily function.  Differential diagnosis includes, but is not limited to, intra-abdominal infection, obstruction, cannabis hyperemesis syndrome, electrolyte derangements or dehydration   The patient is on the cardiac monitor to evaluate for evidence of arrhythmia and/or significant heart rate changes.  MDM:    History of cannabis hyperemesis syndrome with symptoms consistent with  the same.  Continues to use cannabis.  Given droperidol  IV fluids, morphine  and given his guarding on exam we will get a CAT scan of the abdomen and pelvis to rule out surgical abdominal pathologies which fortunately looks normal except for scattered hemangiomas of the liver.  Will reassess for symptom improvement and disposition planning.  -- Workup unremarkable, feeling better, resting comfortably, no vomiting, plan for discharge.     FINAL CLINICAL IMPRESSION(S) / ED DIAGNOSES   Final diagnoses:  Cyclic vomiting syndrome  Generalized abdominal pain     Rx / DC Orders   ED Discharge Orders     None        Note:  This document was prepared using Dragon voice recognition software and may include unintentional dictation errors.    Buell Carmin, MD 12/09/23 1610    Buell Carmin, MD 12/09/23 [phone]

## 2023-12-09 NOTE — ED Triage Notes (Addendum)
 EMS brings pt in from home for c/o N/V/D since 930pm; pt with hx hyperemesis cannabis

## 2023-12-11 ENCOUNTER — Emergency Department (HOSPITAL_COMMUNITY): Payer: MEDICAID

## 2023-12-11 ENCOUNTER — Inpatient Hospital Stay (HOSPITAL_COMMUNITY)
Admission: EM | Admit: 2023-12-11 | Discharge: 2023-12-14 | DRG: 200 | Disposition: A | Discharge status: Home / Self Care | Payer: MEDICAID | Attending: Internal Medicine | Admitting: Internal Medicine

## 2023-12-11 DIAGNOSIS — D72829 Elevated white blood cell count, unspecified: Secondary | ICD-10-CM | POA: Diagnosis present

## 2023-12-11 DIAGNOSIS — E871 Hypo-osmolality and hyponatremia: Secondary | ICD-10-CM | POA: Diagnosis present

## 2023-12-11 DIAGNOSIS — N179 Acute kidney failure, unspecified: Secondary | ICD-10-CM | POA: Diagnosis present

## 2023-12-11 DIAGNOSIS — F1210 Cannabis abuse, uncomplicated: Secondary | ICD-10-CM | POA: Diagnosis present

## 2023-12-11 DIAGNOSIS — E876 Hypokalemia: Principal | ICD-10-CM

## 2023-12-11 DIAGNOSIS — F1290 Cannabis use, unspecified, uncomplicated: Secondary | ICD-10-CM | POA: Diagnosis present

## 2023-12-11 DIAGNOSIS — R112 Nausea with vomiting, unspecified: Secondary | ICD-10-CM | POA: Diagnosis present

## 2023-12-11 DIAGNOSIS — Z7151 Drug abuse counseling and surveillance of drug abuser: Secondary | ICD-10-CM

## 2023-12-11 DIAGNOSIS — E872 Acidosis, unspecified: Secondary | ICD-10-CM | POA: Diagnosis present

## 2023-12-11 DIAGNOSIS — F17210 Nicotine dependence, cigarettes, uncomplicated: Secondary | ICD-10-CM | POA: Diagnosis present

## 2023-12-11 DIAGNOSIS — J982 Interstitial emphysema: Principal | ICD-10-CM

## 2023-12-11 DIAGNOSIS — E860 Dehydration: Secondary | ICD-10-CM

## 2023-12-11 HISTORY — DX: Interstitial emphysema: J98.2

## 2023-12-11 LAB — RAPID URINE DRUG SCREEN, HOSP PERFORMED
Amphetamines: NOT DETECTED
Barbiturates: NOT DETECTED
Benzodiazepines: NOT DETECTED
Cocaine: NOT DETECTED
Opiates: POSITIVE — AB
Tetrahydrocannabinol: POSITIVE — AB

## 2023-12-11 LAB — URINALYSIS, ROUTINE W REFLEX MICROSCOPIC
Bacteria, UA: NONE SEEN
Bilirubin Urine: NEGATIVE
Glucose, UA: NEGATIVE mg/dL
Hgb urine dipstick: NEGATIVE
Ketones, ur: 20 mg/dL — AB
Leukocytes,Ua: NEGATIVE
Nitrite: NEGATIVE
Protein, ur: 30 mg/dL — AB
Specific Gravity, Urine: >1.046 — ABNORMAL HIGH (ref 1.005–1.030)
pH: 5 (ref 5.0–8.0)

## 2023-12-11 LAB — COMPREHENSIVE METABOLIC PANEL WITH GFR
ALT: 33 U/L (ref 0–44)
AST: 79 U/L — ABNORMAL HIGH (ref 15–41)
Albumin: 4.9 g/dL (ref 3.5–5.0)
Alkaline Phosphatase: 87 U/L (ref 38–126)
Anion gap: 18 — ABNORMAL HIGH (ref 5–15)
BUN: 16 mg/dL (ref 6–20)
CO2: 21 mmol/L — ABNORMAL LOW (ref 22–32)
Calcium: 10.1 mg/dL (ref 8.9–10.3)
Chloride: 97 mmol/L — ABNORMAL LOW (ref 98–111)
Creatinine, Ser: 1.89 mg/dL — ABNORMAL HIGH (ref 0.61–1.24)
GFR, Estimated: 49 mL/min — ABNORMAL LOW (ref >60)
Glucose, Bld: 125 mg/dL — ABNORMAL HIGH (ref 70–99)
Potassium: 2.9 mmol/L — ABNORMAL LOW (ref 3.5–5.1)
Sodium: 136 mmol/L (ref 135–145)
Total Bilirubin: 1.6 mg/dL — ABNORMAL HIGH (ref 0.0–1.2)
Total Protein: 9.4 g/dL — ABNORMAL HIGH (ref 6.5–8.1)

## 2023-12-11 LAB — CBC WITH DIFFERENTIAL/PLATELET
Abs Immature Granulocytes: 0.07 10*3/uL (ref 0.00–0.07)
Basophils Absolute: 0 10*3/uL (ref 0.0–0.1)
Basophils Relative: 0 %
Eosinophils Absolute: 0 10*3/uL (ref 0.0–0.5)
Eosinophils Relative: 0 %
HCT: 48.9 % (ref 39.0–52.0)
Hemoglobin: 17.2 g/dL — ABNORMAL HIGH (ref 13.0–17.0)
Immature Granulocytes: 1 %
Lymphocytes Relative: 21 %
Lymphs Abs: 2.4 10*3/uL (ref 0.7–4.0)
MCH: 31.4 pg (ref 26.0–34.0)
MCHC: 35.2 g/dL (ref 30.0–36.0)
MCV: 89.2 fL (ref 80.0–100.0)
Monocytes Absolute: 1.4 10*3/uL — ABNORMAL HIGH (ref 0.1–1.0)
Monocytes Relative: 12 %
Neutro Abs: 7.6 10*3/uL (ref 1.7–7.7)
Neutrophils Relative %: 66 %
Platelets: 461 10*3/uL — ABNORMAL HIGH (ref 150–400)
RBC: 5.48 MIL/uL (ref 4.22–5.81)
RDW: 11.9 % (ref 11.5–15.5)
WBC: 11.5 10*3/uL — ABNORMAL HIGH (ref 4.0–10.5)
nRBC: 0 % (ref 0.0–0.2)

## 2023-12-11 LAB — LIPASE, BLOOD: Lipase: 25 U/L (ref 11–51)

## 2023-12-11 MED ORDER — PIPERACILLIN-TAZOBACTAM 3.375 G IVPB 30 MIN
3.3750 g | Freq: Once | INTRAVENOUS | Status: AC
  Administered 2023-12-11: 3.375 g via INTRAVENOUS
  Filled 2023-12-11: qty 50

## 2023-12-11 MED ORDER — POTASSIUM CHLORIDE 10 MEQ/100ML IV SOLN
10.0000 meq | INTRAVENOUS | Status: AC
  Administered 2023-12-11 – 2023-12-12 (×3): 10 meq via INTRAVENOUS
  Filled 2023-12-11 (×3): qty 100

## 2023-12-11 MED ORDER — MORPHINE SULFATE (PF) 2 MG/ML IV SOLN
4.0000 mg | Freq: Once | INTRAVENOUS | Status: AC
  Administered 2023-12-11: 4 mg via INTRAVENOUS
  Filled 2023-12-11: qty 2

## 2023-12-11 MED ORDER — IOHEXOL 350 MG/ML SOLN
75.0000 mL | Freq: Once | INTRAVENOUS | Status: AC | PRN
  Administered 2023-12-11: 75 mL via INTRAVENOUS

## 2023-12-11 MED ORDER — PROCHLORPERAZINE EDISYLATE 10 MG/2ML IJ SOLN
10.0000 mg | Freq: Once | INTRAMUSCULAR | Status: AC
  Administered 2023-12-11: 10 mg via INTRAVENOUS
  Filled 2023-12-11: qty 2

## 2023-12-11 MED ORDER — PIPERACILLIN-TAZOBACTAM 3.375 G IVPB
3.3750 g | Freq: Three times a day (TID) | INTRAVENOUS | Status: DC
  Administered 2023-12-12 – 2023-12-13 (×3): 3.375 g via INTRAVENOUS
  Filled 2023-12-11 (×3): qty 50

## 2023-12-11 MED ORDER — SODIUM CHLORIDE 0.9 % IV BOLUS
1000.0000 mL | Freq: Once | INTRAVENOUS | Status: AC
  Administered 2023-12-11: 1000 mL via INTRAVENOUS

## 2023-12-11 NOTE — ED Notes (Signed)
 Approximately four days ago pt has chicken [place], wings and fries from [employer] and began vomiting "2 minutes" after completing the meal. Pt has experienced multiple episodes of vomiting in the days following. Pt has only consumed water and applesauce over the past four days.

## 2023-12-11 NOTE — ED Provider Triage Note (Signed)
 Emergency Medicine Provider Triage Evaluation Note  Aymaan Gunsallus , a 28 y.o. male  was evaluated in triage.  Pt complains of severe abd pain nausea and vomiting. Seen 2 days ago dx with cannabinoid hyperemesis/ No stating that he has brown vomit that smells of feces. Unable to make a bm. He is passing gas.   Review of Systems  Positive: vomiting Negative: fever  Physical Exam  BP (!) 135/91 (BP Location: Right Arm)   Pulse (!) 104   Temp (!) 97.4 F (36.3 C)   Resp 20   SpO2 99%  Gen:   Awake, distressed Resp:  Normal effort  MSK:   Moves extremities without difficulty  Other:  Unable to sit still , diaphoretic,   Medical Decision Making  Medically screening exam initiated at 5:28 PM.  Appropriate orders placed.  Reinhard Sirak was informed that the remainder of the evaluation will be completed by another provider, this initial triage assessment does not replace that evaluation, and the importance of remaining in the ED until their evaluation is complete.     Tama Fails, PA-C 12/11/23 1731

## 2023-12-11 NOTE — ED Notes (Signed)
Dr. Hall at bedside.

## 2023-12-11 NOTE — ED Notes (Signed)
 Patient transported to CT

## 2023-12-11 NOTE — ED Triage Notes (Signed)
 Pt states that he has been having lower abd pain for four days. Pt reports no BM in that time. Able to flatulate. Pt states that he's had upwards of 30 episodes of emesis today, with several having what appears to be stool in them. Pt denies any cannabis use since being discharged.

## 2023-12-11 NOTE — ED Provider Notes (Signed)
 [place] EMERGENCY DEPARTMENT AT [facility] Provider Note   CSN: [id number] Arrival date & time: 12/11/23  1701     History  Chief Complaint  Patient presents with   Abdominal Pain    Jose Mays is a 28 y.o. male with a history of hyperemesis syndrome presenting to the ED with approximately 4 days of persistent vomiting.  Patient reports has not been able to keep down food.  He is passing gas.  Has not had a bowel movement.  Is having severe abdominal pain, although since triage medications this pain is better  HPI     Home Medications Prior to Admission medications   Medication Sig Start Date End Date Taking? Authorizing Provider  ibuprofen  (ADVIL ) 400 MG tablet Take 1 tablet (400 mg total) by mouth every 6 (six) hours as needed for mild pain (or Fever >/= 101). 08/11/22   Pahwani, Regino Caprio, MD  ondansetron  (ZOFRAN -ODT) 4 MG disintegrating tablet Take 1 tablet (4 mg total) by mouth every 8 (eight) hours as needed for nausea or vomiting. 12/09/23   Buell Carmin, MD      Allergies    Patient has no known allergies.    Review of Systems   Review of Systems  Physical Exam Updated Vital Signs BP 124/88   Pulse 63   Temp 97.8 F (36.6 C) (Oral)   Resp 19   SpO2 100%  Physical Exam Constitutional:      General: He is not in acute distress. HENT:     Head: Normocephalic and atraumatic.  Eyes:     Conjunctiva/sclera: Conjunctivae normal.     Pupils: Pupils are equal, round, and reactive to light.  Cardiovascular:     Rate and Rhythm: Normal rate and regular rhythm.  Pulmonary:     Effort: Pulmonary effort is normal. No respiratory distress.  Abdominal:     General: There is no distension.     Tenderness: There is abdominal tenderness in the epigastric area.  Skin:    General: Skin is warm and dry.  Neurological:     General: No focal deficit present.     Mental Status: He is alert. Mental status is at baseline.  Psychiatric:        Mood and Affect:  Mood normal.        Behavior: Behavior normal.     ED Results / Procedures / Treatments   Labs (all labs ordered are listed, but only abnormal results are displayed) Labs Reviewed  CBC WITH DIFFERENTIAL/PLATELET - Abnormal; Notable for the following components:      Result Value   WBC 11.5 (*)    Hemoglobin 17.2 (*)    Platelets 461 (*)    Monocytes Absolute 1.4 (*)    All other components within normal limits  COMPREHENSIVE METABOLIC PANEL WITH GFR - Abnormal; Notable for the following components:   Potassium 2.9 (*)    Chloride 97 (*)    CO2 21 (*)    Glucose, Bld 125 (*)    Creatinine, Ser 1.89 (*)    Total Protein 9.4 (*)    AST 79 (*)    Total Bilirubin 1.6 (*)    GFR, Estimated 49 (*)    Anion gap 18 (*)    All other components within normal limits  LIPASE, BLOOD  URINALYSIS, ROUTINE W REFLEX MICROSCOPIC  RAPID URINE DRUG SCREEN, HOSP PERFORMED    EKG None  Radiology CT CHEST WO CONTRAST Result Date: 12/11/2023 CLINICAL DATA:  Pneumomediastinum  on earlier abdominal CT EXAM: CT CHEST WITHOUT CONTRAST TECHNIQUE: Multidetector CT imaging of the chest was performed following the standard protocol without IV contrast. RADIATION DOSE REDUCTION: This exam was performed according to the departmental dose-optimization program which includes automated exposure control, adjustment of the mA and/or kV according to patient size and/or use of iterative reconstruction technique. COMPARISON:  12/11/2023, 12/09/2023 FINDINGS: Cardiovascular: Unenhanced imaging of the heart is unremarkable without pericardial effusion. Normal caliber of the thoracic aorta. Assessment of the vascular lumen cannot be performed without intravenous contrast. Mediastinum/Nodes: Pneumomediastinum is again identified, with gas dissecting superiorly into the neck. Thyroid, trachea, and esophagus appear unremarkable. No pathologic adenopathy. Lungs/Pleura: No acute airspace disease, effusion, or pneumothorax.  Central airways are patent. Upper Abdomen: No acute abnormality. Musculoskeletal: No acute or destructive bony abnormalities. Reconstructed images demonstrate no additional findings. IMPRESSION: 1. Extensive pneumomediastinum, with gas dissecting superiorly into the neck. Source of pneumomediastinum is not identified. 2. Otherwise no acute intrathoracic process. No evidence of pneumothorax. Electronically Signed   By: Bobbye Burrow M.D.   On: 12/11/2023 23:13   DG Chest Portable 1 View Result Date: 12/11/2023 CLINICAL DATA:  Pneumomediastinum EXAM: PORTABLE CHEST 1 VIEW COMPARISON:  None Available. FINDINGS: Lungs are clear. No pneumothorax or pleural effusion. Cardiac size within normal limits. Pulmonary vascularity is normal. There is subcutaneous gas within the left cervical tissues at the level of the glottis. No acute bone abnormality. IMPRESSION: 1. Subcutaneous gas within the left cervical tissues at the level of the glottis. Electronically Signed   By: Worthy Heads M.D.   On: 12/11/2023 22:57   CT ABDOMEN PELVIS W CONTRAST Result Date: 12/11/2023 CLINICAL DATA:  Acute nonlocalized abdominal pain EXAM: CT ABDOMEN AND PELVIS WITH CONTRAST TECHNIQUE: Multidetector CT imaging of the abdomen and pelvis was performed using the standard protocol following bolus administration of intravenous contrast. RADIATION DOSE REDUCTION: This exam was performed according to the departmental dose-optimization program which includes automated exposure control, adjustment of the mA and/or kV according to patient size and/or use of iterative reconstruction technique. CONTRAST:  75mL OMNIPAQUE  IOHEXOL  350 MG/ML SOLN COMPARISON:  12/09/2023, 05/23/2016 FINDINGS: Lower chest: There is pneumomediastinum present within the visualized lower thorax interdigitating between the descending thoracic aorta and distal esophagus. No pleural effusion identified within visualized lung bases. Cardiac size within normal limits.  Hepatobiliary: 2 cm peripheral enhancing lesion within the right hepatic lobe, axial image # 8/3, is not well assessed on this single phase examination but may represent a flash fill hemangioma or area of vascular shunting. This appears stable since immediate prior examination. Scattered tiny hepatic cysts are identified. Liver otherwise unremarkable. Gallbladder unremarkable. No intra or extrahepatic biliary ductal dilation. Pancreas: Unremarkable Spleen: Unremarkable Adrenals/Urinary Tract: Adrenal glands are unremarkable. Kidneys are normal, without renal calculi, focal lesion, or hydronephrosis. Bladder is unremarkable. Stomach/Bowel: Stomach is within normal limits. Appendix appears normal. No evidence of bowel wall thickening, distention, or inflammatory changes. Vascular/Lymphatic: No significant vascular findings are present. No enlarged abdominal or pelvic lymph nodes. Reproductive: Prostate is unremarkable. Other: No abdominal wall hernia or abnormality. No abdominopelvic ascites. Musculoskeletal: No acute or significant osseous findings. IMPRESSION: 1. Pneumomediastinum within the visualized lower thorax. No pneumothorax or pleural effusion identified within the visualized lung bases. Dedicated CT imaging of the chest may be helpful for further evaluation. 2. No acute intra-abdominal or pelvic pathology identified. 3. 2 cm peripheral enhancing lesion within the right hepatic lobe, not well assessed on this single phase examination but may represent a  flash fill hemangioma or area of vascular shunting. If indicated, this can be confirmed liver protocol CT or MRI examination Electronically Signed   By: Worthy Heads M.D.   On: 12/11/2023 21:34    Procedures Procedures    Medications Ordered in ED Medications  potassium chloride  10 mEq in 100 mL IVPB (10 mEq Intravenous New Bag/Given 12/11/23 2325)  piperacillin-tazobactam (ZOSYN) IVPB 3.375 g (3.375 g Intravenous New Bag/Given 12/11/23 2323)     Followed by  piperacillin-tazobactam (ZOSYN) IVPB 3.375 g (has no administration in time range)  morphine  (PF) 2 MG/ML injection 4 mg (4 mg Intravenous Given 12/11/23 1736)  prochlorperazine (COMPAZINE) injection 10 mg (10 mg Intravenous Given 12/11/23 1734)  iohexol  (OMNIPAQUE ) 350 MG/ML injection 75 mL (75 mLs Intravenous Contrast Given 12/11/23 2041)  sodium chloride  0.9 % bolus 1,000 mL (1,000 mLs Intravenous New Bag/Given 12/11/23 2324)    ED Course/ Medical Decision Making/ A&P Clinical Course as of 12/11/23 2340  Thu Dec 11, 2023  2158 I received a call from Radiology, Dr. Jackquelyn Mass, Deatra Face has pneumomediastinum on CT abd/pv. Needs dedicated chest ct. Ct non-con ordered due to recent contrast load , AKI. Charge nurse informed patient needs next available room. [AH]  2241 Dr Deloise Ferries - recommending AM esophogram, can advance to oral diet tomorrow if non-emergent; monitor for infection symptoms overnight, but as pt is stable this can be medically managed. [MT]  2337 Admitted to hospitalist Dr Del Favia in stable condition [MT]    Clinical Course User Index [AH] Harris, Abigail, PA-C [MT] Gordon Latus Janalyn Me, MD                                 Medical Decision Making Amount and/or Complexity of Data Reviewed Radiology: ordered.  Risk Prescription drug management. Decision regarding hospitalization.   This patient presents to the ED with concern for nausea, vomiting, abdominal pain. This involves an extensive number of treatment options, and is a complaint that carries with it a high risk of complications and morbidity.  The differential diagnosis includes gastritis versus pancreatitis versus biliary disease versus esophagitis versus other  Co-morbidities that complicate the patient evaluation: cannabis use  I ordered and personally interpreted labs.  The pertinent results include: White blood cell count 11.5.  Hemoglobin 17.2, likely concentrated, platelets also elevated 451.  Patient has a  small anion gap of 18 with hypokalemia, potassium 2.9, developing AKI with creatinine 1.8.  I ordered imaging studies including CT abdomen pelvis with contrast I independently visualized and interpreted imaging which showed hemangioma liver, concern for pneumomediastinum of the lower esophagus is visualized on CT abdomen pelvis I agree with the radiologist interpretation  CT chest ordered to better visualize this issue.  I reviewed interpreted this imaging and he has visualized pneumomediastinum on CT chest.  Patient was given IV morphine  and Compazine in triage with significant improvement of his GI upset.  He will be given a dose of Protonix  and fluid bolus for hydration as well as potassium for hypokalemia here.  I have reviewed the patients home medicines and have made adjustments as needed  Test Considered: Low suspicion for acute PE  I requested consultation with the CT surgeon by phone,  and discussed lab and imaging findings as well as pertinent plan - they recommend: see ed course - Dr Deloise Ferries  After the interventions noted above, I reevaluated the patient and found that they have: improved -nausea and vomiting is  significantly improved.  Vital signs normal   Disposition:  After consideration of the diagnostic results and the patients response to treatment, I feel that the patient would benefit from medical admission.         Final Clinical Impression(s) / ED Diagnoses Final diagnoses:  Hypokalemia  Pneumomediastinum (HCC)  Dehydration    Rx / DC Orders ED Discharge Orders     None         Arvilla Birmingham, MD 12/11/23 2340

## 2023-12-11 NOTE — H&P (Incomplete)
 History and Physical  Jose Mays LKG:[id number] DOB: [birth year] DOA: 12/11/2023  Referring physician: Dr. Gordon Latus, EDP  PCP: Pcp, No  Outpatient Specialists: None Patient coming from: Home  Chief Complaint: Vomiting and abdominal pain  HPI: Jose Mays is a 28 y.o. male with medical history significant for THC abuse, hyperemesis cannabis, who presents to the ER with 4 days of nausea vomiting and intermittent diarrhea.  Associated with worsening abdominal pain.  Denies sick contacts.  Denies subjective fevers or chills.  In the ER, initially tachycardic, a chest x-ray revealed subcutaneous gas within the left cervical tissues at the level of the glottis.  He subsequently had a CT chest without contrast that revealed extensive pneumomediastinum with gas dissecting superiorly into the neck.  No evidence of pneumothorax.    The patient received IV antiemetics, IV opiate-based analgesics, IV antibiotics, and IV fluid in the ER.  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  CTS will see in consultation.  Admitted by [facility], hospitalist service.  ED Course: Temperature 99.9.  BP 103/65, pulse 55, respiratory 16, O2 saturation 100% on room air.  Review of Systems: Review of systems as noted in the HPI. All other systems reviewed and are negative.   Past Medical History:  Diagnosis Date   Abscess    Cannabinoid hyperemesis syndrome    Past Surgical History:  Procedure Laterality Date   INCISION AND DRAINAGE ABSCESS N/A 08/11/2022   Procedure: INCISION AND DRAINAGE ABSCESS;  Surgeon: Dustin Gimenez, MD;  Location: [facility] ORS;  Service: Urology;  Laterality: N/A;   TONSILLECTOMY      Social History:  reports that he has been smoking. He has never used smokeless tobacco. He reports current drug use. Drug: Marijuana. He reports that he does not drink  alcohol.   No Known Allergies  Family History  Problem Relation Age of Onset   Prostate cancer Paternal Uncle    Rheum arthritis Paternal Grandmother    Bladder Cancer Neg Hx       Prior to Admission medications   Medication Sig Start Date End Date Taking? Authorizing Provider  ibuprofen  (ADVIL ) 400 MG tablet Take 1 tablet (400 mg total) by mouth every 6 (six) hours as needed for mild pain (or Fever >/= 101). 08/11/22   Pahwani, Regino Caprio, MD  ondansetron  (ZOFRAN -ODT) 4 MG disintegrating tablet Take 1 tablet (4 mg total) by mouth every 8 (eight) hours as needed for nausea or vomiting. 12/09/23   Buell Carmin, MD    Physical Exam: BP 124/88   Pulse 63   Temp 97.8 F (36.6 C) (Oral)   Resp 19   SpO2 100%   General: 28 y.o. year-old male well developed well nourished in no acute distress.  Alert and oriented x3. Cardiovascular: Regular rate and rhythm with no rubs or gallops.  No thyromegaly or JVD noted.  No lower extremity edema. 2/4 pulses in all 4 extremities. Respiratory: Clear to auscultation with no wheezes or rales. Good inspiratory effort. Abdomen: Soft nontender nondistended with normal bowel sounds x4 quadrants. Muskuloskeletal: No cyanosis, clubbing or edema noted bilaterally Neuro: CN II-XII intact, strength, sensation, reflexes Skin: No ulcerative lesions noted or rashes Psychiatry: Judgement and insight appear normal. Mood is appropriate for condition and setting          Labs on Admission:  Basic Metabolic Panel: Recent Labs  Lab 12/09/23 0045 12/11/23 1725  NA 140 136  K 3.3* 2.9*  CL 103 97*  CO2 24 21*  GLUCOSE 160* 125*  BUN 16 16  CREATININE 1.06 1.89*  CALCIUM 9.4 10.1   Liver Function Tests: Recent Labs  Lab 12/09/23 0045 12/11/23 1725  AST 28 79*  ALT 15 33  ALKPHOS 75 87  BILITOT 0.9 1.6*  PROT 8.6* 9.4*  ALBUMIN 4.5 4.9   Recent Labs  Lab 12/09/23 0045 12/11/23 1725  LIPASE 26 25   No results for input(s): "AMMONIA" in the last  168 hours. CBC: Recent Labs  Lab 12/09/23 0045 12/11/23 1725  WBC 13.2* 11.5*  NEUTROABS 9.5* 7.6  HGB 15.1 17.2*  HCT 45.2 48.9  MCV 94.6 89.2  PLT 408* 461*   Cardiac Enzymes: No results for input(s): "CKTOTAL", "CKMB", "CKMBINDEX", "TROPONINI" in the last 168 hours.  BNP (last 3 results) No results for input(s): "BNP" in the last 8760 hours.  ProBNP (last 3 results) No results for input(s): "PROBNP" in the last 8760 hours.  CBG: No results for input(s): "GLUCAP" in the last 168 hours.  Radiological Exams on Admission: CT CHEST WO CONTRAST Result Date: 12/11/2023 CLINICAL DATA:  Pneumomediastinum on earlier abdominal CT EXAM: CT CHEST WITHOUT CONTRAST TECHNIQUE: Multidetector CT imaging of the chest was performed following the standard protocol without IV contrast. RADIATION DOSE REDUCTION: This exam was performed according to the departmental dose-optimization program which includes automated exposure control, adjustment of the mA and/or kV according to patient size and/or use of iterative reconstruction technique. COMPARISON:  12/11/2023, 12/09/2023 FINDINGS: Cardiovascular: Unenhanced imaging of the heart is unremarkable without pericardial effusion. Normal caliber of the thoracic aorta. Assessment of the vascular lumen cannot be performed without intravenous contrast. Mediastinum/Nodes: Pneumomediastinum is again identified, with gas dissecting superiorly into the neck. Thyroid, trachea, and esophagus appear unremarkable. No pathologic adenopathy. Lungs/Pleura: No acute airspace disease, effusion, or pneumothorax. Central airways are patent. Upper Abdomen: No acute abnormality. Musculoskeletal: No acute or destructive bony abnormalities. Reconstructed images demonstrate no additional findings. IMPRESSION: 1. Extensive pneumomediastinum, with gas dissecting superiorly into the neck. Source of pneumomediastinum is not identified. 2. Otherwise no acute intrathoracic process. No evidence  of pneumothorax. Electronically Signed   By: Bobbye Burrow M.D.   On: 12/11/2023 23:13   DG Chest Portable 1 View Result Date: 12/11/2023 CLINICAL DATA:  Pneumomediastinum EXAM: PORTABLE CHEST 1 VIEW COMPARISON:  None Available. FINDINGS: Lungs are clear. No pneumothorax or pleural effusion. Cardiac size within normal limits. Pulmonary vascularity is normal. There is subcutaneous gas within the left cervical tissues at the level of the glottis. No acute bone abnormality. IMPRESSION: 1. Subcutaneous gas within the left cervical tissues at the level of the glottis. Electronically Signed   By: Worthy Heads M.D.   On: 12/11/2023 22:57   CT ABDOMEN PELVIS W CONTRAST Result Date: 12/11/2023 CLINICAL DATA:  Acute nonlocalized abdominal pain EXAM: CT ABDOMEN AND PELVIS WITH CONTRAST TECHNIQUE: Multidetector CT imaging of the abdomen and pelvis was performed using the standard protocol following bolus administration of intravenous contrast. RADIATION DOSE REDUCTION: This exam was performed according to the departmental dose-optimization program which includes automated exposure control, adjustment of the mA and/or kV according to patient size and/or use of iterative reconstruction technique. CONTRAST:  75mL OMNIPAQUE  IOHEXOL  350 MG/ML SOLN COMPARISON:  12/09/2023, 05/23/2016 FINDINGS: Lower chest: There is pneumomediastinum present within the visualized lower thorax interdigitating between the descending thoracic aorta and distal esophagus. No pleural effusion identified within visualized lung bases. Cardiac size within normal limits. Hepatobiliary: 2  cm peripheral enhancing lesion within the right hepatic lobe, axial image # 8/3, is not well assessed on this single phase examination but may represent a flash fill hemangioma or area of vascular shunting. This appears stable since immediate prior examination. Scattered tiny hepatic cysts are identified. Liver otherwise unremarkable. Gallbladder unremarkable. No intra  or extrahepatic biliary ductal dilation. Pancreas: Unremarkable Spleen: Unremarkable Adrenals/Urinary Tract: Adrenal glands are unremarkable. Kidneys are normal, without renal calculi, focal lesion, or hydronephrosis. Bladder is unremarkable. Stomach/Bowel: Stomach is within normal limits. Appendix appears normal. No evidence of bowel wall thickening, distention, or inflammatory changes. Vascular/Lymphatic: No significant vascular findings are present. No enlarged abdominal or pelvic lymph nodes. Reproductive: Prostate is unremarkable. Other: No abdominal wall hernia or abnormality. No abdominopelvic ascites. Musculoskeletal: No acute or significant osseous findings. IMPRESSION: 1. Pneumomediastinum within the visualized lower thorax. No pneumothorax or pleural effusion identified within the visualized lung bases. Dedicated CT imaging of the chest may be helpful for further evaluation. 2. No acute intra-abdominal or pelvic pathology identified. 3. 2 cm peripheral enhancing lesion within the right hepatic lobe, not well assessed on this single phase examination but may represent a flash fill hemangioma or area of vascular shunting. If indicated, this can be confirmed liver protocol CT or MRI examination Electronically Signed   By: Worthy Heads M.D.   On: 12/11/2023 21:34    EKG: I independently viewed the EKG done and my findings are as followed: None available at the time of this visit.  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 Follow esophagram in the morning IV antiemetics as needed IV analgesics as needed CTS will see in consultation, consulted by EDP  Hypokalemia Serum potassium 2.9 Repleted intravenously Check magnesium  level  High anion gap metabolic acidosis Serum  bicarb 21, anion gap 18 Continue to treat underlying condition Repeat CMP in the morning  AKI in the setting of dehydration from nausea vomiting and diarrhea GI losses Continue IV fluid Continue supportive care Monitor urine output Repeat CMP  Elevated liver chemistries AST to ALT ratio at least 2-1 However the patient denies use of alcohol T. bili Ruben 1.6 Avoid hepatotoxic agents Repeat CMP in the morning  Cannabis hyperemesis Recommend complete THC cessation As needed IV antiemetics TOC consulted to provide resources for [facility] cessation  THC abuse Management stated above   Critical care time: 65 minutes.   DVT prophylaxis: SCDs  Code Status: Full code.  Family Communication: None at bedside.  Disposition Plan: Admitted to progressive care unit.  Consults called: Cardiothoracic surgery, Dr. Deloise Ferries, consulted by EDP, please contact CTS once the esophagram results.  Admission status: Inpatient status.   Status is: Inpatient The patient requires at least 2 midnights for further evaluation and treatment of present condition.   Bary Boss MD [facility] Pager [phone]  If 7PM-7AM, please contact night-coverage [email or web address] Password [facility]  12/11/2023, 11:38 PM

## 2023-12-12 ENCOUNTER — Encounter (HOSPITAL_COMMUNITY): Payer: Self-pay | Admitting: Internal Medicine

## 2023-12-12 ENCOUNTER — Other Ambulatory Visit: Payer: Self-pay

## 2023-12-12 ENCOUNTER — Inpatient Hospital Stay (HOSPITAL_COMMUNITY): Payer: MEDICAID

## 2023-12-12 DIAGNOSIS — K223 Perforation of esophagus: Secondary | ICD-10-CM

## 2023-12-12 LAB — CBC
HCT: 45.3 % (ref 39.0–52.0)
Hemoglobin: 15.7 g/dL (ref 13.0–17.0)
MCH: 32.4 pg (ref 26.0–34.0)
MCHC: 34.7 g/dL (ref 30.0–36.0)
MCV: 93.4 fL (ref 80.0–100.0)
Platelets: 375 10*3/uL (ref 150–400)
RBC: 4.85 MIL/uL (ref 4.22–5.81)
RDW: 12.3 % (ref 11.5–15.5)
WBC: 12.3 10*3/uL — ABNORMAL HIGH (ref 4.0–10.5)
nRBC: 0 % (ref 0.0–0.2)

## 2023-12-12 LAB — COMPREHENSIVE METABOLIC PANEL WITH GFR
ALT: 31 U/L (ref 0–44)
AST: 63 U/L — ABNORMAL HIGH (ref 15–41)
Albumin: 4 g/dL (ref 3.5–5.0)
Alkaline Phosphatase: 74 U/L (ref 38–126)
Anion gap: 11 (ref 5–15)
BUN: 14 mg/dL (ref 6–20)
CO2: 25 mmol/L (ref 22–32)
Calcium: 8.7 mg/dL — ABNORMAL LOW (ref 8.9–10.3)
Chloride: 98 mmol/L (ref 98–111)
Creatinine, Ser: 1.21 mg/dL (ref 0.61–1.24)
GFR, Estimated: >60 mL/min (ref >60)
Glucose, Bld: 87 mg/dL (ref 70–99)
Potassium: 2.9 mmol/L — ABNORMAL LOW (ref 3.5–5.1)
Sodium: 134 mmol/L — ABNORMAL LOW (ref 135–145)
Total Bilirubin: 1.5 mg/dL — ABNORMAL HIGH (ref 0.0–1.2)
Total Protein: 7.6 g/dL (ref 6.5–8.1)

## 2023-12-12 LAB — MAGNESIUM: Magnesium: 2 mg/dL (ref 1.7–2.4)

## 2023-12-12 LAB — PHOSPHORUS: Phosphorus: 3.9 mg/dL (ref 2.5–4.6)

## 2023-12-12 MED ORDER — POTASSIUM CHLORIDE 10 MEQ/100ML IV SOLN
10.0000 meq | Freq: Once | INTRAVENOUS | Status: AC
Start: 2023-12-12 — End: 2023-12-12
  Administered 2023-12-12: 10 meq via INTRAVENOUS
  Filled 2023-12-12: qty 100

## 2023-12-12 MED ORDER — POTASSIUM CHLORIDE 10 MEQ/100ML IV SOLN
10.0000 meq | INTRAVENOUS | Status: DC
  Administered 2023-12-12: 10 meq via INTRAVENOUS
  Filled 2023-12-12 (×2): qty 100

## 2023-12-12 MED ORDER — POTASSIUM CHLORIDE 10 MEQ/100ML IV SOLN
10.0000 meq | Freq: Once | INTRAVENOUS | Status: AC
  Administered 2023-12-12: 10 meq via INTRAVENOUS

## 2023-12-12 MED ORDER — POTASSIUM CHLORIDE 2 MEQ/ML IV SOLN
INTRAVENOUS | Status: DC
  Filled 2023-12-12: qty 1000

## 2023-12-12 MED ORDER — HYDROMORPHONE HCL 1 MG/ML IJ SOLN: 0.5000 mg | INTRAMUSCULAR | Status: DC | PRN

## 2023-12-12 MED ORDER — PANTOPRAZOLE SODIUM 40 MG IV SOLR
40.0000 mg | Freq: Every day | INTRAVENOUS | Status: DC
  Administered 2023-12-12 – 2023-12-13 (×2): 40 mg via INTRAVENOUS
  Filled 2023-12-12 (×2): qty 10

## 2023-12-12 MED ORDER — POTASSIUM CHLORIDE 10 MEQ/100ML IV SOLN
10.0000 meq | Freq: Once | INTRAVENOUS | Status: AC
  Administered 2023-12-12: 10 meq via INTRAVENOUS
  Filled 2023-12-12: qty 100

## 2023-12-12 MED ORDER — PROCHLORPERAZINE EDISYLATE 10 MG/2ML IJ SOLN: 5.0000 mg | Freq: Four times a day (QID) | INTRAMUSCULAR | Status: DC | PRN

## 2023-12-12 MED ORDER — SODIUM CHLORIDE 0.9 % IV SOLN: Freq: Once | INTRAVENOUS | Status: AC

## 2023-12-12 MED ORDER — POTASSIUM CHLORIDE 10 MEQ/100ML IV SOLN: 10.0000 meq | INTRAVENOUS | Status: DC

## 2023-12-12 MED ORDER — MORPHINE SULFATE (PF) 2 MG/ML IV SOLN: 2.0000 mg | INTRAVENOUS | Status: DC | PRN

## 2023-12-12 MED ORDER — IOHEXOL 300 MG/ML  SOLN
100.0000 mL | Freq: Once | INTRAMUSCULAR | Status: AC | PRN
  Administered 2023-12-12: 100 mL via ORAL

## 2023-12-12 NOTE — Progress Notes (Signed)
 PROGRESS NOTE  Jose Mays    DOB: May 24, 1996, 28 y.o.  ZOX:[id number]    Code Status: Full Code   DOA: 12/11/2023   LOS: 1   Brief hospital course  Jose Mays is a 28 y.o. male with medical history significant for THC use, hyperemesis cannabis, who presents to the ER with 4 days of nausea vomiting and intermittent diarrhea.     In the ER, initially tachycardic, a chest x-ray revealed subcutaneous gas within the left cervical tissues at the level of the glottis.  He subsequently had a CT chest without contrast that revealed extensive pneumomediastinum with gas dissecting superiorly into the neck.  No evidence of pneumothorax.     The patient received IV antiemetics, IV opiate-based analgesics, IV antibiotics, and IV fluid in the ER.   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  CTS will see in consultation.   12/12/23 -vomiting resolved. Awaiting esophagogram and CVTS consult.   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 Follow esophagram IV antiemetics as needed IV analgesics as needed CTS will see in consultation, consulted by EDP Ppx antibiotics ordered PPI   Hypokalemia- replacing IV   AKI in the setting of dehydration from nausea vomiting and diarrhea Resolved   Elevated liver chemistries- improving   Cannabis hyperemesis TOC consulted to provide resources for [facility] cessation  There is no height or weight on file to calculate BMI.  VTE ppx: SCDs Start: 12/12/23 0029  Diet:     Diet   Diet NPO time specified   Consultants: CVTS  Subjective 12/12/23    Pt reports no chest pain. Nausea has resolved, no more vomiting. Awaiting esophagogram and CVTS   Objective   Vitals:   12/12/23 0030 12/12/23 0045 12/12/23 0130  12/12/23 0500  BP: 113/73 114/76 100/67 122/74  Pulse: (!) 56 97 66 (!) 53  Resp:  18 15 17   Temp:  97.9 F (36.6 C) 98 F (36.7 C) 97.9 F (36.6 C)  TempSrc:  Oral Oral Oral  SpO2: 100% 100% 98% 100%   Physical Exam:  General: tired-appearing, NADl Respiratory: normal respiratory effort. Cardiovascular: quick capillary refill, normal S1/S2, RRR, no JVD, murmurs Gastrointestinal: soft, NT, ND Nervous: A&O x3. no gross focal neurologic deficits, normal speech Extremities: moves all equally, no edema, normal tone Skin: dry, intact, normal temperature, normal color. No rashes, lesions or ulcers on exposed skin Psychiatry: normal mood, congruent affect  Labs   I have personally reviewed the following labs and imaging studies CBC    Component Value Date/Time   WBC 12.3 (H) 12/12/2023 0458   RBC 4.85 12/12/2023 0458   HGB 15.7 12/12/2023 0458   HGB 15.7 03/17/2014 0744   HCT 45.3 12/12/2023 0458   HCT 46.4 03/17/2014 0744   PLT 375 12/12/2023 0458   PLT 339 03/17/2014 0744   MCV 93.4 12/12/2023 0458   MCV 94 03/17/2014 0744   MCH 32.4 12/12/2023 0458   MCHC 34.7 12/12/2023 0458   RDW 12.3 12/12/2023 0458   RDW 12.7 03/17/2014 0744   LYMPHSABS 2.4 12/11/2023 1725   MONOABS 1.4 (H) 12/11/2023 1725   EOSABS 0.0 12/11/2023 1725   BASOSABS 0.0 12/11/2023 1725      Latest Ref Rng & Units 12/12/2023    4:58 AM 12/11/2023    5:25 PM 12/09/2023  12:45 AM  BMP  Glucose 70 - 99 mg/dL 87  528  413   BUN 6 - 20 mg/dL 14  16  16    Creatinine 0.61 - 1.24 mg/dL 2.44  0.10  2.72   Sodium 135 - 145 mmol/L 134  136  140   Potassium 3.5 - 5.1 mmol/L 2.9  2.9  3.3   Chloride 98 - 111 mmol/L 98  97  103   CO2 22 - 32 mmol/L 25  21  24    Calcium 8.9 - 10.3 mg/dL 8.7  53.6  9.4     CT CHEST WO CONTRAST Result Date: 12/11/2023 CLINICAL DATA:  Pneumomediastinum on earlier abdominal CT EXAM: CT CHEST WITHOUT CONTRAST TECHNIQUE: Multidetector CT imaging of the chest was performed following  the standard protocol without IV contrast. RADIATION DOSE REDUCTION: This exam was performed according to the departmental dose-optimization program which includes automated exposure control, adjustment of the mA and/or kV according to patient size and/or use of iterative reconstruction technique. COMPARISON:  12/11/2023, 12/09/2023 FINDINGS: Cardiovascular: Unenhanced imaging of the heart is unremarkable without pericardial effusion. Normal caliber of the thoracic aorta. Assessment of the vascular lumen cannot be performed without intravenous contrast. Mediastinum/Nodes: Pneumomediastinum is again identified, with gas dissecting superiorly into the neck. Thyroid, trachea, and esophagus appear unremarkable. No pathologic adenopathy. Lungs/Pleura: No acute airspace disease, effusion, or pneumothorax. Central airways are patent. Upper Abdomen: No acute abnormality. Musculoskeletal: No acute or destructive bony abnormalities. Reconstructed images demonstrate no additional findings. IMPRESSION: 1. Extensive pneumomediastinum, with gas dissecting superiorly into the neck. Source of pneumomediastinum is not identified. 2. Otherwise no acute intrathoracic process. No evidence of pneumothorax. Electronically Signed   By: Bobbye Burrow M.D.   On: 12/11/2023 23:13   DG Chest Portable 1 View Result Date: 12/11/2023 CLINICAL DATA:  Pneumomediastinum EXAM: PORTABLE CHEST 1 VIEW COMPARISON:  None Available. FINDINGS: Lungs are clear. No pneumothorax or pleural effusion. Cardiac size within normal limits. Pulmonary vascularity is normal. There is subcutaneous gas within the left cervical tissues at the level of the glottis. No acute bone abnormality. IMPRESSION: 1. Subcutaneous gas within the left cervical tissues at the level of the glottis. Electronically Signed   By: Worthy Heads M.D.   On: 12/11/2023 22:57   CT ABDOMEN PELVIS W CONTRAST Result Date: 12/11/2023 CLINICAL DATA:  Acute nonlocalized abdominal pain EXAM: CT  ABDOMEN AND PELVIS WITH CONTRAST TECHNIQUE: Multidetector CT imaging of the abdomen and pelvis was performed using the standard protocol following bolus administration of intravenous contrast. RADIATION DOSE REDUCTION: This exam was performed according to the departmental dose-optimization program which includes automated exposure control, adjustment of the mA and/or kV according to patient size and/or use of iterative reconstruction technique. CONTRAST:  75mL OMNIPAQUE  IOHEXOL  350 MG/ML SOLN COMPARISON:  12/09/2023, 05/23/2016 FINDINGS: Lower chest: There is pneumomediastinum present within the visualized lower thorax interdigitating between the descending thoracic aorta and distal esophagus. No pleural effusion identified within visualized lung bases. Cardiac size within normal limits. Hepatobiliary: 2 cm peripheral enhancing lesion within the right hepatic lobe, axial image # 8/3, is not well assessed on this single phase examination but may represent a flash fill hemangioma or area of vascular shunting. This appears stable since immediate prior examination. Scattered tiny hepatic cysts are identified. Liver otherwise unremarkable. Gallbladder unremarkable. No intra or extrahepatic biliary ductal dilation. Pancreas: Unremarkable Spleen: Unremarkable Adrenals/Urinary Tract: Adrenal glands are unremarkable. Kidneys are normal, without renal calculi, focal lesion, or hydronephrosis. Bladder is unremarkable. Stomach/Bowel: Stomach  is within normal limits. Appendix appears normal. No evidence of bowel wall thickening, distention, or inflammatory changes. Vascular/Lymphatic: No significant vascular findings are present. No enlarged abdominal or pelvic lymph nodes. Reproductive: Prostate is unremarkable. Other: No abdominal wall hernia or abnormality. No abdominopelvic ascites. Musculoskeletal: No acute or significant osseous findings. IMPRESSION: 1. Pneumomediastinum within the visualized lower thorax. No pneumothorax  or pleural effusion identified within the visualized lung bases. Dedicated CT imaging of the chest may be helpful for further evaluation. 2. No acute intra-abdominal or pelvic pathology identified. 3. 2 cm peripheral enhancing lesion within the right hepatic lobe, not well assessed on this single phase examination but may represent a flash fill hemangioma or area of vascular shunting. If indicated, this can be confirmed liver protocol CT or MRI examination Electronically Signed   By: Worthy Heads M.D.   On: 12/11/2023 21:34    Disposition Plan & Communication  Patient status: Inpatient  Admitted From: Home Planned disposition location: Home Anticipated discharge date: 5/17 pending completion of workup/treatment  Family Communication: none at bedside    Author: Ree Candy, DO [facility] 12/12/2023, 7:23 AM   Available by Epic secure chat 7AM-7PM. If 7PM-7AM, please contact night-coverage.  [facility] contact information found on [email or web address].

## 2023-12-12 NOTE — Discharge Instructions (Signed)

## 2023-12-12 NOTE — Progress Notes (Signed)
 CSW added substance abuse resources to patient's AVS.  Edwin Dada, MSW, LCSW Transitions of Care  Clinical Social Worker II [phone]

## 2023-12-13 ENCOUNTER — Inpatient Hospital Stay (HOSPITAL_COMMUNITY): Payer: MEDICAID

## 2023-12-13 DIAGNOSIS — R112 Nausea with vomiting, unspecified: Secondary | ICD-10-CM

## 2023-12-13 DIAGNOSIS — E876 Hypokalemia: Secondary | ICD-10-CM

## 2023-12-13 LAB — COMPREHENSIVE METABOLIC PANEL WITH GFR
ALT: 31 U/L (ref 0–44)
AST: 52 U/L — ABNORMAL HIGH (ref 15–41)
Albumin: 3.5 g/dL (ref 3.5–5.0)
Alkaline Phosphatase: 63 U/L (ref 38–126)
Anion gap: 10 (ref 5–15)
BUN: 11 mg/dL (ref 6–20)
CO2: 27 mmol/L (ref 22–32)
Calcium: 8.5 mg/dL — ABNORMAL LOW (ref 8.9–10.3)
Chloride: 99 mmol/L (ref 98–111)
Creatinine, Ser: 0.99 mg/dL (ref 0.61–1.24)
GFR, Estimated: >60 mL/min (ref >60)
Glucose, Bld: 85 mg/dL (ref 70–99)
Potassium: 3.1 mmol/L — ABNORMAL LOW (ref 3.5–5.1)
Sodium: 136 mmol/L (ref 135–145)
Total Bilirubin: 1.2 mg/dL (ref 0.0–1.2)
Total Protein: 7 g/dL (ref 6.5–8.1)

## 2023-12-13 MED ORDER — PANTOPRAZOLE SODIUM 40 MG PO TBEC: 40.0000 mg | DELAYED_RELEASE_TABLET | Freq: Every day | ORAL | Status: DC

## 2023-12-13 MED ORDER — POTASSIUM CHLORIDE CRYS ER 20 MEQ PO TBCR
40.0000 meq | EXTENDED_RELEASE_TABLET | ORAL | Status: AC
  Administered 2023-12-13 (×2): 40 meq via ORAL
  Filled 2023-12-13 (×2): qty 2

## 2023-12-13 NOTE — Assessment & Plan Note (Addendum)
 Esophagogram with no perforation Follow up chest radiograph with no further signs of pneumomediastinum.   Plan to advance diet.  Discontinue antibiotic therapy  No further intervention per CT surgery, likely patient ruptured as small bleb and now has resolved. Reactive leukocytosis

## 2023-12-13 NOTE — Consult Note (Signed)
 Cardiothoracic Surgery Consultation  Reason for Consult: Pneumomediastinum Referring Physician: Dr. Evette Hoes  Jose Mays is an 28 y.o. male.  HPI:   The patient is a 28 year old gentleman with a history of THC abuse and hyperemesis cannabis who presented to the emergency room with a 4-day history of nausea, vomiting, and intermittent diarrhea.  This was associated with increasing abdominal pain.  In the emergency department he was tachycardic.  A chest x-ray showed subcutaneous gas within the left cervical tissues and a CT scan of the chest without contrast showed extensive pneumomediastinum with gas up into the neck.  There was no pneumothorax.  Due to the concern about possible esophageal perforation the emergency department physician discussed case with Dr. Deloise Ferries who recommended keeping the patient n.p.o. with intravenous antibiotics and obtaining an esophagram in the following morning.  This esophagram was performed yesterday and was completely normal with no evidence of perforation.  The patient said that he feels fine now with mild discomfort with swallowing.  His family is here with him.  Past Medical History:  Diagnosis Date   Abscess    Cannabinoid hyperemesis syndrome     Past Surgical History:  Procedure Laterality Date   INCISION AND DRAINAGE ABSCESS N/A 08/11/2022   Procedure: INCISION AND DRAINAGE ABSCESS;  Surgeon: Dustin Gimenez, MD;  Location: [facility] ORS;  Service: Urology;  Laterality: N/A;   TONSILLECTOMY      Family History  Problem Relation Age of Onset   Prostate cancer Paternal Uncle    Rheum arthritis Paternal Grandmother    Bladder Cancer Neg Hx     Social History:  reports that he has been smoking. He has never used smokeless tobacco. He reports current drug use. Drug: Marijuana. He reports that he does not drink alcohol.  Allergies: No Known Allergies  Medications: I have reviewed the patient's current medications. Prior to  Admission:  Medications Prior to Admission  Medication Sig Dispense Refill Last Dose/Taking   Menthol-Camphor (TIGER BALM ARTHRITIS RUB EX) Apply 1 application  topically as needed (Bilateral knee pain.).   Past Week   ondansetron  (ZOFRAN -ODT) 4 MG disintegrating tablet Take 1 tablet (4 mg total) by mouth every 8 (eight) hours as needed for nausea or vomiting. 20 tablet 0 12/11/2023 Morning   Scheduled:  [START ON 12/14/2023] pantoprazole   40 mg Oral Daily   potassium chloride   40 mEq Oral Q4H   Continuous: PRN:prochlorperazine  Anti-infectives (From admission, onward)    Start     Dose/Rate Route Frequency Ordered Stop   12/12/23 0600  piperacillin -tazobactam (ZOSYN ) IVPB 3.375 g  Status:  Discontinued       Placed in "Followed by" Linked Group   3.375 g 12.5 mL/hr over 240 Minutes Intravenous Every 8 hours 12/11/23 2249 12/13/23 0927   12/11/23 2300  piperacillin -tazobactam (ZOSYN ) IVPB 3.375 g       Placed in "Followed by" Linked Group   3.375 g 100 mL/hr over 30 Minutes Intravenous  Once 12/11/23 2249 12/11/23 2353       Results for orders placed or performed during the hospital encounter of 12/11/23 (from the past 48 hours)  CBC with Differential     Status: Abnormal   Collection Time: 12/11/23  5:25 PM  Result Value Ref Range   WBC 11.5 (H) 4.0 - 10.5 K/uL   RBC 5.48 4.22 - 5.81 MIL/uL   Hemoglobin 17.2 (H) 13.0 - 17.0 g/dL   HCT 29.9 37.1 - 69.6 %   MCV  89.2 80.0 - 100.0 fL   MCH 31.4 26.0 - 34.0 pg   MCHC 35.2 30.0 - 36.0 g/dL   RDW 40.9 81.1 - 91.4 %   Platelets 461 (H) 150 - 400 K/uL   nRBC 0.0 0.0 - 0.2 %   Neutrophils Relative % 66 %   Neutro Abs 7.6 1.7 - 7.7 K/uL   Lymphocytes Relative 21 %   Lymphs Abs 2.4 0.7 - 4.0 K/uL   Monocytes Relative 12 %   Monocytes Absolute 1.4 (H) 0.1 - 1.0 K/uL   Eosinophils Relative 0 %   Eosinophils Absolute 0.0 0.0 - 0.5 K/uL   Basophils Relative 0 %   Basophils Absolute 0.0 0.0 - 0.1 K/uL   Immature Granulocytes 1 %    Abs Immature Granulocytes 0.07 0.00 - 0.07 K/uL    Comment: Performed at [facility], [address], [place], [place] [address]  Comprehensive metabolic panel     Status: Abnormal   Collection Time: 12/11/23  5:25 PM  Result Value Ref Range   Sodium 136 135 - 145 mmol/L   Potassium 2.9 (L) 3.5 - 5.1 mmol/L   Chloride 97 (L) 98 - 111 mmol/L   CO2 21 (L) 22 - 32 mmol/L   Glucose, Bld 125 (H) 70 - 99 mg/dL    Comment: Glucose reference range applies only to samples taken after fasting for at least 8 hours.   BUN 16 6 - 20 mg/dL   Creatinine, Ser 6.21 (H) 0.61 - 1.24 mg/dL   Calcium 30.8 8.9 - 65.7 mg/dL   Total Protein 9.4 (H) 6.5 - 8.1 g/dL   Albumin 4.9 3.5 - 5.0 g/dL   AST 79 (H) 15 - 41 U/L   ALT 33 0 - 44 U/L   Alkaline Phosphatase 87 38 - 126 U/L   Total Bilirubin 1.6 (H) 0.0 - 1.2 mg/dL   GFR, Estimated 49 (L) >60 mL/min    Comment: (NOTE) Calculated using the CKD-EPI Creatinine Equation (2021)    Anion gap 18 (H) 5 - 15    Comment: Performed at [facility], [address], [place], [place] [address]  Lipase, blood     Status: None   Collection Time: 12/11/23  5:25 PM  Result Value Ref Range   Lipase 25 11 - 51 U/L    Comment: Performed at [facility], [address], [place], [place] [address]  Urinalysis, Routine w reflex microscopic -Urine, Clean Catch     Status: Abnormal   Collection Time: 12/11/23 11:15 PM  Result Value Ref Range   Color, Urine YELLOW YELLOW   APPearance CLEAR CLEAR   Specific Gravity, Urine >1.046 (H) 1.005 - 1.030   pH 5.0 5.0 - 8.0   Glucose, UA NEGATIVE NEGATIVE mg/dL   Hgb urine dipstick NEGATIVE NEGATIVE   Bilirubin Urine NEGATIVE NEGATIVE   Ketones, ur 20 (A) NEGATIVE mg/dL   Protein, ur 30 (A) NEGATIVE mg/dL   Nitrite NEGATIVE NEGATIVE   Leukocytes,Ua NEGATIVE NEGATIVE   RBC / HPF 0-5 0 - 5 RBC/hpf   WBC, UA 0-5 0 - 5 WBC/hpf   Bacteria, UA NONE SEEN NONE SEEN   Squamous Epithelial / HPF 0-5 0 - 5 /HPF     Comment: Performed at [facility], [address], [place], [place] [address]  Rapid urine drug screen (hospital performed)     Status: Abnormal   Collection Time: 12/11/23 11:15 PM  Result Value Ref Range  Opiates POSITIVE (A) NONE DETECTED   Cocaine NONE DETECTED NONE DETECTED   Benzodiazepines NONE DETECTED NONE DETECTED   Amphetamines NONE DETECTED NONE DETECTED   Tetrahydrocannabinol POSITIVE (A) NONE DETECTED   Barbiturates NONE DETECTED NONE DETECTED    Comment: (NOTE) DRUG SCREEN FOR MEDICAL PURPOSES ONLY.  IF CONFIRMATION IS NEEDED FOR ANY PURPOSE, NOTIFY LAB WITHIN 5 DAYS.  LOWEST DETECTABLE LIMITS FOR URINE DRUG SCREEN Drug Class                     Cutoff (ng/mL) Amphetamine and metabolites    1000 Barbiturate and metabolites    200 Benzodiazepine                 200 Opiates and metabolites        300 Cocaine and metabolites        300 THC                            50 Performed at [facility], [address] [place], [place] [address]   CBC     Status: Abnormal   Collection Time: 12/12/23  4:58 AM  Result Value Ref Range   WBC 12.3 (H) 4.0 - 10.5 K/uL   RBC 4.85 4.22 - 5.81 MIL/uL   Hemoglobin 15.7 13.0 - 17.0 g/dL   HCT 60.4 54.0 - 98.1 %   MCV 93.4 80.0 - 100.0 fL   MCH 32.4 26.0 - 34.0 pg   MCHC 34.7 30.0 - 36.0 g/dL   RDW 19.1 47.8 - 29.5 %   Platelets 375 150 - 400 K/uL   nRBC 0.0 0.0 - 0.2 %    Comment: Performed at [facility], [address], [place], [place] [address]  Comprehensive metabolic panel     Status: Abnormal   Collection Time: 12/12/23  4:58 AM  Result Value Ref Range   Sodium 134 (L) 135 - 145 mmol/L   Potassium 2.9 (L) 3.5 - 5.1 mmol/L   Chloride 98 98 - 111 mmol/L   CO2 25 22 - 32 mmol/L   Glucose, Bld 87 70 - 99 mg/dL    Comment: Glucose reference range applies only to samples taken after fasting for at least 8 hours.   BUN 14 6 - 20 mg/dL   Creatinine, Ser 8.65 0.61 - 1.24 mg/dL   Calcium 8.7 (L) 8.9 -  10.3 mg/dL   Total Protein 7.6 6.5 - 8.1 g/dL   Albumin 4.0 3.5 - 5.0 g/dL   AST 63 (H) 15 - 41 U/L   ALT 31 0 - 44 U/L   Alkaline Phosphatase 74 38 - 126 U/L   Total Bilirubin 1.5 (H) 0.0 - 1.2 mg/dL   GFR, Estimated >78 >46 mL/min    Comment: (NOTE) Calculated using the CKD-EPI Creatinine Equation (2021)    Anion gap 11 5 - 15    Comment: Performed at [facility], [address], [place], [place] [address]  Magnesium      Status: None   Collection Time: 12/12/23  4:58 AM  Result Value Ref Range   Magnesium  2.0 1.7 - 2.4 mg/dL    Comment: Performed at [facility], [address], [place], [place] [address]  Phosphorus     Status: None   Collection Time: 12/12/23  4:58 AM  Result Value Ref Range   Phosphorus 3.9 2.5 - 4.6 mg/dL  Comment: Performed at [facility], [address] [place], [place] [address]  Comprehensive metabolic panel with GFR     Status: Abnormal   Collection Time: 12/13/23  4:13 AM  Result Value Ref Range   Sodium 136 135 - 145 mmol/L   Potassium 3.1 (L) 3.5 - 5.1 mmol/L   Chloride 99 98 - 111 mmol/L   CO2 27 22 - 32 mmol/L   Glucose, Bld 85 70 - 99 mg/dL    Comment: Glucose reference range applies only to samples taken after fasting for at least 8 hours.   BUN 11 6 - 20 mg/dL   Creatinine, Ser 6.04 0.61 - 1.24 mg/dL   Calcium 8.5 (L) 8.9 - 10.3 mg/dL   Total Protein 7.0 6.5 - 8.1 g/dL   Albumin 3.5 3.5 - 5.0 g/dL   AST 52 (H) 15 - 41 U/L   ALT 31 0 - 44 U/L   Alkaline Phosphatase 63 38 - 126 U/L   Total Bilirubin 1.2 0.0 - 1.2 mg/dL   GFR, Estimated >54 >09 mL/min    Comment: (NOTE) Calculated using the CKD-EPI Creatinine Equation (2021)    Anion gap 10 5 - 15    Comment: Performed at [facility], [address] [place], [place] [address]    DG Chest 1 View Result Date: 12/13/2023 CLINICAL DATA:  Pneumomediastinum, follow-up. EXAM: CHEST  1 VIEW COMPARISON:  Radiograph and CT 12/11/2023 FINDINGS: Known  pneumomediastinum on prior exam not well demonstrated currently. The heart is normal in size. No pneumothorax, pleural effusion or focal airspace disease. IMPRESSION: Known pneumomediastinum on prior exam not well demonstrated. No new abnormality. Electronically Signed   By: Chadwick Colonel M.D.   On: 12/13/2023 12:25   DG ESOPHAGUS W SINGLE CM (SOL OR THIN BA) Result Date: 12/12/2023 CLINICAL DATA:  [id number] Pneumomediastinum [facility]) [id number] 28 year old male from the emergency department with CT revealing pneumomediastinum for esophagram. EXAM: ESOPHAGUS/BARIUM SWALLOW/TABLET STUDY TECHNIQUE: Single contrast examination was performed using thin lwater soluble barium. This exam was performed by Abigail C. Emerson, PA-C, and was supervised and interpreted by Dr. Darylene Epley. FLUOROSCOPY: Radiation Exposure Index and estimated peak skin dose (PSD); Reference air kerma (RAK), 9.8 mGy. Kerma-area product (KAP), 240.7 uGy*m. COMPARISON:  Chest 12/11/2023.  CT chest 12/11/2023. FINDINGS: Swallowing: Appears normal. No vestibular penetration or aspiration seen. Pharynx: Unremarkable. Esophagus: Normal appearance, mucosal lesions or strictures. Esophageal motility: Within normal limits. Hiatal Hernia: None. Gastroesophageal reflux: None visualized. Ingested 13mm barium tablet: Not given. Other: Trace pneumomediastinum IMPRESSION: 1. Normal esophagram. No extraluminal extravasation of ingested contrast. 2. Trace pneumomediastinum. Performed By Lorinda Root, PA-C Electronically Signed   By: Art Largo M.D.   On: 12/12/2023 14:30   CT CHEST WO CONTRAST Result Date: 12/11/2023 CLINICAL DATA:  Pneumomediastinum on earlier abdominal CT EXAM: CT CHEST WITHOUT CONTRAST TECHNIQUE: Multidetector CT imaging of the chest was performed following the standard protocol without IV contrast. RADIATION DOSE REDUCTION: This exam was performed according to the departmental dose-optimization program which includes automated exposure control,  adjustment of the mA and/or kV according to patient size and/or use of iterative reconstruction technique. COMPARISON:  12/11/2023, 12/09/2023 FINDINGS: Cardiovascular: Unenhanced imaging of the heart is unremarkable without pericardial effusion. Normal caliber of the thoracic aorta. Assessment of the vascular lumen cannot be performed without intravenous contrast. Mediastinum/Nodes: Pneumomediastinum is again identified, with gas dissecting superiorly into the neck. Thyroid, trachea, and esophagus appear unremarkable. No pathologic adenopathy. Lungs/Pleura: No acute airspace disease,  effusion, or pneumothorax. Central airways are patent. Upper Abdomen: No acute abnormality. Musculoskeletal: No acute or destructive bony abnormalities. Reconstructed images demonstrate no additional findings. IMPRESSION: 1. Extensive pneumomediastinum, with gas dissecting superiorly into the neck. Source of pneumomediastinum is not identified. 2. Otherwise no acute intrathoracic process. No evidence of pneumothorax. Electronically Signed   By: Bobbye Burrow M.D.   On: 12/11/2023 23:13   DG Chest Portable 1 View Result Date: 12/11/2023 CLINICAL DATA:  Pneumomediastinum EXAM: PORTABLE CHEST 1 VIEW COMPARISON:  None Available. FINDINGS: Lungs are clear. No pneumothorax or pleural effusion. Cardiac size within normal limits. Pulmonary vascularity is normal. There is subcutaneous gas within the left cervical tissues at the level of the glottis. No acute bone abnormality. IMPRESSION: 1. Subcutaneous gas within the left cervical tissues at the level of the glottis. Electronically Signed   By: Worthy Heads M.D.   On: 12/11/2023 22:57   CT ABDOMEN PELVIS W CONTRAST Result Date: 12/11/2023 CLINICAL DATA:  Acute nonlocalized abdominal pain EXAM: CT ABDOMEN AND PELVIS WITH CONTRAST TECHNIQUE: Multidetector CT imaging of the abdomen and pelvis was performed using the standard protocol following bolus administration of intravenous  contrast. RADIATION DOSE REDUCTION: This exam was performed according to the departmental dose-optimization program which includes automated exposure control, adjustment of the mA and/or kV according to patient size and/or use of iterative reconstruction technique. CONTRAST:  75mL OMNIPAQUE  IOHEXOL  350 MG/ML SOLN COMPARISON:  12/09/2023, 05/23/2016 FINDINGS: Lower chest: There is pneumomediastinum present within the visualized lower thorax interdigitating between the descending thoracic aorta and distal esophagus. No pleural effusion identified within visualized lung bases. Cardiac size within normal limits. Hepatobiliary: 2 cm peripheral enhancing lesion within the right hepatic lobe, axial image # 8/3, is not well assessed on this single phase examination but may represent a flash fill hemangioma or area of vascular shunting. This appears stable since immediate prior examination. Scattered tiny hepatic cysts are identified. Liver otherwise unremarkable. Gallbladder unremarkable. No intra or extrahepatic biliary ductal dilation. Pancreas: Unremarkable Spleen: Unremarkable Adrenals/Urinary Tract: Adrenal glands are unremarkable. Kidneys are normal, without renal calculi, focal lesion, or hydronephrosis. Bladder is unremarkable. Stomach/Bowel: Stomach is within normal limits. Appendix appears normal. No evidence of bowel wall thickening, distention, or inflammatory changes. Vascular/Lymphatic: No significant vascular findings are present. No enlarged abdominal or pelvic lymph nodes. Reproductive: Prostate is unremarkable. Other: No abdominal wall hernia or abnormality. No abdominopelvic ascites. Musculoskeletal: No acute or significant osseous findings. IMPRESSION: 1. Pneumomediastinum within the visualized lower thorax. No pneumothorax or pleural effusion identified within the visualized lung bases. Dedicated CT imaging of the chest may be helpful for further evaluation. 2. No acute intra-abdominal or pelvic  pathology identified. 3. 2 cm peripheral enhancing lesion within the right hepatic lobe, not well assessed on this single phase examination but may represent a flash fill hemangioma or area of vascular shunting. If indicated, this can be confirmed liver protocol CT or MRI examination Electronically Signed   By: Worthy Heads M.D.   On: 12/11/2023 21:34    Review of Systems  Constitutional:  Negative for chills and fever.  Respiratory:  Negative for cough and shortness of breath.   Cardiovascular:  Positive for chest pain.  Gastrointestinal:  Positive for abdominal pain, nausea and vomiting.   Blood pressure 114/81, pulse 65, temperature 97.9 F (36.6 C), temperature source Oral, resp. rate 18, height 5\' 5"  (1.651 m), weight 80.6 kg, SpO2 99%. Physical Exam Constitutional:      Appearance: He is well-developed.  Cardiovascular:  Rate and Rhythm: Normal rate and regular rhythm.  Pulmonary:     Effort: Pulmonary effort is normal.     Breath sounds: Normal breath sounds.  Abdominal:     General: There is no distension.     Tenderness: There is no abdominal tenderness.  Neurological:     Mental Status: He is alert.   Narrative & Impression CLINICAL DATA:  Pneumomediastinum on earlier abdominal CT   EXAM: CT CHEST WITHOUT CONTRAST   TECHNIQUE: Multidetector CT imaging of the chest was performed following the standard protocol without IV contrast.   RADIATION DOSE REDUCTION: This exam was performed according to the departmental dose-optimization program which includes automated exposure control, adjustment of the mA and/or kV according to patient size and/or use of iterative reconstruction technique.   COMPARISON:  12/11/2023, 12/09/2023   FINDINGS: Cardiovascular: Unenhanced imaging of the heart is unremarkable without pericardial effusion. Normal caliber of the thoracic aorta. Assessment of the vascular lumen cannot be performed without intravenous contrast.    Mediastinum/Nodes: Pneumomediastinum is again identified, with gas dissecting superiorly into the neck. Thyroid, trachea, and esophagus appear unremarkable. No pathologic adenopathy.   Lungs/Pleura: No acute airspace disease, effusion, or pneumothorax. Central airways are patent.   Upper Abdomen: No acute abnormality.   Musculoskeletal: No acute or destructive bony abnormalities. Reconstructed images demonstrate no additional findings.   IMPRESSION: 1. Extensive pneumomediastinum, with gas dissecting superiorly into the neck. Source of pneumomediastinum is not identified. 2. Otherwise no acute intrathoracic process. No evidence of pneumothorax.     Electronically Signed   By: Bobbye Burrow M.D.   On: 12/11/2023 23:13   Narrative & Impression  CLINICAL DATA:  [id number] Pneumomediastinum [facility]) [id number]   28 year old male from the emergency department with CT revealing pneumomediastinum for esophagram.   EXAM: ESOPHAGUS/BARIUM SWALLOW/TABLET STUDY   TECHNIQUE: Single contrast examination was performed using thin lwater soluble barium. This exam was performed by Abigail C. Emerson, PA-C, and was supervised and interpreted by Dr. Darylene Epley.   FLUOROSCOPY: Radiation Exposure Index and estimated peak skin dose (PSD);   Reference air kerma (RAK), 9.8 mGy. Kerma-area product (KAP), 240.7 uGy*m.   COMPARISON:  Chest 12/11/2023.  CT chest 12/11/2023.   FINDINGS: Swallowing: Appears normal. No vestibular penetration or aspiration seen.   Pharynx: Unremarkable.   Esophagus: Normal appearance, mucosal lesions or strictures.   Esophageal motility: Within normal limits.   Hiatal Hernia: None.   Gastroesophageal reflux: None visualized.   Ingested 13mm barium tablet: Not given.   Other: Trace pneumomediastinum   IMPRESSION: 1. Normal esophagram. No extraluminal extravasation of ingested contrast. 2. Trace pneumomediastinum.   Performed By Lorinda Root, PA-C      Electronically Signed   By: Art Largo M.D.   On: 12/12/2023 14:30      Assessment/Plan:  There is no evidence of esophageal perforation based on the esophagram.  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  I would expect this to resolve without intervention.  He may continue to have some discomfort in his chest and with swallowing until the air dissipates.  I will continue advancing his diet from clear liquids to soft.  I do not think there is a need to continue antibiotics.  No further follow-up is needed as long as he continues to progress.  I discussed the CT and esophagram results with the patient and his mother.  I discussed the importance of staying away from cannabis with him and the  potential for development of an esophageal perforation in the future if he has continued episodes of hyperemesis.  Bartley Lightning, MD  Bartley Lightning 12/13/2023, 12:42 PM

## 2023-12-13 NOTE — Progress Notes (Addendum)
  Progress Note   Patient: Jose Mays NWG:[id number] DOB: 06/13/1996 DOA: 12/11/2023     2 DOS: the patient was seen and examined on 12/13/2023   Brief hospital course: Avishai Reihl is a 28 y.o. male with medical history significant for THC use, hyperemesis cannabis, who presents to the ER with 4 days of nausea vomiting and intermittent diarrhea.     In the ER, initially tachycardic, a chest x-ray revealed subcutaneous gas within the left cervical tissues at the level of the glottis.  He subsequently had a CT chest without contrast that revealed extensive pneumomediastinum with gas dissecting superiorly into the neck.  No evidence of pneumothorax.     The patient received IV antiemetics, IV opiate-based analgesics, IV antibiotics, and IV fluid in the ER.   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  CTS will see in consultation.   12/12/23 -vomiting resolved. Awaiting esophagogram and CVTS consult.     Assessment and Plan: * Pneumomediastinum (HCC) Esophagogram with no perforation Follow up chest radiograph with no further signs of pneumomediastinum.   Plan to advance diet.  Discontinue antibiotic therapy  No further intervention per CT surgery, likely patient ruptured as small bleb and now has resolved. Reactive leukocytosis   Hypokalemia Continue K correction with Kcl and follow up renal function and electrolytes in am.  Advance diet.  No IV fluids needed.   Nausea & vomiting Clinically improved.  Avoid THC/.   Subjective: patient is feeling better, no chest pain or dyspnea, no further nausea or vomiting   Physical Exam: Vitals:   12/13/23 0040 12/13/23 0434 12/13/23 0834 12/13/23 1217  BP: (!) 129/57 120/71 114/81   Pulse: (!) 54 67 66 65  Resp: 20 19 18 18   Temp: 97.8 F (36.6 C) 97.7 F (36.5 C) 98 F (36.7 C) 97.9 F (36.6 C)   TempSrc: Oral Oral Oral Oral  SpO2: 98% 100% 99% 99%  Weight:      Height:       Neurology awake and alert ENT with no pallor or icterus Cardiovascular with S1 and S2 present and regular Respiratory with no rales or wheezing  Abdomen with no distention  No lower extremity edema  Data Reviewed:    Family Communication: no family at the bedside   Disposition: Status is: Inpatient Remains inpatient appropriate because: recovering from pneumomediastinum, plan for discharge home tomorrow   Planned Discharge Destination: Home      Author: Albertus Alt, MD 12/13/2023 2:55 PM  For on call review [email or web address].

## 2023-12-13 NOTE — Assessment & Plan Note (Signed)
 Clinically resolved, he has been advised to avoid THC.

## 2023-12-13 NOTE — Assessment & Plan Note (Signed)
 Continue K correction with Kcl and follow up renal function and electrolytes in am.  Advance diet.  No IV fluids needed.

## 2023-12-13 NOTE — Plan of Care (Signed)

## 2023-12-13 NOTE — Hospital Course (Signed)
 Jose Mays was admitted to the hospital with the working diagnosis of pneumomediastinum due to intractable vomiting.   28 y.o. male with medical history significant for THC use, hyperemesis cannabis, who presents to the ER with 4 days of nausea vomiting, abdominal pain and intermittent diarrhea.   On his initial physical examination his blood pressure was 103/65, HR 55, RR 16 and 02 saturation 100% on room air.  Lungs with no wheezing or rhonchi, heart with S1 and S2 present and regular, abdomen with no distention, soft and non tender, bowel sounds positive, no lower extremity edema.   Na 136, K 2,9 Cl 97 bicarbonate 21 glucose 125, bun 16 cr 1,89  AST 79 and ALT 33  Wbc 11,5 hgb 17,2 plt 461  Urine analysis SG >1.046, protein 30, negative leukocytes and negative hgb  Toxicology positive for opiates and tretrahydrocannabinol.   Chest radiograph with subcutaneous gas within the left cervical tissues at the level of the glottis.    CT abdomen and pelvis with pneumomediastinum within the visualized lower thorax. No pneumothorax or pleural effusion identified within visualized lung bases.  No acute intra abdominal or pelvic pathology identified.  2 cm peripheral enhancing lesion within the right hepatic lobe, not well assessed, may represent hemangioma or area of vascular shunting.   CT chest with extensive pneumomediastinum with gas dissecting superiorly into the neck, source of pneumomediastinum is not identified. No pneumothorax.    The patient received IV antiemetics, IV opiate-based analgesics, IV antibiotics, and IV fluids  Due to concern for Boerhaave syndrome, discussed the case with cardiothoracic surgery who recommended n.p.o., and esophagram.    12/12/23 -vomiting resolved. Awaiting esophagogram and CVTS consult.  12/13/23 esophagogram normal, diet was advanced with good toleration. Ct surgery evaluation, recommended continue supportive care, avoid THC.  12/14/23 electrolytes were  corrected, patient will follow up as outpatient.

## 2023-12-14 DIAGNOSIS — F1290 Cannabis use, unspecified, uncomplicated: Secondary | ICD-10-CM

## 2023-12-14 DIAGNOSIS — N492 Inflammatory disorders of scrotum: Secondary | ICD-10-CM

## 2023-12-14 LAB — BASIC METABOLIC PANEL WITH GFR
Anion gap: 9 (ref 5–15)
BUN: 8 mg/dL (ref 6–20)
CO2: 28 mmol/L (ref 22–32)
Calcium: 9 mg/dL (ref 8.9–10.3)
Chloride: 100 mmol/L (ref 98–111)
Creatinine, Ser: 0.97 mg/dL (ref 0.61–1.24)
GFR, Estimated: >60 mL/min (ref >60)
Glucose, Bld: 89 mg/dL (ref 70–99)
Potassium: 4 mmol/L (ref 3.5–5.1)
Sodium: 137 mmol/L (ref 135–145)

## 2023-12-14 NOTE — Progress Notes (Signed)
   12/14/23 1108  AVS Discharge Documentation  AVS Discharge Instructions Including Medications Provided to patient/caregiver  Name of Person Receiving AVS Discharge Instructions Including Medications Synthia Ewing RN  Name of Clinician That Reviewed AVS Discharge Instructions Including Medications Gara July     AVS instructions were reviewed with patient. All questions were answered all personal belongings were returned.

## 2023-12-14 NOTE — Discharge Summary (Signed)
 Physician Discharge Summary   Patient: Jose Mays MRN: [id number] DOB: June 02, 1996  Admit date:     12/11/2023  Discharge date: 12/14/23  Discharge Physician: Curlee Doss Graves Nipp   PCP: Pcp, No   Recommendations at discharge:    Patient has been advised to avoid THC use.  Follow up with primary care in 7 to 10 days.   Discharge Diagnoses: Principal Problem:   Pneumomediastinum (HCC) Active Problems:   Hypokalemia   Nausea & vomiting  Resolved Problems:   * No resolved hospital problems. [facility] Course: Jose Mays was admitted to the hospital with the working diagnosis of pneumomediastinum due to intractable vomiting.   28 y.o. male with medical history significant for THC use, hyperemesis cannabis, who presents to the ER with 4 days of nausea vomiting, abdominal pain and intermittent diarrhea.   On his initial physical examination his blood pressure was 103/65, HR 55, RR 16 and 02 saturation 100% on room air.  Lungs with no wheezing or rhonchi, heart with S1 and S2 present and regular, abdomen with no distention, soft and non tender, bowel sounds positive, no lower extremity edema.   Na 136, K 2,9 Cl 97 bicarbonate 21 glucose 125, bun 16 cr 1,89  AST 79 and ALT 33  Wbc 11,5 hgb 17,2 plt 461  Urine analysis SG >1.046, protein 30, negative leukocytes and negative hgb  Toxicology positive for opiates and tretrahydrocannabinol.   Chest radiograph with subcutaneous gas within the left cervical tissues at the level of the glottis.    CT abdomen and pelvis with pneumomediastinum within the visualized lower thorax. No pneumothorax or pleural effusion identified within visualized lung bases.  No acute intra abdominal or pelvic pathology identified.  2 cm peripheral enhancing lesion within the right hepatic lobe, not well assessed, may represent hemangioma or area of vascular shunting.   CT chest with extensive pneumomediastinum with gas dissecting superiorly into the neck,  source of pneumomediastinum is not identified. No pneumothorax.    The patient received IV antiemetics, IV opiate-based analgesics, IV antibiotics, and IV fluids  Due to concern for Boerhaave syndrome, discussed the case with cardiothoracic surgery who recommended n.p.o., and esophagram.    12/12/23 -vomiting resolved. Awaiting esophagogram and CVTS consult.  12/13/23 esophagogram normal, diet was advanced with good toleration. Ct surgery evaluation, recommended continue supportive care, avoid THC.  12/14/23 electrolytes were corrected, patient will follow up as outpatient.   Assessment and Plan: * Pneumomediastinum (HCC) Esophagogram with no perforation Follow up chest radiograph with no further signs of pneumomediastinum.   Antibiotic therapy was discontinued and diet was advanced with good toleration.  No further intervention per CT surgery, likely patient ruptured as small bleb and now has resolved. Reactive leukocytosis   Hypokalemia AKI, hyponatremia.   Patient was placed on IV fluids and electrolytes were corrected.  At the time of his discharge he is tolerating po well.  Renal function with serum cr at 0,97 with K at 4,0 and serum bicarbonate at 28  Na 137   Plan to follow up electrolytes as outpatient.    Cannabinoid hyperemesis syndrome Clinically resolved, he has been advised to avoid THC.        Consultants: CT surgery  Procedures performed: none   Disposition: Home Diet recommendation:  Regular diet DISCHARGE MEDICATION: Allergies as of 12/14/2023   No Known Allergies      Medication List     STOP taking these medications    ondansetron  4 MG disintegrating tablet Commonly  known as: ZOFRAN -ODT   TIGER BALM ARTHRITIS RUB EX        Discharge Exam: Filed Weights   12/12/23 1601  Weight: 80.6 kg   BP 109/86 (BP Location: Right Arm)   Pulse 68   Temp 97.9 F (36.6 C) (Oral)   Resp 17   Ht 5\' 5"  (1.651 m)   Wt 80.6 kg   SpO2 98%   BMI  29.57 kg/m   Patient is feeling better, no nausea or vomiting, no abdominal pain. No chest pain or dyspnea.   Neurology awake and alert ENT with no pallor or icterus Cardiovascular with S1 and S2 present and regular with no gallops, rubs or murmurs Respiratory with no rales or wheezing, no rhonchi  Abdomen not distended soft and non tender No lower extremity edema   Condition at discharge: stable  The results of significant diagnostics from this hospitalization (including imaging, microbiology, ancillary and laboratory) are listed below for reference.   Imaging Studies: DG Chest 1 View Result Date: 12/13/2023 CLINICAL DATA:  Pneumomediastinum, follow-up. EXAM: CHEST  1 VIEW COMPARISON:  Radiograph and CT 12/11/2023 FINDINGS: Known pneumomediastinum on prior exam not well demonstrated currently. The heart is normal in size. No pneumothorax, pleural effusion or focal airspace disease. IMPRESSION: Known pneumomediastinum on prior exam not well demonstrated. No new abnormality. Electronically Signed   By: Jose Colonel M.D.   On: 12/13/2023 12:25   DG ESOPHAGUS W SINGLE CM (SOL OR THIN BA) Result Date: 12/12/2023 CLINICAL DATA:  [id number] Pneumomediastinum [facility]) 6286 28 year old male from the emergency department with CT revealing pneumomediastinum for esophagram. EXAM: ESOPHAGUS/BARIUM SWALLOW/TABLET STUDY TECHNIQUE: Single contrast examination was performed using thin lwater soluble barium. This exam was performed by Jose Mays, and was supervised and interpreted by Jose Mays. FLUOROSCOPY: Radiation Exposure Index and estimated peak skin dose (PSD); Reference air kerma (RAK), 9.8 mGy. Kerma-area product (KAP), 240.7 uGy*m. COMPARISON:  Chest 12/11/2023.  CT chest 12/11/2023. FINDINGS: Swallowing: Appears normal. No vestibular penetration or aspiration seen. Pharynx: Unremarkable. Esophagus: Normal appearance, mucosal lesions or strictures. Esophageal motility: Within normal limits.  Hiatal Hernia: None. Gastroesophageal reflux: None visualized. Ingested 13mm barium tablet: Not given. Other: Trace pneumomediastinum IMPRESSION: 1. Normal esophagram. No extraluminal extravasation of ingested contrast. 2. Trace pneumomediastinum. Performed By Lorinda Root, Mays Electronically Signed   By: Art Largo M.D.   On: 12/12/2023 14:30   CT CHEST WO CONTRAST Result Date: 12/11/2023 CLINICAL DATA:  Pneumomediastinum on earlier abdominal CT EXAM: CT CHEST WITHOUT CONTRAST TECHNIQUE: Multidetector CT imaging of the chest was performed following the standard protocol without IV contrast. RADIATION DOSE REDUCTION: This exam was performed according to the departmental dose-optimization program which includes automated exposure control, adjustment of the mA and/or kV according to patient size and/or use of iterative reconstruction technique. COMPARISON:  12/11/2023, 12/09/2023 FINDINGS: Cardiovascular: Unenhanced imaging of the heart is unremarkable without pericardial effusion. Normal caliber of the thoracic aorta. Assessment of the vascular lumen cannot be performed without intravenous contrast. Mediastinum/Nodes: Pneumomediastinum is again identified, with gas dissecting superiorly into the neck. Thyroid, trachea, and esophagus appear unremarkable. No pathologic adenopathy. Lungs/Pleura: No acute airspace disease, effusion, or pneumothorax. Central airways are patent. Upper Abdomen: No acute abnormality. Musculoskeletal: No acute or destructive bony abnormalities. Reconstructed images demonstrate no additional findings. IMPRESSION: 1. Extensive pneumomediastinum, with gas dissecting superiorly into the neck. Source of pneumomediastinum is not identified. 2. Otherwise no acute intrathoracic process. No evidence of pneumothorax. Electronically Signed   By: Bambi Lever  Bevin Bucks M.D.   On: 12/11/2023 23:13   DG Chest Portable 1 View Result Date: 12/11/2023 CLINICAL DATA:  Pneumomediastinum EXAM: PORTABLE CHEST  1 VIEW COMPARISON:  None Available. FINDINGS: Lungs are clear. No pneumothorax or pleural effusion. Cardiac size within normal limits. Pulmonary vascularity is normal. There is subcutaneous gas within the left cervical tissues at the level of the glottis. No acute bone abnormality. IMPRESSION: 1. Subcutaneous gas within the left cervical tissues at the level of the glottis. Electronically Signed   By: Worthy Heads M.D.   On: 12/11/2023 22:57   CT ABDOMEN PELVIS W CONTRAST Result Date: 12/11/2023 CLINICAL DATA:  Acute nonlocalized abdominal pain EXAM: CT ABDOMEN AND PELVIS WITH CONTRAST TECHNIQUE: Multidetector CT imaging of the abdomen and pelvis was performed using the standard protocol following bolus administration of intravenous contrast. RADIATION DOSE REDUCTION: This exam was performed according to the departmental dose-optimization program which includes automated exposure control, adjustment of the mA and/or kV according to patient size and/or use of iterative reconstruction technique. CONTRAST:  75mL OMNIPAQUE  IOHEXOL  350 MG/ML SOLN COMPARISON:  12/09/2023, 05/23/2016 FINDINGS: Lower chest: There is pneumomediastinum present within the visualized lower thorax interdigitating between the descending thoracic aorta and distal esophagus. No pleural effusion identified within visualized lung bases. Cardiac size within normal limits. Hepatobiliary: 2 cm peripheral enhancing lesion within the right hepatic lobe, axial image # 8/3, is not well assessed on this single phase examination but may represent a flash fill hemangioma or area of vascular shunting. This appears stable since immediate prior examination. Scattered tiny hepatic cysts are identified. Liver otherwise unremarkable. Gallbladder unremarkable. No intra or extrahepatic biliary ductal dilation. Pancreas: Unremarkable Spleen: Unremarkable Adrenals/Urinary Tract: Adrenal glands are unremarkable. Kidneys are normal, without renal calculi, focal  lesion, or hydronephrosis. Bladder is unremarkable. Stomach/Bowel: Stomach is within normal limits. Appendix appears normal. No evidence of bowel wall thickening, distention, or inflammatory changes. Vascular/Lymphatic: No significant vascular findings are present. No enlarged abdominal or pelvic lymph nodes. Reproductive: Prostate is unremarkable. Other: No abdominal wall hernia or abnormality. No abdominopelvic ascites. Musculoskeletal: No acute or significant osseous findings. IMPRESSION: 1. Pneumomediastinum within the visualized lower thorax. No pneumothorax or pleural effusion identified within the visualized lung bases. Dedicated CT imaging of the chest may be helpful for further evaluation. 2. No acute intra-abdominal or pelvic pathology identified. 3. 2 cm peripheral enhancing lesion within the right hepatic lobe, not well assessed on this single phase examination but may represent a flash fill hemangioma or area of vascular shunting. If indicated, this can be confirmed liver protocol CT or MRI examination Electronically Signed   By: Worthy Heads M.D.   On: 12/11/2023 21:34   CT ABDOMEN PELVIS W CONTRAST Result Date: 12/09/2023 CLINICAL DATA:  Acute abdominal pain EXAM: CT ABDOMEN AND PELVIS WITH CONTRAST TECHNIQUE: Multidetector CT imaging of the abdomen and pelvis was performed using the standard protocol following bolus administration of intravenous contrast. RADIATION DOSE REDUCTION: This exam was performed according to the departmental dose-optimization program which includes automated exposure control, adjustment of the mA and/or kV according to patient size and/or use of iterative reconstruction technique. CONTRAST:  OMNIPAQUE  IOHEXOL  300 MG/ML  SOLN COMPARISON:  05/23/2016 FINDINGS: Lower chest: No acute abnormality. Hepatobiliary: Gallbladder is within normal limits. Liver demonstrates a few scattered hypodensities likely representing cysts. Some hyperdense lesions are noted within the  right lobe likely representing hemangiomas but incompletely evaluated on this exam. Pancreas: Unremarkable. No pancreatic ductal dilatation or surrounding inflammatory changes. Spleen: Normal in  size without focal abnormality. Adrenals/Urinary Tract: Adrenal glands are within normal limits. Kidneys show normal enhancement pattern. No renal calculi or obstructive changes are seen. The bladder is within normal limits. Stomach/Bowel: No obstructive or inflammatory changes of colon are noted. The appendix is within normal limits. Small bowel and stomach are unremarkable. Vascular/Lymphatic: No significant vascular findings are present. No enlarged abdominal or pelvic lymph nodes. Reproductive: Prostate is unremarkable. Other: No abdominal wall hernia or abnormality. No abdominopelvic ascites. Musculoskeletal: No acute or significant osseous findings. IMPRESSION: Scattered hepatic cysts and hyperdense lesions likely representing hemangiomas within the liver. No other focal abnormality is noted. Electronically Signed   By: Violeta Grey M.D.   On: 12/09/2023 03:05    Microbiology: Results for orders placed or performed during the hospital encounter of 08/09/22  Chlamydia/NGC rt PCR ([facility] only)     Status: None   Collection Time: 08/09/22  6:29 PM   Specimen: Urine  Result Value Ref Range Status   Specimen source GC/Chlam URINE, RANDOM  Final   Chlamydia Tr NOT DETECTED NOT DETECTED Final   N gonorrhoeae NOT DETECTED NOT DETECTED Final    Comment: (NOTE) This CT/NG assay has not been evaluated in patients with a history of  hysterectomy. Performed at [facility], [address], [place], [place] [address]   Aerobic/Anaerobic Culture w Gram Stain (surgical/deep wound)     Status: None   Collection Time: 08/11/22  8:03 AM   Specimen: Abscess  Result Value Ref Range Status   Specimen Description   Final    ABSCESS Performed at [facility], [address] [place], [place]  [address]    Special Requests   Final    NONE Performed at [facility], [address] [place], [place] [address]    Gram Stain   Final    FEW WBC PRESENT, PREDOMINANTLY PMN FEW GRAM POSITIVE COCCI IN PAIRS FEW GRAM NEGATIVE RODS    Culture   Final    RARE DIPHTHEROIDS(CORYNEBACTERIUM SPECIES) Standardized susceptibility testing for this organism is not available. FEW BACTEROIDES INTERMEDIUS BETA LACTAMASE NEGATIVE Performed at [facility], [address], [place], [place] [address]    Report Status 08/13/2022 FINAL  Final    Labs: CBC: Recent Labs  Lab 12/09/23 0045 12/11/23 1725 12/12/23 0458  WBC 13.2* 11.5* 12.3*  NEUTROABS 9.5* 7.6  --   HGB 15.1 17.2* 15.7  HCT 45.2 48.9 45.3  MCV 94.6 89.2 93.4  PLT 408* 461* 375   Basic Metabolic Panel: Recent Labs  Lab 12/09/23 0045 12/11/23 1725 12/12/23 0458 12/13/23 0413 12/14/23 0442  NA 140 136 134* 136 137  K 3.3* 2.9* 2.9* 3.1* 4.0  CL 103 97* 98 99 100  CO2 24 21* 25 27 28   GLUCOSE 160* 125* 87 85 89  BUN 16 16 14 11 8   CREATININE 1.06 1.89* 1.21 0.99 0.97  CALCIUM 9.4 10.1 8.7* 8.5* 9.0  MG  --   --  2.0  --   --   PHOS  --   --  3.9  --   --    Liver Function Tests: Recent Labs  Lab 12/09/23 0045 12/11/23 1725 12/12/23 0458 12/13/23 0413  AST 28 79* 63* 52*  ALT 15 33 31 31  ALKPHOS 75 87 74 63  BILITOT 0.9 1.6* 1.5* 1.2  PROT 8.6* 9.4* 7.6 7.0  ALBUMIN 4.5 4.9 4.0 3.5   CBG: No results for input(s): "GLUCAP" in the last 168  hours.  Discharge time spent: greater than 30 minutes.  Signed: Albertus Alt, MD [facility] 12/14/2023

## 2023-12-29 ENCOUNTER — Inpatient Hospital Stay (HOSPITAL_COMMUNITY)
Admission: EM | Admit: 2023-12-29 | Discharge: 2023-12-31 | DRG: 718 | Disposition: A | Discharge status: Home / Self Care | Payer: MEDICAID | Attending: Internal Medicine | Admitting: Internal Medicine

## 2023-12-29 ENCOUNTER — Other Ambulatory Visit: Payer: Self-pay

## 2023-12-29 ENCOUNTER — Emergency Department (HOSPITAL_COMMUNITY): Payer: MEDICAID

## 2023-12-29 ENCOUNTER — Encounter (HOSPITAL_COMMUNITY): Payer: Self-pay | Admitting: *Deleted

## 2023-12-29 ENCOUNTER — Inpatient Hospital Stay (HOSPITAL_COMMUNITY): Payer: MEDICAID | Admitting: Certified Registered Nurse Anesthetist

## 2023-12-29 ENCOUNTER — Encounter (HOSPITAL_COMMUNITY)
Admission: EM | Disposition: A | Discharge status: Home / Self Care | Payer: Self-pay | Source: Home / Self Care | Attending: Student

## 2023-12-29 DIAGNOSIS — N492 Inflammatory disorders of scrotum: Secondary | ICD-10-CM

## 2023-12-29 DIAGNOSIS — D72829 Elevated white blood cell count, unspecified: Secondary | ICD-10-CM | POA: Diagnosis present

## 2023-12-29 DIAGNOSIS — Z720 Tobacco use: Secondary | ICD-10-CM | POA: Diagnosis present

## 2023-12-29 DIAGNOSIS — L732 Hidradenitis suppurativa: Secondary | ICD-10-CM | POA: Diagnosis present

## 2023-12-29 DIAGNOSIS — F17210 Nicotine dependence, cigarettes, uncomplicated: Secondary | ICD-10-CM | POA: Diagnosis present

## 2023-12-29 DIAGNOSIS — I2510 Atherosclerotic heart disease of native coronary artery without angina pectoris: Secondary | ICD-10-CM | POA: Diagnosis present

## 2023-12-29 LAB — CBC WITH DIFFERENTIAL/PLATELET
Abs Immature Granulocytes: 0.05 10*3/uL (ref 0.00–0.07)
Basophils Absolute: 0 10*3/uL (ref 0.0–0.1)
Basophils Relative: 0 %
Eosinophils Absolute: 0.1 10*3/uL (ref 0.0–0.5)
Eosinophils Relative: 0 %
HCT: 46.4 % (ref 39.0–52.0)
Hemoglobin: 15.4 g/dL (ref 13.0–17.0)
Immature Granulocytes: 0 %
Lymphocytes Relative: 13 %
Lymphs Abs: 1.8 10*3/uL (ref 0.7–4.0)
MCH: 31.9 pg (ref 26.0–34.0)
MCHC: 33.2 g/dL (ref 30.0–36.0)
MCV: 96.1 fL (ref 80.0–100.0)
Monocytes Absolute: 1.2 10*3/uL — ABNORMAL HIGH (ref 0.1–1.0)
Monocytes Relative: 9 %
Neutro Abs: 10.6 10*3/uL — ABNORMAL HIGH (ref 1.7–7.7)
Neutrophils Relative %: 78 %
Platelets: 373 10*3/uL (ref 150–400)
RBC: 4.83 MIL/uL (ref 4.22–5.81)
RDW: 12 % (ref 11.5–15.5)
WBC: 13.8 10*3/uL — ABNORMAL HIGH (ref 4.0–10.5)
nRBC: 0 % (ref 0.0–0.2)

## 2023-12-29 LAB — URINALYSIS, ROUTINE W REFLEX MICROSCOPIC
Bacteria, UA: NONE SEEN
Bilirubin Urine: NEGATIVE
Glucose, UA: NEGATIVE mg/dL
Hgb urine dipstick: NEGATIVE
Ketones, ur: NEGATIVE mg/dL
Nitrite: NEGATIVE
Protein, ur: 30 mg/dL — AB
Specific Gravity, Urine: >1.046 — ABNORMAL HIGH (ref 1.005–1.030)
pH: 5 (ref 5.0–8.0)

## 2023-12-29 LAB — BASIC METABOLIC PANEL WITH GFR
Anion gap: 10 (ref 5–15)
BUN: 10 mg/dL (ref 6–20)
CO2: 26 mmol/L (ref 22–32)
Calcium: 8.6 mg/dL — ABNORMAL LOW (ref 8.9–10.3)
Chloride: 98 mmol/L (ref 98–111)
Creatinine, Ser: 0.92 mg/dL (ref 0.61–1.24)
GFR, Estimated: >60 mL/min (ref >60)
Glucose, Bld: 96 mg/dL (ref 70–99)
Potassium: 3.7 mmol/L (ref 3.5–5.1)
Sodium: 134 mmol/L — ABNORMAL LOW (ref 135–145)

## 2023-12-29 LAB — LACTIC ACID, PLASMA: Lactic Acid, Venous: 0.9 mmol/L (ref 0.5–1.9)

## 2023-12-29 LAB — CK: Total CK: 132 U/L (ref 49–397)

## 2023-12-29 LAB — HEMOGLOBIN A1C
Hgb A1c MFr Bld: 4.7 % — ABNORMAL LOW (ref 4.8–5.6)
Mean Plasma Glucose: 88.19 mg/dL

## 2023-12-29 SURGERY — EXPLORATION, SCROTUM
Anesthesia: General | Site: Scrotum

## 2023-12-29 MED ORDER — 0.9 % SODIUM CHLORIDE (POUR BTL) OPTIME
TOPICAL | Status: DC | PRN
  Administered 2023-12-29: 1000 mL

## 2023-12-29 MED ORDER — OXYCODONE HCL 5 MG PO TABS
5.0000 mg | ORAL_TABLET | ORAL | Status: DC | PRN
  Administered 2023-12-29: 5 mg via ORAL
  Filled 2023-12-29: qty 1

## 2023-12-29 MED ORDER — OXYCODONE HCL 5 MG PO TABS: 5.0000 mg | ORAL_TABLET | Freq: Once | ORAL | Status: DC | PRN

## 2023-12-29 MED ORDER — POLYETHYLENE GLYCOL 3350 17 G PO PACK
17.0000 g | PACK | Freq: Every day | ORAL | Status: DC | PRN
Start: 2023-12-29 — End: 2023-12-31

## 2023-12-29 MED ORDER — DOCUSATE SODIUM 100 MG PO CAPS
100.0000 mg | ORAL_CAPSULE | Freq: Two times a day (BID) | ORAL | Status: DC
  Administered 2023-12-29 – 2023-12-31 (×5): 100 mg via ORAL
  Filled 2023-12-29 (×5): qty 1

## 2023-12-29 MED ORDER — VANCOMYCIN HCL 2000 MG/400ML IV SOLN
2000.0000 mg | Freq: Once | INTRAVENOUS | Status: AC
  Administered 2023-12-29 (×2): 2000 mg via INTRAVENOUS
  Filled 2023-12-29 (×2): qty 400

## 2023-12-29 MED ORDER — PROPOFOL 10 MG/ML IV BOLUS
INTRAVENOUS | Status: AC
  Filled 2023-12-29: qty 20

## 2023-12-29 MED ORDER — HEPARIN SODIUM (PORCINE) 5000 UNIT/ML IJ SOLN
5000.0000 [IU] | Freq: Two times a day (BID) | INTRAMUSCULAR | Status: DC
  Administered 2023-12-29 – 2023-12-30 (×3): 5000 [IU] via SUBCUTANEOUS
  Filled 2023-12-29 (×3): qty 1

## 2023-12-29 MED ORDER — DEXAMETHASONE SODIUM PHOSPHATE 10 MG/ML IJ SOLN
INTRAMUSCULAR | Status: DC | PRN
  Administered 2023-12-29: 10 mg via INTRAVENOUS

## 2023-12-29 MED ORDER — SODIUM CHLORIDE 0.9 % IV SOLN
2.0000 g | Freq: Three times a day (TID) | INTRAVENOUS | Status: DC
  Administered 2023-12-29 – 2023-12-31 (×6): 2 g via INTRAVENOUS
  Filled 2023-12-29 (×7): qty 12.5

## 2023-12-29 MED ORDER — BUPIVACAINE HCL (PF) 0.25 % IJ SOLN
INTRAMUSCULAR | Status: AC
Start: 2023-12-29 — End: ?
  Filled 2023-12-29: qty 30

## 2023-12-29 MED ORDER — ACETAMINOPHEN 325 MG PO TABS
650.0000 mg | ORAL_TABLET | Freq: Four times a day (QID) | ORAL | Status: DC | PRN
  Administered 2023-12-29 – 2023-12-30 (×2): 650 mg via ORAL
  Filled 2023-12-29 (×2): qty 2

## 2023-12-29 MED ORDER — CHLORHEXIDINE GLUCONATE 0.12 % MT SOLN
OROMUCOSAL | Status: AC
  Administered 2023-12-29: 15 mL via OROMUCOSAL
  Filled 2023-12-29: qty 15

## 2023-12-29 MED ORDER — OXYCODONE HCL 5 MG/5ML PO SOLN: 5.0000 mg | Freq: Once | ORAL | Status: DC | PRN

## 2023-12-29 MED ORDER — SODIUM CHLORIDE 0.9 % IV BOLUS
1000.0000 mL | Freq: Once | INTRAVENOUS | Status: AC
  Administered 2023-12-29: 1000 mL via INTRAVENOUS

## 2023-12-29 MED ORDER — LACTATED RINGERS IV SOLN: INTRAVENOUS | Status: DC

## 2023-12-29 MED ORDER — BUPIVACAINE HCL 0.25 % IJ SOLN
INTRAMUSCULAR | Status: DC | PRN
  Administered 2023-12-29: 10 mL

## 2023-12-29 MED ORDER — ONDANSETRON HCL 4 MG/2ML IJ SOLN: 4.0000 mg | Freq: Four times a day (QID) | INTRAMUSCULAR | Status: DC | PRN

## 2023-12-29 MED ORDER — PROPOFOL 10 MG/ML IV BOLUS
INTRAVENOUS | Status: DC | PRN
  Administered 2023-12-29: 200 mg via INTRAVENOUS

## 2023-12-29 MED ORDER — MIDAZOLAM HCL 2 MG/2ML IJ SOLN
INTRAMUSCULAR | Status: AC
  Filled 2023-12-29: qty 2

## 2023-12-29 MED ORDER — PANTOPRAZOLE SODIUM 40 MG IV SOLR
40.0000 mg | Freq: Two times a day (BID) | INTRAVENOUS | Status: DC
  Administered 2023-12-29 – 2023-12-31 (×5): 40 mg via INTRAVENOUS
  Filled 2023-12-29 (×5): qty 10

## 2023-12-29 MED ORDER — NICOTINE 14 MG/24HR TD PT24: 14.0000 mg | MEDICATED_PATCH | Freq: Every day | TRANSDERMAL | Status: DC | PRN

## 2023-12-29 MED ORDER — ONDANSETRON HCL 4 MG/2ML IJ SOLN
INTRAMUSCULAR | Status: DC | PRN
  Administered 2023-12-29: 4 mg via INTRAVENOUS

## 2023-12-29 MED ORDER — FENTANYL CITRATE (PF) 250 MCG/5ML IJ SOLN
INTRAMUSCULAR | Status: AC
  Filled 2023-12-29: qty 5

## 2023-12-29 MED ORDER — ACETAMINOPHEN 650 MG RE SUPP: 650.0000 mg | Freq: Four times a day (QID) | RECTAL | Status: DC | PRN

## 2023-12-29 MED ORDER — DEXMEDETOMIDINE HCL IN NACL 80 MCG/20ML IV SOLN
INTRAVENOUS | Status: DC | PRN
  Administered 2023-12-29 (×4): 4 ug via INTRAVENOUS

## 2023-12-29 MED ORDER — HYDROMORPHONE HCL 1 MG/ML IJ SOLN
INTRAMUSCULAR | Status: AC
  Filled 2023-12-29: qty 0.5

## 2023-12-29 MED ORDER — FENTANYL CITRATE (PF) 100 MCG/2ML IJ SOLN: 25.0000 ug | INTRAMUSCULAR | Status: DC | PRN

## 2023-12-29 MED ORDER — ORAL CARE MOUTH RINSE: 15.0000 mL | Freq: Once | OROMUCOSAL | Status: AC

## 2023-12-29 MED ORDER — CHLORHEXIDINE GLUCONATE 0.12 % MT SOLN: 15.0000 mL | Freq: Once | OROMUCOSAL | Status: AC

## 2023-12-29 MED ORDER — VANCOMYCIN HCL 1500 MG/300ML IV SOLN
1500.0000 mg | Freq: Two times a day (BID) | INTRAVENOUS | Status: DC
  Filled 2023-12-29: qty 300

## 2023-12-29 MED ORDER — LIDOCAINE 2% (20 MG/ML) 5 ML SYRINGE
INTRAMUSCULAR | Status: DC | PRN
  Administered 2023-12-29: 80 mg via INTRAVENOUS

## 2023-12-29 MED ORDER — IOHEXOL 350 MG/ML SOLN
75.0000 mL | Freq: Once | INTRAVENOUS | Status: AC | PRN
  Administered 2023-12-29: 75 mL via INTRAVENOUS

## 2023-12-29 MED ORDER — FENTANYL CITRATE (PF) 250 MCG/5ML IJ SOLN
INTRAMUSCULAR | Status: DC | PRN
  Administered 2023-12-29: 50 ug via INTRAVENOUS
  Administered 2023-12-29 (×2): 100 ug via INTRAVENOUS

## 2023-12-29 MED ORDER — MORPHINE SULFATE (PF) 2 MG/ML IV SOLN: 2.0000 mg | INTRAVENOUS | Status: DC | PRN

## 2023-12-29 MED ORDER — ONDANSETRON HCL 4 MG PO TABS: 4.0000 mg | ORAL_TABLET | Freq: Four times a day (QID) | ORAL | Status: DC | PRN

## 2023-12-29 MED ORDER — HYDROMORPHONE HCL 1 MG/ML IJ SOLN
INTRAMUSCULAR | Status: DC | PRN
  Administered 2023-12-29: .5 mg via INTRAVENOUS

## 2023-12-29 MED ORDER — MIDAZOLAM HCL 2 MG/2ML IJ SOLN
INTRAMUSCULAR | Status: DC | PRN
  Administered 2023-12-29: 2 mg via INTRAVENOUS

## 2023-12-29 SURGICAL SUPPLY — 22 items
BAG COUNTER SPONGE SURGICOUNT (BAG) ×1 IMPLANT
BLADE SURG 15 STRL LF DISP TIS (BLADE) ×2 IMPLANT
BNDG GAUZE DERMACEA FLUFF 4 (GAUZE/BANDAGES/DRESSINGS) IMPLANT
CANISTER SUCTION 3000ML PPV (SUCTIONS) ×1 IMPLANT
DRAPE LAPAROTOMY T 102X78X121 (DRAPES) ×1 IMPLANT
ELECTRODE REM PT RTRN 9FT ADLT (ELECTROSURGICAL) ×1 IMPLANT
GAUZE PAD ABD 8X10 STRL (GAUZE/BANDAGES/DRESSINGS) ×2 IMPLANT
GAUZE SPONGE 4X4 12PLY STRL (GAUZE/BANDAGES/DRESSINGS) ×1 IMPLANT
GLOVE SURG ORTHO 8.0 STRL STRW (GLOVE) ×1 IMPLANT
KIT BASIN OR (CUSTOM PROCEDURE TRAY) ×1 IMPLANT
KIT TURNOVER KIT B (KITS) ×1 IMPLANT
NS IRRIG 1000ML POUR BTL (IV SOLUTION) ×1 IMPLANT
PACK GENERAL/GYN (CUSTOM PROCEDURE TRAY) ×1 IMPLANT
PACK LITHOTOMY IV (CUSTOM PROCEDURE TRAY) IMPLANT
PAD ARMBOARD POSITIONER FOAM (MISCELLANEOUS) ×2 IMPLANT
SOL PREP POV-IOD 4OZ 10% (MISCELLANEOUS) ×1 IMPLANT
SUPPORT SCROTAL MED ADLT STRP (MISCELLANEOUS) IMPLANT
SUT ETHILON 2 0 FS 18 (SUTURE) IMPLANT
SWAB COLLECTION DEVICE MRSA (MISCELLANEOUS) ×1 IMPLANT
SWAB CULTURE ESWAB REG 1ML (MISCELLANEOUS) IMPLANT
TOWEL GREEN STERILE (TOWEL DISPOSABLE) ×1 IMPLANT
WATER STERILE IRR 1000ML POUR (IV SOLUTION) ×1 IMPLANT

## 2023-12-29 NOTE — Transfer of Care (Signed)
 Immediate Anesthesia Transfer of Care Note  Patient: Jose Mays  Procedure(s) Performed: EXPLORATION, SCROTUM (Scrotum)  Patient Location: PACU  Anesthesia Type:General  Level of Consciousness: awake, alert , and oriented  Airway & Oxygen Therapy: Patient Spontanous Breathing and Patient connected to nasal cannula oxygen  Post-op Assessment: Report given to RN and Post -op Vital signs reviewed and stable  Post vital signs: Reviewed and stable  Last Vitals:  Vitals Value Taken Time  BP    Temp 37 C 12/29/23 1703  Pulse 93 12/29/23 1705  Resp 18 12/29/23 1705  SpO2 95 % 12/29/23 1705  Vitals shown include unfiled device data.  Last Pain:  Vitals:   12/29/23 1447  TempSrc: Oral  PainSc: 8          Complications: No notable events documented.

## 2023-12-29 NOTE — H&P (Signed)
 History and Physical    Patient: Jose Mays GEX:[id number] DOB: [birth year] DOA: 12/29/2023 DOS: the patient was seen and examined on 12/29/2023 PCP: Pcp, No  Patient coming from: Home Chief complaint: Chief Complaint  Patient presents with   Abscess   HPI:  Jose Mays is a 28 y.o. male with past medical history  of CAD, Boerhaave syndrome, presenting today with left scrotal swelling pain and drainage since past few days.  Patient does report chills but no fever.  Patient reports tobacco abuse but no alcohol or drugs.  Per report patient has a history of retinitis suppurativa as well as following up with urology in the past.  Scrotal swelling has been progressively increasing since Friday.  ED Course: Pt in ed at bedside  is alert awake oriented is on the phone, cooperative. Vital signs in the ED were notable for the following:  Vitals:   12/29/23 0534 12/29/23 0537 12/29/23 1209 12/29/23 1328  BP: 126/88  (!) 131/57   Pulse: 75  81   Temp: 98 F (36.7 C)   98.2 F (36.8 C)  Resp: 20  18   Height:  5\' 7"  (1.702 m)    Weight:  79.4 kg    SpO2: 96%  100%   TempSrc:    Oral  BMI (Calculated):  27.4    >>ED evaluation thus far shows: - BMP shows sodium 134 normal potassium normal glucose normal kidney function calcium 8.6. -CPK of 132. -Lactic acid of 0.9. -White count of 13.8 hemoglobin of 15.4 platelets of 373 . -Urinalysis shows trace leukocytes otherwise is within normal limits. - Cultures collected in the ED. -CT A/P notes large loculated 8.7 x 4.7 cm left scrotal abscess.   >>While in the ED patient received the following: Medications  iohexol  (OMNIPAQUE ) 350 MG/ML injection 75 mL (75 mLs Intravenous Contrast Given 12/29/23 0953)  sodium chloride  0.9 % bolus 1,000 mL (1,000 mLs Intravenous New Bag/Given 12/29/23 1116)   Review of Systems  Constitutional:  Positive for chills.  Genitourinary:        Left scrotal swelling pain, drainage.   Past Medical History:   Diagnosis Date   Abscess    Acute gastroenteritis 05/13/2017   Cannabinoid hyperemesis syndrome    Hypokalemia 05/23/2016   Pneumomediastinum (HCC) 12/11/2023   Past Surgical History:  Procedure Laterality Date   INCISION AND DRAINAGE ABSCESS N/A 08/11/2022   Procedure: INCISION AND DRAINAGE ABSCESS;  Surgeon: Dustin Gimenez, MD;  Location: [facility] ORS;  Service: Urology;  Laterality: N/A;   TONSILLECTOMY      reports that he has been smoking. He has never used smokeless tobacco. He reports current drug use. Drug: Marijuana. He reports that he does not drink alcohol. No Known Allergies Family History  Problem Relation Age of Onset   Prostate cancer Paternal Uncle    Rheum arthritis Paternal Grandmother    Bladder Cancer Neg Hx    Prior to Admission medications   Not on File                                                                                 Vitals:   12/29/23 0534 12/29/23 0537 12/29/23 1209 12/29/23 1328  BP: 126/88  (!) 131/57   Pulse: 75  81   Resp: 20  18   Temp: 98 F (36.7 C)   98.2 F (36.8 C)  TempSrc:    Oral  SpO2: 96%  100%   Weight:  79.4 kg    Height:  5\' 7"  (1.702 m)     Physical Exam Vitals and nursing note reviewed.  Constitutional:      General: He is not in acute distress. HENT:     Head: Normocephalic and atraumatic.     Right Ear: Hearing normal.     Left Ear: Hearing normal.     Nose: No nasal deformity.     Mouth/Throat:     Lips: Pink.  Eyes:     General: Lids are normal.     Extraocular Movements: Extraocular movements intact.  Cardiovascular:     Rate and Rhythm: Normal rate and regular rhythm.     Heart sounds: Normal heart sounds.  Pulmonary:     Effort: Pulmonary effort is normal.     Breath sounds: Normal breath sounds.  Abdominal:     General: Bowel sounds are normal. There is no distension.     Palpations: Abdomen is soft. There is no mass.     Tenderness: There is no abdominal tenderness.  Musculoskeletal:      Right lower leg: No edema.     Left lower leg: No edema.  Skin:    General: Skin is warm.  Neurological:     General: No focal deficit present.     Mental Status: He is alert and oriented to person, place, and time.     Cranial Nerves: Cranial nerves 2-12 are intact.  Psychiatric:        Speech: Speech normal.     Labs on Admission: I have personally reviewed following labs and imaging studies CBC: Recent Labs  Lab 12/29/23 0758  WBC 13.8*  NEUTROABS 10.6*  HGB 15.4  HCT 46.4  MCV 96.1  PLT 373   Basic Metabolic Panel: Recent Labs  Lab 12/29/23 0758  NA 134*  K 3.7  CL 98  CO2 26  GLUCOSE 96  BUN 10  CREATININE 0.92  CALCIUM 8.6*   GFR: Estimated Creatinine Clearance: 119.6 mL/min (by C-G formula based on SCr of 0.92 mg/dL). Liver Function Tests: No results for input(s): "AST", "ALT", "ALKPHOS", "BILITOT", "PROT", "ALBUMIN" in the last 168 hours. No results for input(s): "LIPASE", "AMYLASE" in the last 168 hours. No results for input(s): "AMMONIA" in the last 168 hours. Coagulation Profile: No results for input(s): "INR", "PROTIME" in the last 168 hours. Cardiac Enzymes: Recent Labs  Lab 12/29/23 0758  CKTOTAL 132   BNP (last 3 results) No results for input(s): "PROBNP" in the last 8760 hours. HbA1C: No results for input(s): "HGBA1C" in the last 72 hours. CBG: No results for input(s): "GLUCAP" in the last 168 hours. Lipid Profile: No results for input(s): "CHOL", "HDL", "LDLCALC", "TRIG", "CHOLHDL", "LDLDIRECT" in the last 72 hours. Thyroid Function Tests: No results for input(s): "TSH", "T4TOTAL", "FREET4", "T3FREE", "THYROIDAB" in the last 72 hours. Anemia Panel: No results for input(s): "VITAMINB12", "FOLATE", "FERRITIN", "TIBC", "IRON", "RETICCTPCT" in the last 72 hours. Urine analysis:    Component Value Date/Time   COLORURINE YELLOW 12/29/2023 1115   APPEARANCEUR CLEAR 12/29/2023 1115   LABSPEC >1.046 (H) 12/29/2023 1115   PHURINE 5.0  12/29/2023 1115   GLUCOSEU NEGATIVE 12/29/2023 1115   HGBUR NEGATIVE 12/29/2023 1115   BILIRUBINUR NEGATIVE 12/29/2023  1115   KETONESUR NEGATIVE 12/29/2023 1115   PROTEINUR 30 (A) 12/29/2023 1115   NITRITE NEGATIVE 12/29/2023 1115   LEUKOCYTESUR TRACE (A) 12/29/2023 1115   Radiological Exams on Admission: CT ABDOMEN PELVIS W CONTRAST Result Date: 12/29/2023 CLINICAL DATA:  Bilateral inguinal abscesses EXAM: CT ABDOMEN AND PELVIS WITH CONTRAST TECHNIQUE: Multidetector CT imaging of the abdomen and pelvis was performed using the standard protocol following bolus administration of intravenous contrast. RADIATION DOSE REDUCTION: This exam was performed according to the departmental dose-optimization program which includes automated exposure control, adjustment of the mA and/or kV according to patient size and/or use of iterative reconstruction technique. CONTRAST:  75mL OMNIPAQUE  IOHEXOL  350 MG/ML SOLN COMPARISON:  Same-day scrotal ultrasound examination, CT abdomen and pelvis dated 12/11/2023 FINDINGS: Lower chest: No focal consolidation or pulmonary nodule in the lung bases. No pleural effusion or pneumothorax demonstrated. Partially imaged heart size is normal. Hepatobiliary: Subtle enhancing 17 mm focus within segment 8 (3:9), unchanged, likely venous malformation or perfusional variation. Scattered subcentimeter hypodensities throughout the liver, unchanged, too small to characterize but likely cysts. No intra or extrahepatic biliary ductal dilation. Normal gallbladder. Pancreas: No focal lesions or main ductal dilation. Spleen: Normal in size without focal abnormality. Adrenals/Urinary Tract: No adrenal nodules. No suspicious renal mass, calculi or hydronephrosis. No focal bladder wall thickening. Stomach/Bowel: Normal appearance of the stomach. No evidence of bowel wall thickening, distention, or inflammatory changes. Normal appendix. Vascular/Lymphatic: No significant vascular findings are present.  Bilateral inguinal lymphadenopathy measuring to 18 mm on the left (3:74). Reproductive: Prostate is unremarkable. Complex fluid collection within the left hemiscrotum measuring 8.7 x 4.7 cm (3:92) results in rightward deviation of the left testicle. Ill-defined soft tissue thickening overlying the mid right inguinal region (3:76), likely corresponds to abscess noted on earlier same day ultrasound examination. Other: No free fluid, fluid collection, or free air. Musculoskeletal: No acute or abnormal lytic or blastic osseous lesions. Scattered areas of subcutaneous soft tissue stranding overlying the bilateral inguinal lymphadenopathy. IMPRESSION: 1. Complex fluid collection within the left hemiscrotum measuring 8.7 x 4.7 cm results in rightward deviation of the left testicle, likely a scrotal abscess, as seen on earlier same day ultrasound examination. 2. Ill-defined soft tissue thickening overlying the mid right inguinal region, likely corresponds to abscess noted on earlier same day ultrasound examination. 3. Bilateral inguinal lymphadenopathy, likely reactive. Electronically Signed   By: Limin  Xu M.D.   On: 12/29/2023 10:39   US  SCROTUM W/DOPPLER Result Date: 12/29/2023 CLINICAL DATA:  Suspected furuncle EXAM: SCROTAL ULTRASOUND DOPPLER ULTRASOUND OF THE TESTICLES TECHNIQUE: Complete ultrasound examination of the testicles, epididymis, and other scrotal structures was performed. Color and spectral Doppler ultrasound were also utilized to evaluate blood flow to the testicles. COMPARISON:  Scrotal ultrasound examination dated 08/09/2022 FINDINGS: Right testicle Measurements: 4.4 x 2.3 x 1.8 cm, 9.4 mL. No mass or microlithiasis visualized. Left testicle Measurements: 4.6 x 3.4 x 2.6 cm, 21.4 mL. No mass or microlithiasis visualized. Asymmetric marked left scrotal soft tissue swelling associated with hyperemia on color Doppler evaluation. Right epididymis:  Normal in size and appearance. Left epididymis:  Normal  in size and appearance. Hydrocele:  None visualized. Varicocele:  None visualized. Pulsed Doppler interrogation of both testes demonstrates normal low resistance arterial and venous waveforms bilaterally. Within the subcutaneous soft tissues of the right inguinal canal is an irregular hypoechoic structure measuring 1.8 x 1.3 x 0.8 cm, which demonstrates increased echogenicity of the surrounding subcutaneous fat and hyperemia. Lateral to the left testicle is  a heterogeneously hypoechoic subcutaneous fluid collection containing irregular internal echogenicities, which measures 3.7 x 2.5 x 2.2 cm. This collection also demonstrates surrounding subcutaneous echogenicity and hyperemia. Asymmetrically enlarged left inguinal lymph nodes measuring up to 1.9 cm in short axis dimension demonstrate loss of normal echogenic hilum. IMPRESSION: 1. Asymmetric marked left scrotal soft tissue swelling associated with hyperemia on color Doppler evaluation, which may be seen in the setting of cellulitis. 2. Complex fluid collections located lateral to the left testicle and within the right inguinal region, likely abscesses. 3. Asymmetrically enlarged left inguinal lymph nodes measuring up to 1.9 cm in short axis dimension demonstrate loss of normal echogenic hilum, which may be reactive. Early suppuration is not excluded. 4. No evidence of testicular torsion. Electronically Signed   By: Limin  Xu M.D.   On: 12/29/2023 09:10   Data Reviewed: Relevant notes from primary care and specialist visits, past discharge summaries as available in EHR, including Care Everywhere . Prior diagnostic testing as pertinent to current admission diagnoses, Updated medications and problem lists for reconciliation .ED course, including vitals, labs, imaging, treatment and response to treatment,Triage notes, nursing and pharmacy notes and ED provider's notes.Notable results as noted in HPI.Discussed case with EDMD/ ED APP/ or Specialty MD on call and as  needed.  Assessment & Plan  >> Left scrotal abscess: - Clean dry dressing. -Patient started on vancomycin and cefepime for MRSA coverage due to recurrence. - Urology and general surgery has been consulted and patient is to remain n.p.o. for procedure.  >>Groin Abscess: - Continue with IV antibiotics currently.  >> Tobacco abuse: Nicotine patch.  DVT prophylaxis:  Heparin to start tonight. Consults:  Urology. Advance Care Planning:    Code Status: Full Code   Family Communication:  None Disposition Plan:  Home Severity of Illness: The appropriate patient status for this patient is INPATIENT. Inpatient status is judged to be reasonable and necessary in order to provide the required intensity of service to ensure the patient's safety. The patient's presenting symptoms, physical exam findings, and initial radiographic and laboratory data in the context of their chronic comorbidities is felt to place them at high risk for further clinical deterioration. Furthermore, it is not anticipated that the patient will be medically stable for discharge from the hospital within 2 midnights of admission.   * I certify that at the point of admission it is my clinical judgment that the patient will require inpatient hospital care spanning beyond 2 midnights from the point of admission due to high intensity of service, high risk for further deterioration and high frequency of surveillance required.*  Unresulted Labs (From admission, onward)     Start     Ordered   12/29/23 1240  HIV Antibody (routine testing w rflx)  (HIV Antibody (Routine testing w reflex) panel)  Once,   URGENT        12/29/23 1239   12/29/23 1240  Hemoglobin A1c  Add-on,   AD        12/29/23 1239   12/29/23 1053  Urine Culture  Once,   URGENT       Question:  Indication  Answer:  Dysuria   12/29/23 1056   12/29/23 0743  Blood culture (routine x 2)  BLOOD CULTURE X 2,   R      12/29/23 0744            Orders Placed  This Encounter  Procedures   Blood culture (routine x 2)   Urine Culture  US  SCROTUM W/DOPPLER   CT ABDOMEN PELVIS W CONTRAST   CBC with Differential   Basic metabolic panel   Urinalysis, Routine w reflex microscopic -Urine, Unspecified Source   CK   HIV Antibody (routine testing w rflx)   Hemoglobin A1c   Diet NPO time specified Except for: Sips with Meds   Apply Cellulitis Care Plan   Vital signs   Notify physician (specify)   Mobility Protocol: No Restrictions RN to initiate protocols based on patient's level of care   Refer to Sidebar Report Refer to ICU, Med-Surg, Progressive, and Step-Down Mobility Protocol Sidebars   Initiate Adult Central Line Maintenance and Catheter Protocol for patients with central line (CVC, PICC, Port, Hemodialysis, Trialysis)   If patient diabetic or glucose greater than 140 notify physician for Sliding Scale Insulin Orders   Intake and Output   Neurovascular checks   Do not place and if present remove PureWick   Oral care per nursing protocol   Initiate Oral Care Protocol   Initiate Carrier Fluid Protocol   RN may order General Admission PRN Orders utilizing "General Admission PRN medications" (through manage orders) for the following patient needs: allergy symptoms (Claritin), cold sores (Carmex), cough (Robitussin DM), eye irritation (Liquifilm Tears), hemorrhoids (Tucks), indigestion (Maalox), minor skin irritation (Hydrocortisone Cream), muscle pain Lovena Rubinstein Gay), nose irritation (saline nasal spray) and sore throat (Chloraseptic spray).   Full code   Consult to urology  9cm scrotal abscess   Consult for [facility] Admission   Consult to hospitalist   vancomycin per pharmacy consult   CeFEPIme (MAXIPIME) per pharmacy consult            Pulse oximetry check with vital signs   Oxygen therapy Mode or (Route): Nasal cannula; Liters Per Minute: 2; Keep O2 saturation between: greater than 92 %   Incentive spirometry   Admit to Inpatient  (patient's expected length of stay will be greater than 2 midnights or inpatient only procedure)    Author: Lavanda Porter, MD 12 pm- 8 pm. [facility]. 12/29/2023 1:35 PM >>Please note for any concern,or critical results after hours past 8pm please contact the [facility] floor coverage provider from 7 PM- 7 AM. For on call review [email or web address], username [email or web address] and PW: your phone number<<

## 2023-12-29 NOTE — Op Note (Signed)
 Preoperative diagnosis:  Scrotal abscess Hidradenitis  Postoperative diagnosis:  Same  Procedure: Incision and drainage of left hemiscrotal abscess  Surgeon: Andrez Banker, MD  Anesthesia: General  Complications: None  Intraoperative findings:  #1: The patient's abscess was located on the anterior aspect of the left hemiscrotum.  In addition there was a small little abscess in the left inferior lateral groin  EBL: 25 cc  Specimens: Gram stain and culture  Indication: Jose Mays is a 28 y.o. patient with long history of hidradenitis presented to the emergency department with a left testicular pain.  CT scan was performed demonstrating a large subcutaneous left hemiscrotal abscess.  After reviewing the management options for treatment, he elected to proceed with the above surgical procedure(s). We have discussed the potential benefits and risks of the procedure, side effects of the proposed treatment, the likelihood of the patient achieving the goals of the procedure, and any potential problems that might occur during the procedure or recuperation. Informed consent has been obtained.  Description of procedure:  Consent was obtained in the preoperative holding area.  He was brought back to the op room and placed on the table in supine position.  General anesthesia was then induced and endotracheal tube was inserted.  He was placed in the dorsolithotomy position and prepped and draped in the routine sterile fashion.  A timeout was subsequently performed.  10 cc of quarter percent Marcaine  was injected into and around the left hemiscrotum.  I then used a 15 blade and made an approximately 3 and half centimeter incision over the abscess.  It was drained and cultures obtained.  I then debrided the scrotal wall slightly.  I incised the track.  I then used the Bovie to establish hemostasis.  I then packed the area with 2 inch Kerlix soaked in Betadine.  I then closed the incision with 4  interrupted vertical mattress incisions using 2-0 nylon.  I left a tail of the Curlex dressing at the superior aspect of the wound.  I also incised the small little abscess on the left inferior lateral aspect of his groin.  First I instilled approximately 3 cc of Marcaine  and then with a 15 blade made a 3 or 4 mm incision and drained the area.  It was too small to pack, so I left it open.  The patient was subsequently awoken, extubated, and returned to the PACU in stable condition.

## 2023-12-29 NOTE — Anesthesia Preprocedure Evaluation (Signed)
Anesthesia Evaluation  Patient identified by MRN, date of birth, ID band Patient awake    Reviewed: Allergy & Precautions, H&P , NPO status , Patient's Chart, lab work & pertinent test results  Airway Mallampati: II   Neck ROM: full    Dental   Pulmonary Current Smoker   breath sounds clear to auscultation       Cardiovascular negative cardio ROS  Rhythm:regular Rate:Normal     Neuro/Psych    GI/Hepatic   Endo/Other    Renal/GU      Musculoskeletal   Abdominal   Peds  Hematology   Anesthesia Other Findings   Reproductive/Obstetrics                             Anesthesia Physical Anesthesia Plan  ASA: 2  Anesthesia Plan: General   Post-op Pain Management:    Induction: Intravenous  PONV Risk Score and Plan: 1 and Ondansetron, Dexamethasone, Midazolam and Treatment may vary due to age or medical condition  Airway Management Planned: LMA  Additional Equipment:   Intra-op Plan:   Post-operative Plan: Extubation in OR  Informed Consent: I have reviewed the patients History and Physical, chart, labs and discussed the procedure including the risks, benefits and alternatives for the proposed anesthesia with the patient or authorized representative who has indicated his/her understanding and acceptance.     Dental advisory given  Plan Discussed with: CRNA, Anesthesiologist and Surgeon  Anesthesia Plan Comments:        Anesthesia Quick Evaluation

## 2023-12-29 NOTE — Progress Notes (Signed)
 Pt arrived to 6 north room 31 alert and oriented x4. Scrotum backed with gauze and sutures. Old scars on righ side of face. Bed in lowest position. Call light in reach. Bed in lowest position. All needs met at this time.

## 2023-12-29 NOTE — ED Notes (Signed)
 Pt transported to CT ?

## 2023-12-29 NOTE — Anesthesia Procedure Notes (Signed)
 Procedure Name: LMA Insertion Date/Time: 12/29/2023 4:14 PM  Performed by: Hershall Lory, CRNAPre-anesthesia Checklist: Patient identified, Emergency Drugs available, Suction available and Patient being monitored Patient Re-evaluated:Patient Re-evaluated prior to induction Oxygen Delivery Method: Circle System Utilized Preoxygenation: Pre-oxygenation with 100% oxygen Induction Type: IV induction Ventilation: Mask ventilation without difficulty LMA: LMA inserted LMA Size: 4.0 Number of attempts: 1 Airway Equipment and Method: Bite block Placement Confirmation: positive ETCO2 Tube secured with: Tape Dental Injury: Teeth and Oropharynx as per pre-operative assessment

## 2023-12-29 NOTE — Consult Note (Signed)
 Jose Mays [birth year]  [id number].    Requesting MD: Tegeler, MD Chief Complaint/Reason for Consult: history of hidradenitis, scrotal abscess  HPI:  Jose Mays is a 28 y/o M with PMH THC abuse, cannabis hyperemesis, hidradenitis suppurativa, and recurrent scrotal abscess who presents with left scrotal pain. States he was diagnosed with HS previously and has a long history of recurrent groin/scrotal abscesses requiring I&D. Saw dermatology as an outpatient previously but sounds like he got lost to follow up. States his scrotal pain started 3 days ago. Also has a suprapubic boil that drained spontaneously at home. Other associated symptoms include chills. He denies fever, nausea, vomiting, or changes in bowel habits.   Surgical history: I&D of scrotal abscess 08/11/2022, denies a history of abdominal surgery Blood thinners: none Allergies: NKDA Substance history: daily cigarette and THC use. Denies IVDU. Denies alcohol use.  ROS: Review of Systems  All other systems reviewed and are negative.   Family History  Problem Relation Age of Onset   Prostate cancer Paternal Uncle    Rheum arthritis Paternal Grandmother    Bladder Cancer Neg Hx     Past Medical History:  Diagnosis Date   Abscess    Acute gastroenteritis 05/13/2017   Cannabinoid hyperemesis syndrome    Hypokalemia 05/23/2016   Pneumomediastinum (HCC) 12/11/2023    Past Surgical History:  Procedure Laterality Date   INCISION AND DRAINAGE ABSCESS N/A 08/11/2022   Procedure: INCISION AND DRAINAGE ABSCESS;  Surgeon: Dustin Gimenez, MD;  Location: [facility] ORS;  Service: Urology;  Laterality: N/A;   TONSILLECTOMY      Social History:  reports that he has been smoking. He has never used smokeless tobacco. He reports current drug use. Drug: Marijuana. He reports that he does not drink alcohol.  Allergies: No Known Allergies  (Not in a hospital admission)    Physical Exam: Blood pressure (!) 131/57, pulse  81, temperature 98.2 F (36.8 C), temperature source Oral, resp. rate 18, height 5\' 7"  (1.702 m), weight 79.4 kg, SpO2 100%. General: cooperative black male, NAD HEENT: head -normocephalic, atraumatic; Eyes: PERRLA, no conjunctival injection, anicteric sclerae Neck- Trachea is midline CV- RRR, no lower extremity edema  Pulm- breathing is non-labored ORA Abd- soft, NT/ND, GU- there is chronic scarring of the mons and inguinal creases bilaterally, there is small amount purulent drainage from bilateral R>L inguinal creases consistent with chronic sinus tracts in the setting of HS, there is no cellulitis or fluctuance in the groin or buttock region. There is erythema, tenderness, and swelling of the scrotum on the left side.  MSK- UE/LE symmetrical, no cyanosis, clubbing, or edema. Neuro- CN II-XII grossly in tact, no paresthesias. Psych- Alert and Oriented x3 with appropriate affect Skin: warm and dry, no rashes or lesions   Results for orders placed or performed during the hospital encounter of 12/29/23 (from the past 48 hours)  CBC with Differential     Status: Abnormal   Collection Time: 12/29/23  7:58 AM  Result Value Ref Range   WBC 13.8 (H) 4.0 - 10.5 K/uL   RBC 4.83 4.22 - 5.81 MIL/uL   Hemoglobin 15.4 13.0 - 17.0 g/dL   HCT 40.9 81.1 - 91.4 %   MCV 96.1 80.0 - 100.0 fL   MCH 31.9 26.0 - 34.0 pg   MCHC 33.2 30.0 - 36.0 g/dL   RDW 78.2 95.6 - 21.3 %   Platelets 373 150 - 400 K/uL   nRBC 0.0 0.0 - 0.2 %  Neutrophils Relative % 78 %   Neutro Abs 10.6 (H) 1.7 - 7.7 K/uL   Lymphocytes Relative 13 %   Lymphs Abs 1.8 0.7 - 4.0 K/uL   Monocytes Relative 9 %   Monocytes Absolute 1.2 (H) 0.1 - 1.0 K/uL   Eosinophils Relative 0 %   Eosinophils Absolute 0.1 0.0 - 0.5 K/uL   Basophils Relative 0 %   Basophils Absolute 0.0 0.0 - 0.1 K/uL   Immature Granulocytes 0 %   Abs Immature Granulocytes 0.05 0.00 - 0.07 K/uL    Comment: Performed at [facility], [address]  [place], [place] [address]  Basic metabolic panel     Status: Abnormal   Collection Time: 12/29/23  7:58 AM  Result Value Ref Range   Sodium 134 (L) 135 - 145 mmol/L   Potassium 3.7 3.5 - 5.1 mmol/L   Chloride 98 98 - 111 mmol/L   CO2 26 22 - 32 mmol/L   Glucose, Bld 96 70 - 99 mg/dL    Comment: Glucose reference range applies only to samples taken after fasting for at least 8 hours.   BUN 10 6 - 20 mg/dL   Creatinine, Ser 6.04 0.61 - 1.24 mg/dL   Calcium 8.6 (L) 8.9 - 10.3 mg/dL   GFR, Estimated >54 >09 mL/min    Comment: (NOTE) Calculated using the CKD-EPI Creatinine Equation (2021)    Anion gap 10 5 - 15    Comment: Performed at [facility], [address] [place], [place] [address]  Lactic acid, plasma     Status: None   Collection Time: 12/29/23  7:58 AM  Result Value Ref Range   Lactic Acid, Venous 0.9 0.5 - 1.9 mmol/L    Comment: Performed at [facility], [address], [place], [place] [address]  CK     Status: None   Collection Time: 12/29/23  7:58 AM  Result Value Ref Range   Total CK 132 49 - 397 U/L    Comment: Performed at [facility], [address] [place], [place] [address]  Urinalysis, Routine w reflex microscopic -Urine, Unspecified Source     Status: Abnormal   Collection Time: 12/29/23 11:15 AM  Result Value Ref Range   Color, Urine YELLOW YELLOW   APPearance CLEAR CLEAR   Specific Gravity, Urine >1.046 (H) 1.005 - 1.030   pH 5.0 5.0 - 8.0   Glucose, UA NEGATIVE NEGATIVE mg/dL   Hgb urine dipstick NEGATIVE NEGATIVE   Bilirubin Urine NEGATIVE NEGATIVE   Ketones, ur NEGATIVE NEGATIVE mg/dL   Protein, ur 30 (A) NEGATIVE mg/dL   Nitrite NEGATIVE NEGATIVE   Leukocytes,Ua TRACE (A) NEGATIVE   RBC / HPF 0-5 0 - 5 RBC/hpf   WBC, UA 21-50 0 - 5 WBC/hpf   Bacteria, UA NONE SEEN NONE SEEN   Squamous Epithelial / HPF 0-5 0 - 5 /HPF   Mucus PRESENT     Comment: Performed at [facility], [address], [place], [place] [address]   CT  ABDOMEN PELVIS W CONTRAST Result Date: 12/29/2023 CLINICAL DATA:  Bilateral inguinal abscesses EXAM: CT ABDOMEN AND PELVIS WITH CONTRAST TECHNIQUE: Multidetector CT imaging of the abdomen and pelvis was performed using the standard protocol following bolus administration of intravenous contrast. RADIATION DOSE REDUCTION: This exam was performed according to the departmental dose-optimization program which includes automated exposure control, adjustment of the mA and/or kV according to patient size and/or use of iterative  reconstruction technique. CONTRAST:  75mL OMNIPAQUE  IOHEXOL  350 MG/ML SOLN COMPARISON:  Same-day scrotal ultrasound examination, CT abdomen and pelvis dated 12/11/2023 FINDINGS: Lower chest: No focal consolidation or pulmonary nodule in the lung bases. No pleural effusion or pneumothorax demonstrated. Partially imaged heart size is normal. Hepatobiliary: Subtle enhancing 17 mm focus within segment 8 (3:9), unchanged, likely venous malformation or perfusional variation. Scattered subcentimeter hypodensities throughout the liver, unchanged, too small to characterize but likely cysts. No intra or extrahepatic biliary ductal dilation. Normal gallbladder. Pancreas: No focal lesions or main ductal dilation. Spleen: Normal in size without focal abnormality. Adrenals/Urinary Tract: No adrenal nodules. No suspicious renal mass, calculi or hydronephrosis. No focal bladder wall thickening. Stomach/Bowel: Normal appearance of the stomach. No evidence of bowel wall thickening, distention, or inflammatory changes. Normal appendix. Vascular/Lymphatic: No significant vascular findings are present. Bilateral inguinal lymphadenopathy measuring to 18 mm on the left (3:74). Reproductive: Prostate is unremarkable. Complex fluid collection within the left hemiscrotum measuring 8.7 x 4.7 cm (3:92) results in rightward deviation of the left testicle. Ill-defined soft tissue thickening overlying the mid right inguinal  region (3:76), likely corresponds to abscess noted on earlier same day ultrasound examination. Other: No free fluid, fluid collection, or free air. Musculoskeletal: No acute or abnormal lytic or blastic osseous lesions. Scattered areas of subcutaneous soft tissue stranding overlying the bilateral inguinal lymphadenopathy. IMPRESSION: 1. Complex fluid collection within the left hemiscrotum measuring 8.7 x 4.7 cm results in rightward deviation of the left testicle, likely a scrotal abscess, as seen on earlier same day ultrasound examination. 2. Ill-defined soft tissue thickening overlying the mid right inguinal region, likely corresponds to abscess noted on earlier same day ultrasound examination. 3. Bilateral inguinal lymphadenopathy, likely reactive. Electronically Signed   By: Limin  Xu M.D.   On: 12/29/2023 10:39   US  SCROTUM W/DOPPLER Result Date: 12/29/2023 CLINICAL DATA:  Suspected furuncle EXAM: SCROTAL ULTRASOUND DOPPLER ULTRASOUND OF THE TESTICLES TECHNIQUE: Complete ultrasound examination of the testicles, epididymis, and other scrotal structures was performed. Color and spectral Doppler ultrasound were also utilized to evaluate blood flow to the testicles. COMPARISON:  Scrotal ultrasound examination dated 08/09/2022 FINDINGS: Right testicle Measurements: 4.4 x 2.3 x 1.8 cm, 9.4 mL. No mass or microlithiasis visualized. Left testicle Measurements: 4.6 x 3.4 x 2.6 cm, 21.4 mL. No mass or microlithiasis visualized. Asymmetric marked left scrotal soft tissue swelling associated with hyperemia on color Doppler evaluation. Right epididymis:  Normal in size and appearance. Left epididymis:  Normal in size and appearance. Hydrocele:  None visualized. Varicocele:  None visualized. Pulsed Doppler interrogation of both testes demonstrates normal low resistance arterial and venous waveforms bilaterally. Within the subcutaneous soft tissues of the right inguinal canal is an irregular hypoechoic structure measuring  1.8 x 1.3 x 0.8 cm, which demonstrates increased echogenicity of the surrounding subcutaneous fat and hyperemia. Lateral to the left testicle is a heterogeneously hypoechoic subcutaneous fluid collection containing irregular internal echogenicities, which measures 3.7 x 2.5 x 2.2 cm. This collection also demonstrates surrounding subcutaneous echogenicity and hyperemia. Asymmetrically enlarged left inguinal lymph nodes measuring up to 1.9 cm in short axis dimension demonstrate loss of normal echogenic hilum. IMPRESSION: 1. Asymmetric marked left scrotal soft tissue swelling associated with hyperemia on color Doppler evaluation, which may be seen in the setting of cellulitis. 2. Complex fluid collections located lateral to the left testicle and within the right inguinal region, likely abscesses. 3. Asymmetrically enlarged left inguinal lymph nodes measuring up to 1.9 cm in short axis dimension  demonstrate loss of normal echogenic hilum, which may be reactive. Early suppuration is not excluded. 4. No evidence of testicular torsion. Electronically Signed   By: Limin  Xu M.D.   On: 12/29/2023 09:10      Assessment/Plan 28 y/o M with left scrotal abscess in the setting of HS.  Patient is afebrile and hemodynamically stable. WBC 13.8. CT scan confirms clinical suspicion for scrotal abscess, which is being surgically drained by urology today. Patient has a history consistent with possible R groin/SP abscess that is now spontaneously draining. No other acute general surgery needs. We will sign off. Recommend outpatient follow up with dermatology for more chronic management of HS. Recommended smoking cessation.    I reviewed nursing notes, ED provider notes, Consultant urology notes, hospitalist notes, last 24 h vitals and pain scores, last 48 h intake and output, last 24 h labs and trends, and last 24 h imaging results.  [place] Breath, [facility] 12/29/2023, 2:05 PM Please see Amion for pager  number during day hours 7:00am-4:30pm or 7:00am -11:30am on weekends

## 2023-12-29 NOTE — Progress Notes (Signed)
 The patient is now status post drainage of the abscess in his left hemiscrotum.  I recommend that 2-1/2 inches of dressing be removed from the incision daily until it is completely removed.  This can be started on 12/31/2023.  The patient must stop smoking, this will certainly reduce his incidence of infection and abscess with the hidradenitis.  I have also recommended that the patient follow-up with [facility] dermatology, I left a contact number for him in the discharge instructions.  Hopefully they will be able to minimize his recurrences in the future.  He will follow-up with us  in 2 weeks to have his sutures removed.

## 2023-12-29 NOTE — Discharge Instructions (Signed)
 Starting on June 4 remove 2-1/2 inches of packing daily.  Call to schedule appointment with dermatology to discuss treatment options.  [facility]  [address] [place], [place] [address] [phone]

## 2023-12-29 NOTE — Progress Notes (Signed)
   12/29/23 1756  Vitals  Temp 97.8 F (36.6 C)  Temp Source Oral  BP 127/79  MAP (mmHg) 92  BP Location Left Arm  BP Method Automatic  Patient Position (if appropriate) Lying  Pulse Rate 86  Pulse Rate Source Monitor  Resp 16  Level of Consciousness  Level of Consciousness Alert  MEWS COLOR  MEWS Score Color Green  Oxygen Therapy  SpO2 100 %  O2 Device Room Air  Pain Assessment  Pain Scale 0-10  Pain Score 0  MEWS Score  MEWS Temp 0  MEWS Systolic 0  MEWS Pulse 0  MEWS RR 0  MEWS LOC 0  MEWS Score 0

## 2023-12-29 NOTE — ED Provider Notes (Signed)
  EMERGENCY DEPARTMENT AT [facility] Provider Note   CSN: [id number] Arrival date & time: 12/29/23  2952     History  Chief Complaint  Patient presents with   Abscess    Jose Mays is a 28 y.o. male presents emergency department with a chief complaint of groin abscesses.  Patient states that he has an extensive history of getting abscesses in his groin area with many previous instances of incision and drainage and antibiotic use.  Patient states that this most recent abscess started on Friday and has been increasing in size and has been painful.  Patient states he may have had 1 instance of chills at home, but denies known fever.  Denies nausea, vomiting.  Patient denies open wound or reason for abscess forming, denies IV drug use.  Per chart review patient has a history of hidradenitis suppurativa with multiple abscess incision and drainages as well as following up with urology.    Abscess Associated symptoms: no fever, no headaches, no nausea and no vomiting       Home Medications Prior to Admission medications   Medication Sig Start Date End Date Taking? Authorizing Provider  acetaminophen  (TYLENOL ) 500 MG tablet Take 1,000 mg by mouth every 6 (six) hours as needed for moderate pain (pain score 4-6), fever or headache.   Yes [provider]  ibuprofen  (ADVIL ) 200 MG tablet Take 400 mg by mouth every 6 (six) hours as needed for moderate pain (pain score 4-6) or fever.   Yes [provider]      Allergies    Patient has no known allergies.    Review of Systems   Review of Systems  Constitutional:  Positive for chills. Negative for diaphoresis and fever.  Cardiovascular:  Negative for chest pain.  Gastrointestinal:  Negative for abdominal pain, diarrhea, nausea and vomiting.  Genitourinary:  Positive for genital sores, scrotal swelling and testicular pain. Negative for dysuria and hematuria.  Skin:  Positive for wound. Negative for  rash.  Neurological:  Negative for dizziness, speech difficulty, weakness and headaches.  Psychiatric/Behavioral:  Negative for confusion.     Physical Exam Updated Vital Signs BP 126/80   Pulse 68   Temp 98.3 F (36.8 C) (Oral)   Resp 14   Ht 5\' 7"  (1.702 m)   Wt 79.4 kg   SpO2 97%   BMI 27.41 kg/m  Physical Exam Vitals and nursing note reviewed.  Constitutional:      General: He is awake. He is not in acute distress.    Appearance: Normal appearance. He is not ill-appearing.  HENT:     Head: Normocephalic and atraumatic.  Eyes:     General: No scleral icterus.    Extraocular Movements: Extraocular movements intact.  Pulmonary:     Effort: Pulmonary effort is normal. No respiratory distress.  Abdominal:     General: Abdomen is flat. There is no distension.     Palpations: Abdomen is soft.     Tenderness: There is no abdominal tenderness. There is no guarding.  Genitourinary:    Penis: Normal and circumcised.      Comments: Patient has multiple abscesses of his groin area. Patient has a small abscess that is actively draining above his penis in his right and left groin area, patient has a third abscess present on his left scrotum that is golf-ball size. Left testicle is swollen and very tender to touch, left testicle abscess is not actively draining.  Skin:  General: Skin is warm.     Capillary Refill: Capillary refill takes less than 2 seconds.  Neurological:     General: No focal deficit present.     Mental Status: He is alert and oriented to person, place, and time.     Cranial Nerves: No cranial nerve deficit.     Sensory: No sensory deficit.     Motor: No weakness.  Psychiatric:        Mood and Affect: Mood normal.        Behavior: Behavior normal. Behavior is cooperative.     ED Results / Procedures / Treatments   Labs (all labs ordered are listed, but only abnormal results are displayed) Labs Reviewed  CBC WITH DIFFERENTIAL/PLATELET - Abnormal; Notable  for the following components:      Result Value   WBC 13.8 (*)    Neutro Abs 10.6 (*)    Monocytes Absolute 1.2 (*)    All other components within normal limits  BASIC METABOLIC PANEL WITH GFR - Abnormal; Notable for the following components:   Sodium 134 (*)    Calcium 8.6 (*)    All other components within normal limits  URINALYSIS, ROUTINE W REFLEX MICROSCOPIC - Abnormal; Notable for the following components:   Specific Gravity, Urine >1.046 (*)    Protein, ur 30 (*)    Leukocytes,Ua TRACE (*)    All other components within normal limits  HEMOGLOBIN A1C - Abnormal; Notable for the following components:   Hgb A1c MFr Bld 4.7 (*)    All other components within normal limits  CULTURE, BLOOD (ROUTINE X 2)  CULTURE, BLOOD (ROUTINE X 2)  URINE CULTURE  LACTIC ACID, PLASMA  CK  HIV ANTIBODY (ROUTINE TESTING W REFLEX)  GC/CHLAMYDIA PROBE AMP ([place]) NOT AT [facility]    EKG None  Radiology CT ABDOMEN PELVIS W CONTRAST Result Date: 12/29/2023 CLINICAL DATA:  Bilateral inguinal abscesses EXAM: CT ABDOMEN AND PELVIS WITH CONTRAST TECHNIQUE: Multidetector CT imaging of the abdomen and pelvis was performed using the standard protocol following bolus administration of intravenous contrast. RADIATION DOSE REDUCTION: This exam was performed according to the departmental dose-optimization program which includes automated exposure control, adjustment of the mA and/or kV according to patient size and/or use of iterative reconstruction technique. CONTRAST:  75mL OMNIPAQUE  IOHEXOL  350 MG/ML SOLN COMPARISON:  Same-day scrotal ultrasound examination, CT abdomen and pelvis dated 12/11/2023 FINDINGS: Lower chest: No focal consolidation or pulmonary nodule in the lung bases. No pleural effusion or pneumothorax demonstrated. Partially imaged heart size is normal. Hepatobiliary: Subtle enhancing 17 mm focus within segment 8 (3:9), unchanged, likely venous malformation or perfusional variation. Scattered  subcentimeter hypodensities throughout the liver, unchanged, too small to characterize but likely cysts. No intra or extrahepatic biliary ductal dilation. Normal gallbladder. Pancreas: No focal lesions or main ductal dilation. Spleen: Normal in size without focal abnormality. Adrenals/Urinary Tract: No adrenal nodules. No suspicious renal mass, calculi or hydronephrosis. No focal bladder wall thickening. Stomach/Bowel: Normal appearance of the stomach. No evidence of bowel wall thickening, distention, or inflammatory changes. Normal appendix. Vascular/Lymphatic: No significant vascular findings are present. Bilateral inguinal lymphadenopathy measuring to 18 mm on the left (3:74). Reproductive: Prostate is unremarkable. Complex fluid collection within the left hemiscrotum measuring 8.7 x 4.7 cm (3:92) results in rightward deviation of the left testicle. Ill-defined soft tissue thickening overlying the mid right inguinal region (3:76), likely corresponds to abscess noted on earlier same day ultrasound examination. Other: No free fluid, fluid collection, or free  air. Musculoskeletal: No acute or abnormal lytic or blastic osseous lesions. Scattered areas of subcutaneous soft tissue stranding overlying the bilateral inguinal lymphadenopathy. IMPRESSION: 1. Complex fluid collection within the left hemiscrotum measuring 8.7 x 4.7 cm results in rightward deviation of the left testicle, likely a scrotal abscess, as seen on earlier same day ultrasound examination. 2. Ill-defined soft tissue thickening overlying the mid right inguinal region, likely corresponds to abscess noted on earlier same day ultrasound examination. 3. Bilateral inguinal lymphadenopathy, likely reactive. Electronically Signed   By: Limin  Xu M.D.   On: 12/29/2023 10:39   US  SCROTUM W/DOPPLER Result Date: 12/29/2023 CLINICAL DATA:  Suspected furuncle EXAM: SCROTAL ULTRASOUND DOPPLER ULTRASOUND OF THE TESTICLES TECHNIQUE: Complete ultrasound examination  of the testicles, epididymis, and other scrotal structures was performed. Color and spectral Doppler ultrasound were also utilized to evaluate blood flow to the testicles. COMPARISON:  Scrotal ultrasound examination dated 08/09/2022 FINDINGS: Right testicle Measurements: 4.4 x 2.3 x 1.8 cm, 9.4 mL. No mass or microlithiasis visualized. Left testicle Measurements: 4.6 x 3.4 x 2.6 cm, 21.4 mL. No mass or microlithiasis visualized. Asymmetric marked left scrotal soft tissue swelling associated with hyperemia on color Doppler evaluation. Right epididymis:  Normal in size and appearance. Left epididymis:  Normal in size and appearance. Hydrocele:  None visualized. Varicocele:  None visualized. Pulsed Doppler interrogation of both testes demonstrates normal low resistance arterial and venous waveforms bilaterally. Within the subcutaneous soft tissues of the right inguinal canal is an irregular hypoechoic structure measuring 1.8 x 1.3 x 0.8 cm, which demonstrates increased echogenicity of the surrounding subcutaneous fat and hyperemia. Lateral to the left testicle is a heterogeneously hypoechoic subcutaneous fluid collection containing irregular internal echogenicities, which measures 3.7 x 2.5 x 2.2 cm. This collection also demonstrates surrounding subcutaneous echogenicity and hyperemia. Asymmetrically enlarged left inguinal lymph nodes measuring up to 1.9 cm in short axis dimension demonstrate loss of normal echogenic hilum. IMPRESSION: 1. Asymmetric marked left scrotal soft tissue swelling associated with hyperemia on color Doppler evaluation, which may be seen in the setting of cellulitis. 2. Complex fluid collections located lateral to the left testicle and within the right inguinal region, likely abscesses. 3. Asymmetrically enlarged left inguinal lymph nodes measuring up to 1.9 cm in short axis dimension demonstrate loss of normal echogenic hilum, which may be reactive. Early suppuration is not excluded. 4. No  evidence of testicular torsion. Electronically Signed   By: Limin  Xu M.D.   On: 12/29/2023 09:10    Procedures Procedures    Medications Ordered in ED Medications  lactated ringers  infusion (has no administration in time range)  acetaminophen  (TYLENOL ) tablet 650 mg ( Oral MAR Hold 12/29/23 1418)    Or  acetaminophen  (TYLENOL ) suppository 650 mg ( Rectal MAR Hold 12/29/23 1418)  oxyCODONE  (Oxy IR/ROXICODONE ) immediate release tablet 5 mg ( Oral MAR Hold 12/29/23 1418)  morphine  (PF) 2 MG/ML injection 2 mg ( Intravenous MAR Hold 12/29/23 1418)  polyethylene glycol (MIRALAX  / GLYCOLAX ) packet 17 g ( Oral MAR Hold 12/29/23 1418)  ondansetron  (ZOFRAN ) tablet 4 mg ( Oral MAR Hold 12/29/23 1418)    Or  ondansetron  (ZOFRAN ) injection 4 mg ( Intravenous MAR Hold 12/29/23 1418)  nicotine (NICODERM CQ - dosed in mg/24 hours) patch 14 mg ( Transdermal MAR Hold 12/29/23 1418)  heparin injection 5,000 Units ( Subcutaneous Automatically Held 01/06/24 2200)  docusate sodium (COLACE) capsule 100 mg ( Oral Automatically Held 01/06/24 2200)  pantoprazole  (PROTONIX ) injection 40 mg ( Intravenous Automatically Held  01/06/24 2200)  ceFEPIme (MAXIPIME) 2 g in sodium chloride  0.9 % 100 mL IVPB ( Intravenous Automatically Held 01/06/24 2115)  vancomycin (VANCOREADY) IVPB 2000 mg/400 mL (2,000 mg Intravenous New Bag/Given 12/29/23 1457)  lactated ringers  infusion ( Intravenous New Bag/Given 12/29/23 1452)  iohexol  (OMNIPAQUE ) 350 MG/ML injection 75 mL (75 mLs Intravenous Contrast Given 12/29/23 0953)  sodium chloride  0.9 % bolus 1,000 mL (0 mLs Intravenous Stopped 12/29/23 1325)  chlorhexidine  (PERIDEX ) 0.12 % solution 15 mL (15 mLs Mouth/Throat Given 12/29/23 1451)    Or  Oral care mouth rinse ( Mouth Rinse See Alternative 12/29/23 1451)    ED Course/ Medical Decision Making/ A&P    Patient presents to the ED for concern of groin abscess, this involves an extensive number of treatment options, and is a complaint that carries with it a  high risk of complications and morbidity.  The differential diagnosis includes abscess, cellulitis, hidradenitis suppurativa, testicular torsion, sexually-transmitted disease, rash, ingrown hair, necrotizing fasciitis, Fournier's gangrene, etc.   Co morbidities that complicate the patient evaluation  History of multiple groin/buttock abscesses, history of hidradenitis suppurativa   Additional history obtained:  Additional history obtained from Past Admission   External records from outside source obtained and reviewed including past admission where patient was admitted for testicular abscess, urology consulted, patient mated   Lab Tests:  I Ordered, and personally interpreted labs.  The pertinent results include:  CBC - WBC 13.8, BMP-sodium 134, calcium 8.6, lactic acid-0.9   Imaging Studies ordered:  I ordered imaging studies including CT abdomen pelvis with contrast, ultrasound scrotum with Doppler I independently visualized and interpreted imaging which showed: CT abdomen/pelvis:  IMPRESSION:  1. Complex fluid collection within the left hemiscrotum measuring  8.7 x 4.7 cm results in rightward deviation of the left testicle,  likely a scrotal abscess, as seen on earlier same day ultrasound  examination.  2. Ill-defined soft tissue thickening overlying the mid right  inguinal region, likely corresponds to abscess noted on earlier same  day ultrasound examination.  3. Bilateral inguinal lymphadenopathy, likely reactive.  Ultrasound scrotum with Doppler: IMPRESSION:  1. Asymmetric marked left scrotal soft tissue swelling associated  with hyperemia on color Doppler evaluation, which may be seen in the  setting of cellulitis.  2. Complex fluid collections located lateral to the left testicle  and within the right inguinal region, likely abscesses.  3. Asymmetrically enlarged left inguinal lymph nodes measuring up to  1.9 cm in short axis dimension demonstrate loss of normal  echogenic  hilum, which may be reactive. Early suppuration is not excluded.  4. No evidence of testicular torsion.       I agree with the radiologist interpretation   Consultations Obtained:  I requested consultation with the urology, hospitalist, and general surgery teams and discussed lab and imaging findings as well as pertinent plan - they recommend: Admit to the hospital for intraoperative incision and drainage by urology   Problem List / ED Course:  Patient presents with multiple groin abscesses as well as a left testicular abscess Due to patient history of multiple abscesses as well as history of hidradenitis suppurativa, lab workup as well as imaging ordered Lab workup significant for leukocytosis with a white blood cell count of 13.8, vital stable, lactic acid within normal limits, doubt sepsis at this time CT abdomen pelvis and scrotal ultrasound ordered, significant for 8.7 x 4.7 cm left scrotal abscess, confirmed by ultrasound Urology consulted who recommended intraoperative incision and drainage and admit Patient admitted  via medicine with consult to urology as well as general surgery Bed ordered   Reevaluation:  After the interventions noted above, I reevaluated the patient and found that they have :stayed the same   Social Determinants of Health:  none   Dispostion:  After consideration of the diagnostic results and the patients response to treatment, I feel that the patent would benefit from admission and intraoperative incision and drainage by urology.  Click here for ABCD2, HEART and other calculatorsREFRESH Note before signing :1}                              Medical Decision Making Amount and/or Complexity of Data Reviewed Labs: ordered. Radiology: ordered.  Risk Prescription drug management. Decision regarding hospitalization.          Final Clinical Impression(s) / ED Diagnoses Final diagnoses:  None    Rx / DC Orders ED Discharge  Orders     None         Fonda Hymen, PA-C 12/29/23 1612    Tegeler, Marine Sia, MD 12/31/23 [phone]

## 2023-12-29 NOTE — ED Notes (Signed)
 Pt transported to [place]

## 2023-12-29 NOTE — ED Triage Notes (Signed)
 C/o boil in groin area onset Fri, states he has a history of boils .

## 2023-12-29 NOTE — Progress Notes (Signed)
 Pharmacy Antibiotic Note  Tracer Dooling is a 28 y.o. male admitted on 12/29/2023 with concern for scrotal abscess.  CT abd/pelv with fluid collection in L hemiscrotum (8.7 x 4.7 cm) and concern for scrotal abscess. Surgical drain per urology on 6/2. Pharmacy has been consulted for vancomycin dosing.  Plan: Vancomycin 1500mg  q12h (eAUC 544, Scr 0.92)  F/u renal function, infectious work-up including surgical cultures, and length of therapy Vancomycin levels as needed   Height: 5\' 7"  (170.2 cm) Weight: 79.4 kg (175 lb) IBW/kg (Calculated) : 66.1  Temp (24hrs), Avg:98 F (36.7 C), Min:98 F (36.7 C), Max:98 F (36.7 C)  Recent Labs  Lab 12/29/23 0758  WBC 13.8*  CREATININE 0.92  LATICACIDVEN 0.9    Estimated Creatinine Clearance: 119.6 mL/min (by C-G formula based on SCr of 0.92 mg/dL).    No Known Allergies  Antimicrobials this admission: Cefepime 6/2 > Vancomycin 6/2 >  Microbiology results: 6/2 surg cx: 6/2 ucx: 6/2 bcx:  Thank you for allowing pharmacy to be a part of this patient's care.  Fonda Hymen 12/29/2023 1:08 PM

## 2023-12-29 NOTE — Consult Note (Signed)
 Urology Consult Note   Requesting Attending Physician:  Tegeler, Marine Sia, * Service Providing Consult: Urology  Consulting Attending: Dr. Dulcy Gibney   Reason for Consult:    HPI: Jose Mays is seen in consultation for reasons noted above at the request of Tegeler, Marine Sia, * Patient is a 28 year old male with PMH significant for hidradenitis suppurativa with recurrent scrotal abscess.  He has been cared for in the past by Dr. Dustin Gimenez and I can see multiple surgical interventions since 2017.  Of note he was just in the emergency department on 5/18 for cannabis hyperemesis syndrome.    On my arrival patient was alert, oriented, in no distress.  His left scrotal abscess was exquisitely tender and he did not tolerate palpation.  There was evidence of a resolving carbuncle in the right suprapubic area which he said ruptured a few days ago.  We reviewed the case and plan and he is very familiar with the interventions.  He is amenable to proceeding to the operating room later this afternoon has been n.p.o. since around 10 PM last night.  ------------------  Assessment:  28 y.o. male with loculated    Recommendations: # Hidradenitis suppurativa # Left scrotal abscess  CT A/P notes large loculated 8.7 x 4.7 cm left scrotal abscess.  Patient to remain n.p.o. and proceed to the OR with Dr. Dulcy Gibney tentatively around 330 this afternoon.  Urology will follow.  Case and plan discussed with Dr. Dulcy Gibney  Past Medical History: Past Medical History:  Diagnosis Date   Abscess    Cannabinoid hyperemesis syndrome     Past Surgical History:  Past Surgical History:  Procedure Laterality Date   INCISION AND DRAINAGE ABSCESS N/A 08/11/2022   Procedure: INCISION AND DRAINAGE ABSCESS;  Surgeon: Dustin Gimenez, MD;  Location: [facility] ORS;  Service: Urology;  Laterality: N/A;   TONSILLECTOMY      Medication: No current facility-administered medications for this encounter.    No current outpatient medications on file.    Allergies: No Known Allergies  Social History: Social History   Tobacco Use   Smoking status: Every Day   Smokeless tobacco: Never  Substance Use Topics   Alcohol use: No   Drug use: Yes    Types: Marijuana    Family History Family History  Problem Relation Age of Onset   Prostate cancer Paternal Uncle    Rheum arthritis Paternal Grandmother    Bladder Cancer Neg Hx     ROS   Objective   Vital signs in last 24 hours: BP 126/88 (BP Location: Right Arm)   Pulse 75   Temp 98 F (36.7 C)   Resp 20   Ht 5\' 7"  (1.702 m)   Wt 79.4 kg   SpO2 96%   BMI 27.41 kg/m   Physical Exam General: A&O, resting, appropriate HEENT: [place]/AT Pulmonary: Normal work of breathing Cardiovascular: no cyanosis Abdomen: Soft, NTTP, nondistended GU: Visibly erythematous fluid collection on the left hemiscrotum.  No drainage. Neuro: Appropriate, no focal neurological deficits  Most Recent Labs: Lab Results  Component Value Date   WBC 13.8 (H) 12/29/2023   HGB 15.4 12/29/2023   HCT 46.4 12/29/2023   PLT 373 12/29/2023    Lab Results  Component Value Date   NA 134 (L) 12/29/2023   K 3.7 12/29/2023   CL 98 12/29/2023   CO2 26 12/29/2023   BUN 10 12/29/2023   CREATININE 0.92 12/29/2023   CALCIUM 8.6 (L) 12/29/2023   MG 2.0 12/12/2023  PHOS 3.9 12/12/2023    No results found for: "INR", "APTT"   Urine Culture: @[id number] (laburin,org,[id number])@   IMAGING: CT ABDOMEN PELVIS W CONTRAST Result Date: 12/29/2023 CLINICAL DATA:  Bilateral inguinal abscesses EXAM: CT ABDOMEN AND PELVIS WITH CONTRAST TECHNIQUE: Multidetector CT imaging of the abdomen and pelvis was performed using the standard protocol following bolus administration of intravenous contrast. RADIATION DOSE REDUCTION: This exam was performed according to the departmental dose-optimization program which includes automated exposure control, adjustment of the mA and/or  kV according to patient size and/or use of iterative reconstruction technique. CONTRAST:  75mL OMNIPAQUE  IOHEXOL  350 MG/ML SOLN COMPARISON:  Same-day scrotal ultrasound examination, CT abdomen and pelvis dated 12/11/2023 FINDINGS: Lower chest: No focal consolidation or pulmonary nodule in the lung bases. No pleural effusion or pneumothorax demonstrated. Partially imaged heart size is normal. Hepatobiliary: Subtle enhancing 17 mm focus within segment 8 (3:9), unchanged, likely venous malformation or perfusional variation. Scattered subcentimeter hypodensities throughout the liver, unchanged, too small to characterize but likely cysts. No intra or extrahepatic biliary ductal dilation. Normal gallbladder. Pancreas: No focal lesions or main ductal dilation. Spleen: Normal in size without focal abnormality. Adrenals/Urinary Tract: No adrenal nodules. No suspicious renal mass, calculi or hydronephrosis. No focal bladder wall thickening. Stomach/Bowel: Normal appearance of the stomach. No evidence of bowel wall thickening, distention, or inflammatory changes. Normal appendix. Vascular/Lymphatic: No significant vascular findings are present. Bilateral inguinal lymphadenopathy measuring to 18 mm on the left (3:74). Reproductive: Prostate is unremarkable. Complex fluid collection within the left hemiscrotum measuring 8.7 x 4.7 cm (3:92) results in rightward deviation of the left testicle. Ill-defined soft tissue thickening overlying the mid right inguinal region (3:76), likely corresponds to abscess noted on earlier same day ultrasound examination. Other: No free fluid, fluid collection, or free air. Musculoskeletal: No acute or abnormal lytic or blastic osseous lesions. Scattered areas of subcutaneous soft tissue stranding overlying the bilateral inguinal lymphadenopathy. IMPRESSION: 1. Complex fluid collection within the left hemiscrotum measuring 8.7 x 4.7 cm results in rightward deviation of the left testicle, likely a  scrotal abscess, as seen on earlier same day ultrasound examination. 2. Ill-defined soft tissue thickening overlying the mid right inguinal region, likely corresponds to abscess noted on earlier same day ultrasound examination. 3. Bilateral inguinal lymphadenopathy, likely reactive. Electronically Signed   By: Limin  Xu M.D.   On: 12/29/2023 10:39   US  SCROTUM W/DOPPLER Result Date: 12/29/2023 CLINICAL DATA:  Suspected furuncle EXAM: SCROTAL ULTRASOUND DOPPLER ULTRASOUND OF THE TESTICLES TECHNIQUE: Complete ultrasound examination of the testicles, epididymis, and other scrotal structures was performed. Color and spectral Doppler ultrasound were also utilized to evaluate blood flow to the testicles. COMPARISON:  Scrotal ultrasound examination dated 08/09/2022 FINDINGS: Right testicle Measurements: 4.4 x 2.3 x 1.8 cm, 9.4 mL. No mass or microlithiasis visualized. Left testicle Measurements: 4.6 x 3.4 x 2.6 cm, 21.4 mL. No mass or microlithiasis visualized. Asymmetric marked left scrotal soft tissue swelling associated with hyperemia on color Doppler evaluation. Right epididymis:  Normal in size and appearance. Left epididymis:  Normal in size and appearance. Hydrocele:  None visualized. Varicocele:  None visualized. Pulsed Doppler interrogation of both testes demonstrates normal low resistance arterial and venous waveforms bilaterally. Within the subcutaneous soft tissues of the right inguinal canal is an irregular hypoechoic structure measuring 1.8 x 1.3 x 0.8 cm, which demonstrates increased echogenicity of the surrounding subcutaneous fat and hyperemia. Lateral to the left testicle is a heterogeneously hypoechoic subcutaneous fluid collection containing irregular internal echogenicities, which measures 3.7  x 2.5 x 2.2 cm. This collection also demonstrates surrounding subcutaneous echogenicity and hyperemia. Asymmetrically enlarged left inguinal lymph nodes measuring up to 1.9 cm in short axis dimension  demonstrate loss of normal echogenic hilum. IMPRESSION: 1. Asymmetric marked left scrotal soft tissue swelling associated with hyperemia on color Doppler evaluation, which may be seen in the setting of cellulitis. 2. Complex fluid collections located lateral to the left testicle and within the right inguinal region, likely abscesses. 3. Asymmetrically enlarged left inguinal lymph nodes measuring up to 1.9 cm in short axis dimension demonstrate loss of normal echogenic hilum, which may be reactive. Early suppuration is not excluded. 4. No evidence of testicular torsion. Electronically Signed   By: Limin  Xu M.D.   On: 12/29/2023 09:10    ------  Alla Ar, NP Pager: [phone]   Please contact the urology consult pager with any further questions/concerns.

## 2023-12-30 ENCOUNTER — Encounter (HOSPITAL_COMMUNITY): Payer: Self-pay | Admitting: Urology

## 2023-12-30 DIAGNOSIS — L732 Hidradenitis suppurativa: Secondary | ICD-10-CM

## 2023-12-30 DIAGNOSIS — Z720 Tobacco use: Secondary | ICD-10-CM

## 2023-12-30 LAB — RENAL FUNCTION PANEL
Albumin: 3.4 g/dL — ABNORMAL LOW (ref 3.5–5.0)
Anion gap: 9 (ref 5–15)
BUN: 8 mg/dL (ref 6–20)
CO2: 26 mmol/L (ref 22–32)
Calcium: 8.7 mg/dL — ABNORMAL LOW (ref 8.9–10.3)
Chloride: 98 mmol/L (ref 98–111)
Creatinine, Ser: 0.84 mg/dL (ref 0.61–1.24)
GFR, Estimated: >60 mL/min (ref >60)
Glucose, Bld: 106 mg/dL — ABNORMAL HIGH (ref 70–99)
Phosphorus: 4.6 mg/dL (ref 2.5–4.6)
Potassium: 3.5 mmol/L (ref 3.5–5.1)
Sodium: 133 mmol/L — ABNORMAL LOW (ref 135–145)

## 2023-12-30 LAB — VANCOMYCIN, RANDOM: Vancomycin Rm: 5 ug/mL

## 2023-12-30 LAB — HIV ANTIBODY (ROUTINE TESTING W REFLEX): HIV Screen 4th Generation wRfx: NONREACTIVE

## 2023-12-30 LAB — CBC
HCT: 40.6 % (ref 39.0–52.0)
Hemoglobin: 13.7 g/dL (ref 13.0–17.0)
MCH: 32.1 pg (ref 26.0–34.0)
MCHC: 33.7 g/dL (ref 30.0–36.0)
MCV: 95.1 fL (ref 80.0–100.0)
Platelets: 372 10*3/uL (ref 150–400)
RBC: 4.27 MIL/uL (ref 4.22–5.81)
RDW: 12 % (ref 11.5–15.5)
WBC: 25.8 10*3/uL — ABNORMAL HIGH (ref 4.0–10.5)
nRBC: 0 % (ref 0.0–0.2)

## 2023-12-30 LAB — MAGNESIUM: Magnesium: 2 mg/dL (ref 1.7–2.4)

## 2023-12-30 MED ORDER — VANCOMYCIN HCL 1500 MG/300ML IV SOLN
1500.0000 mg | Freq: Two times a day (BID) | INTRAVENOUS | Status: DC
  Administered 2023-12-30 – 2023-12-31 (×3): 1500 mg via INTRAVENOUS
  Filled 2023-12-30 (×5): qty 300

## 2023-12-30 MED ORDER — SENNOSIDES-DOCUSATE SODIUM 8.6-50 MG PO TABS
1.0000 | ORAL_TABLET | Freq: Two times a day (BID) | ORAL | Status: DC | PRN
  Filled 2023-12-30: qty 1

## 2023-12-30 NOTE — Progress Notes (Signed)
   12/30/23 1417  SDOH Interventions  Transportation Interventions Inpatient TOC   Resources placed on AVS

## 2023-12-30 NOTE — Plan of Care (Signed)
  Problem: Coping: Goal: Level of anxiety will decrease Outcome: Progressing   Problem: Pain Managment: Goal: General experience of comfort will improve and/or be controlled Outcome: Progressing   Problem: Skin Integrity: Goal: Risk for impaired skin integrity will decrease Outcome: Progressing   Problem: Skin Integrity: Goal: Skin integrity will improve Outcome: Progressing   Problem: Pain Managment: Goal: General experience of comfort will improve and/or be controlled Outcome: Progressing

## 2023-12-30 NOTE — Plan of Care (Signed)
  Problem: Clinical Measurements: Goal: Will remain free from infection Outcome: Progressing   Problem: Activity: Goal: Risk for activity intolerance will decrease Outcome: Progressing   Problem: Elimination: Goal: Will not experience complications related to bowel motility Outcome: Progressing   Problem: Pain Managment: Goal: General experience of comfort will improve and/or be controlled Outcome: Progressing

## 2023-12-30 NOTE — Progress Notes (Signed)
 PROGRESS NOTE  Jose Mays ZOX:[id number] DOB: [birth year]   PCP: Pcp, No  Patient is from: Home.  DOA: 12/29/2023 LOS: 1  Chief complaints Chief Complaint  Patient presents with   Abscess     Brief Narrative / Interim history: 28 year old M with PMH of recurrent scrotal abscess, hidradenitis and tobacco use disorder presenting with left scrotal swelling, pain and drainage for days, and admitted with left scrotal abscess on scrotal ultrasound and CT abdomen and pelvis.  Culture obtained.  Started on broad-spectrum antibiotics.  Surgery and urology consulted.  Patient underwent I&D by Dr. Dulcy Gibney on 12/29/2023.  Blood cultures NGTD.  Abscess Gram stain with GPC and GNR.  Culture pending.  Remains on broad-spectrum antibiotics.   Subjective: Seen and examined earlier this morning.  No major events overnight or this morning.  Feels better.  Reports improvement in his pain.  Rates his pain 3/10.  Eager to go home but understands the need to stay in the hospital pending cultures and recommendation by other experts.  Objective: Vitals:   12/29/23 1745 12/29/23 1756 12/29/23 2052 12/30/23 0731  BP: 123/88 127/79 121/75 120/73  Pulse: 86 86 74 (!) 53  Resp: 15 16 17 16   Temp:  97.8 F (36.6 C) 97.9 F (36.6 C) 98 F (36.7 C)  TempSrc:  Oral Oral Oral  SpO2: 96% 100% 97% 100%  Weight:      Height:        Examination:  GENERAL: No apparent distress.  Nontoxic. HEENT: MMM.  Vision and hearing grossly intact.  NECK: Supple.  No apparent JVD.  RESP:  No IWOB.  Fair aeration bilaterally. CVS:  RRR. Heart sounds normal.  ABD/GI/GU: BS+. Abd soft, NTND.  Left scrotal I&D wound with packing. MSK/EXT:  Moves extremities. No apparent deformity. No edema.  SKIN: no apparent skin lesion or wound NEURO: AA.  Oriented appropriately.  No apparent focal neuro deficit. PSYCH: Calm. Normal affect.   Patient prefers to have privacy and declined chaperone during exam.  Consultants:   General surgery Urology   Procedures: 6/2-incision and drainage of left hemiscrotal abscess by Dr. Dulcy Gibney  Microbiology summarized: Blood cultures NGTD Urine culture pending Abscess culture pending.  Gram stain with GPC and GNR.  Assessment and plan: Recurrent left scrotal abscess: Presents with scrotal swelling, pain and drainage for few days.  CT showed 8.7 x 4.7 cm scrotal abscess and bilateral inguinal LAD.  Leukocytosis worse today.  Hemodynamically stable. -S/p I&D of left hemiscrotal abscess by Dr. Dulcy Gibney on 6/2 -Abscess culture pending.  Gram stain with GPC and GNR.  HIV nonreactive. -Continue broad-spectrum antibiotics pending cultures -Follow GC/CT and urine culture. -Continue trending leukocytosis -Encouraged smoking cessation -Follow recommendation by urology  Hidradenitis of the groin: Stable -No indication for surgical intervention per surgery - Encourage smoking cessation   Tobacco abuse: Smokes up to a pack a day depending on the day. -Discussed the importance of smoking cessation -Nicotine patch  Leukocytosis: Likely due to #1.  Slightly worse today - Continue monitoring Body mass index is 27.41 kg/m.           DVT prophylaxis:  heparin injection 5,000 Units Start: 12/29/23 2200  Code Status: Full code Family Communication: None at bedside Level of care: Med-Surg Status is: Inpatient Remains inpatient appropriate because: Scrotal wall abscess   Final disposition: Home   55 minutes with more than 50% spent in reviewing records, counseling patient/family and coordinating care.   Sch Meds:  Scheduled Meds:  docusate sodium  100 mg Oral BID   heparin  5,000 Units Subcutaneous Q12H   pantoprazole  (PROTONIX ) IV  40 mg Intravenous Q12H   Continuous Infusions:  ceFEPime (MAXIPIME) IV 2 g (12/30/23 0449)   lactated ringers  75 mL/hr at 12/29/23 1846   vancomycin 1,500 mg (12/30/23 0831)   PRN Meds:.acetaminophen  **OR** acetaminophen ,  morphine  injection, nicotine, ondansetron  **OR** ondansetron  (ZOFRAN ) IV, oxyCODONE , polyethylene glycol, senna-docusate  Antimicrobials: Anti-infectives (From admission, onward)    Start     Dose/Rate Route Frequency Ordered Stop   12/30/23 1500  vancomycin (VANCOREADY) IVPB 1500 mg/300 mL  Status:  Discontinued        1,500 mg 150 mL/hr over 120 Minutes Intravenous Every 12 hours 12/29/23 1654 12/30/23 0653   12/30/23 0730  vancomycin (VANCOREADY) IVPB 1500 mg/300 mL        1,500 mg 150 mL/hr over 120 Minutes Intravenous Every 12 hours 12/30/23 0653     12/30/23 0500  vancomycin (VANCOREADY) IVPB 1500 mg/300 mL  Status:  Discontinued        1,500 mg 150 mL/hr over 120 Minutes Intravenous Every 12 hours 12/29/23 1642 12/29/23 1654   12/29/23 1315  vancomycin (VANCOREADY) IVPB 2000 mg/400 mL        2,000 mg 200 mL/hr over 120 Minutes Intravenous  Once 12/29/23 1307 12/29/23 1555   12/29/23 1300  ceFEPIme (MAXIPIME) 2 g in sodium chloride  0.9 % 100 mL IVPB        2 g 200 mL/hr over 30 Minutes Intravenous Every 8 hours 12/29/23 1307          I have personally reviewed the following labs and images: CBC: Recent Labs  Lab 12/29/23 0758 12/30/23 0825  WBC 13.8* 25.8*  NEUTROABS 10.6*  --   HGB 15.4 13.7  HCT 46.4 40.6  MCV 96.1 95.1  PLT 373 372   BMP &GFR Recent Labs  Lab 12/29/23 0758  NA 134*  K 3.7  CL 98  CO2 26  GLUCOSE 96  BUN 10  CREATININE 0.92  CALCIUM 8.6*   Estimated Creatinine Clearance: 119.6 mL/min (by C-G formula based on SCr of 0.92 mg/dL). Liver & Pancreas: No results for input(s): "AST", "ALT", "ALKPHOS", "BILITOT", "PROT", "ALBUMIN" in the last 168 hours. No results for input(s): "LIPASE", "AMYLASE" in the last 168 hours. No results for input(s): "AMMONIA" in the last 168 hours. Diabetic: Recent Labs    12/29/23 0758  HGBA1C 4.7*   No results for input(s): "GLUCAP" in the last 168 hours. Cardiac Enzymes: Recent Labs  Lab 12/29/23 0758   CKTOTAL 132   No results for input(s): "PROBNP" in the last 8760 hours. Coagulation Profile: No results for input(s): "INR", "PROTIME" in the last 168 hours. Thyroid Function Tests: No results for input(s): "TSH", "T4TOTAL", "FREET4", "T3FREE", "THYROIDAB" in the last 72 hours. Lipid Profile: No results for input(s): "CHOL", "HDL", "LDLCALC", "TRIG", "CHOLHDL", "LDLDIRECT" in the last 72 hours. Anemia Panel: No results for input(s): "VITAMINB12", "FOLATE", "FERRITIN", "TIBC", "IRON", "RETICCTPCT" in the last 72 hours. Urine analysis:    Component Value Date/Time   COLORURINE YELLOW 12/29/2023 1115   APPEARANCEUR CLEAR 12/29/2023 1115   LABSPEC >1.046 (H) 12/29/2023 1115   PHURINE 5.0 12/29/2023 1115   GLUCOSEU NEGATIVE 12/29/2023 1115   HGBUR NEGATIVE 12/29/2023 1115   BILIRUBINUR NEGATIVE 12/29/2023 1115   KETONESUR NEGATIVE 12/29/2023 1115   PROTEINUR 30 (A) 12/29/2023 1115   NITRITE NEGATIVE 12/29/2023 1115   LEUKOCYTESUR TRACE (A) 12/29/2023 1115   Sepsis Labs: Invalid input(s): "  PROCALCITONIN", "LACTICIDVEN"  Microbiology: Recent Results (from the past 240 hours)  Blood culture (routine x 2)     Status: None (Preliminary result)   Collection Time: 12/29/23  7:58 AM   Specimen: BLOOD RIGHT ARM  Result Value Ref Range Status   Specimen Description BLOOD RIGHT ARM  Final   Special Requests   Final    BOTTLES DRAWN AEROBIC AND ANAEROBIC Blood Culture adequate volume   Culture   Final    NO GROWTH < 24 HOURS Performed at [facility], [address], [place], [place] [address]    Report Status PENDING  Incomplete  Aerobic/Anaerobic Culture w Gram Stain (surgical/deep wound)     Status: None (Preliminary result)   Collection Time: 12/29/23  4:27 PM   Specimen: Path fluid; Body Fluid  Result Value Ref Range Status   Specimen Description ABSCESS SCROTUM  Final   Special Requests NONE  Final   Gram Stain   Final    ABUNDANT WBC PRESENT, PREDOMINANTLY  PMN ABUNDANT GRAM POSITIVE COCCI ABUNDANT GRAM NEGATIVE RODS Performed at [facility], [address], [place], [place] [address]    Culture PENDING  Incomplete   Report Status PENDING  Incomplete  Blood culture (routine x 2)     Status: None (Preliminary result)   Collection Time: 12/29/23  9:20 PM   Specimen: BLOOD  Result Value Ref Range Status   Specimen Description BLOOD SITE NOT SPECIFIED  Final   Special Requests   Final    BOTTLES DRAWN AEROBIC AND ANAEROBIC Blood Culture adequate volume   Culture   Final    NO GROWTH < 12 HOURS Performed at [facility], [address], [place], [place] [address]    Report Status PENDING  Incomplete    Radiology Studies: CT ABDOMEN PELVIS W CONTRAST Result Date: 12/29/2023 CLINICAL DATA:  Bilateral inguinal abscesses EXAM: CT ABDOMEN AND PELVIS WITH CONTRAST TECHNIQUE: Multidetector CT imaging of the abdomen and pelvis was performed using the standard protocol following bolus administration of intravenous contrast. RADIATION DOSE REDUCTION: This exam was performed according to the departmental dose-optimization program which includes automated exposure control, adjustment of the mA and/or kV according to patient size and/or use of iterative reconstruction technique. CONTRAST:  75mL OMNIPAQUE  IOHEXOL  350 MG/ML SOLN COMPARISON:  Same-day scrotal ultrasound examination, CT abdomen and pelvis dated 12/11/2023 FINDINGS: Lower chest: No focal consolidation or pulmonary nodule in the lung bases. No pleural effusion or pneumothorax demonstrated. Partially imaged heart size is normal. Hepatobiliary: Subtle enhancing 17 mm focus within segment 8 (3:9), unchanged, likely venous malformation or perfusional variation. Scattered subcentimeter hypodensities throughout the liver, unchanged, too small to characterize but likely cysts. No intra or extrahepatic biliary ductal dilation. Normal gallbladder. Pancreas: No focal lesions or main ductal dilation.  Spleen: Normal in size without focal abnormality. Adrenals/Urinary Tract: No adrenal nodules. No suspicious renal mass, calculi or hydronephrosis. No focal bladder wall thickening. Stomach/Bowel: Normal appearance of the stomach. No evidence of bowel wall thickening, distention, or inflammatory changes. Normal appendix. Vascular/Lymphatic: No significant vascular findings are present. Bilateral inguinal lymphadenopathy measuring to 18 mm on the left (3:74). Reproductive: Prostate is unremarkable. Complex fluid collection within the left hemiscrotum measuring 8.7 x 4.7 cm (3:92) results in rightward deviation of the left testicle. Ill-defined soft tissue thickening overlying the mid right inguinal region (3:76), likely corresponds to abscess noted on earlier same day ultrasound examination. Other: No free fluid, fluid collection, or free air. Musculoskeletal: No  acute or abnormal lytic or blastic osseous lesions. Scattered areas of subcutaneous soft tissue stranding overlying the bilateral inguinal lymphadenopathy. IMPRESSION: 1. Complex fluid collection within the left hemiscrotum measuring 8.7 x 4.7 cm results in rightward deviation of the left testicle, likely a scrotal abscess, as seen on earlier same day ultrasound examination. 2. Ill-defined soft tissue thickening overlying the mid right inguinal region, likely corresponds to abscess noted on earlier same day ultrasound examination. 3. Bilateral inguinal lymphadenopathy, likely reactive. Electronically Signed   By: Limin  Xu M.D.   On: 12/29/2023 10:39      Leandria Thier T. Steele [facility]  If 7PM-7AM, please contact night-coverage [email or web address] 12/30/2023, 9:05 AM

## 2023-12-30 NOTE — TOC CM/SW Note (Signed)
 Referral to financial counselor . Patient  has been assigned to [facility]. They have been trying to reach him since his last visit in May.  They will try to reach him on his hospital room telephone

## 2023-12-30 NOTE — Progress Notes (Signed)
   12/30/23 1445  TOC Brief Assessment  Insurance and Status Reviewed  Patient has primary care physician No  Home environment has been reviewed family  Prior level of function: independent  Prior/Current Home Services No current home services  Social Drivers of Health Review SDOH reviewed no interventions necessary  Readmission risk has been reviewed Yes     Patient stated he has spoken to [facility] . Phone number in computer was wrong. NCM updated phone number. Patient does not have a PCP . Agreeable for TOC CMA to schedule an appointment. Message sent to CMA.  Changed pharmacy to [facility]. At discharge they will call patient with cost of medications. Patient has been doing dressing changes.      Transition of Care Department [facility]) has reviewed patient and  will continue to monitor patient advancement through interdisciplinary progression rounds. If new patient transition needs arise, please place a TOC consult.

## 2023-12-30 NOTE — Anesthesia Postprocedure Evaluation (Signed)
 Anesthesia Post Note  Patient: Jose Mays  Procedure(s) Performed: EXPLORATION, SCROTUM (Scrotum)     Patient location during evaluation: PACU Anesthesia Type: General Level of consciousness: sedated and patient cooperative Pain management: pain level controlled Vital Signs Assessment: post-procedure vital signs reviewed and stable Respiratory status: spontaneous breathing Cardiovascular status: stable Anesthetic complications: no   No notable events documented.  Last Vitals:  Vitals:   12/29/23 2052 12/30/23 0731  BP: 121/75 120/73  Pulse: 74 (!) 53  Resp: 17 16  Temp: 36.6 C 36.7 C  SpO2: 97% 100%    Last Pain:  Vitals:   12/30/23 0731  TempSrc: Oral  PainSc:                  Gorman Laughter

## 2023-12-31 ENCOUNTER — Other Ambulatory Visit (HOSPITAL_COMMUNITY): Payer: Self-pay

## 2023-12-31 LAB — RENAL FUNCTION PANEL
Albumin: 3.2 g/dL — ABNORMAL LOW (ref 3.5–5.0)
Anion gap: 9 (ref 5–15)
BUN: 12 mg/dL (ref 6–20)
CO2: 27 mmol/L (ref 22–32)
Calcium: 8.5 mg/dL — ABNORMAL LOW (ref 8.9–10.3)
Chloride: 99 mmol/L (ref 98–111)
Creatinine, Ser: 0.89 mg/dL (ref 0.61–1.24)
GFR, Estimated: >60 mL/min (ref >60)
Glucose, Bld: 94 mg/dL (ref 70–99)
Phosphorus: 2.9 mg/dL (ref 2.5–4.6)
Potassium: 3 mmol/L — ABNORMAL LOW (ref 3.5–5.1)
Sodium: 135 mmol/L (ref 135–145)

## 2023-12-31 LAB — CBC WITH DIFFERENTIAL/PLATELET
Abs Immature Granulocytes: 0.03 10*3/uL (ref 0.00–0.07)
Basophils Absolute: 0.1 10*3/uL (ref 0.0–0.1)
Basophils Relative: 1 %
Eosinophils Absolute: 0.1 10*3/uL (ref 0.0–0.5)
Eosinophils Relative: 1 %
HCT: 41.4 % (ref 39.0–52.0)
Hemoglobin: 13.4 g/dL (ref 13.0–17.0)
Immature Granulocytes: 0 %
Lymphocytes Relative: 40 %
Lymphs Abs: 3.7 10*3/uL (ref 0.7–4.0)
MCH: 31.8 pg (ref 26.0–34.0)
MCHC: 32.4 g/dL (ref 30.0–36.0)
MCV: 98.1 fL (ref 80.0–100.0)
Monocytes Absolute: 0.9 10*3/uL (ref 0.1–1.0)
Monocytes Relative: 10 %
Neutro Abs: 4.5 10*3/uL (ref 1.7–7.7)
Neutrophils Relative %: 48 %
Platelets: 381 10*3/uL (ref 150–400)
RBC: 4.22 MIL/uL (ref 4.22–5.81)
RDW: 12.2 % (ref 11.5–15.5)
WBC: 9.3 10*3/uL (ref 4.0–10.5)
nRBC: 0 % (ref 0.0–0.2)

## 2023-12-31 LAB — GC/CHLAMYDIA PROBE AMP (~~LOC~~) NOT AT ARMC
Chlamydia: NEGATIVE
Comment: NEGATIVE
Comment: NORMAL
Neisseria Gonorrhea: NEGATIVE

## 2023-12-31 LAB — MAGNESIUM: Magnesium: 1.9 mg/dL (ref 1.7–2.4)

## 2023-12-31 MED ORDER — SULFAMETHOXAZOLE-TRIMETHOPRIM 800-160 MG PO TABS
1.0000 | ORAL_TABLET | Freq: Two times a day (BID) | ORAL | Status: DC
  Administered 2023-12-31: 1 via ORAL
  Filled 2023-12-31: qty 1

## 2023-12-31 MED ORDER — POTASSIUM CHLORIDE CRYS ER 20 MEQ PO TBCR
40.0000 meq | EXTENDED_RELEASE_TABLET | Freq: Once | ORAL | Status: AC
  Administered 2023-12-31: 40 meq via ORAL
  Filled 2023-12-31: qty 2

## 2023-12-31 MED ORDER — SULFAMETHOXAZOLE-TRIMETHOPRIM 800-160 MG PO TABS
1.0000 | ORAL_TABLET | Freq: Two times a day (BID) | ORAL | 0 refills | Status: DC
  Filled 2023-12-31: qty 20, 10d supply, fill #0

## 2023-12-31 MED ORDER — ENOXAPARIN SODIUM 40 MG/0.4ML IJ SOSY: 40.0000 mg | PREFILLED_SYRINGE | Freq: Every evening | INTRAMUSCULAR | Status: DC

## 2023-12-31 NOTE — Progress Notes (Signed)
 Reviewed AVS, patient expressed understanding of medications, MD follow up reviewed.   Removed IV, Site clean, dry and intact.  Patient states all belongings brought to the hospital at time of admission are accounted for and packed to take home.  Picked up medications from [facility] pharmacy. Pt transported to entrance A where family member was waiting in vehicle to transport home.

## 2023-12-31 NOTE — Progress Notes (Signed)
   2 Days Post-Op Subjective: Patient was ambulating in the room on rounds.  He had not removed any of the packing as directed.  No acute events overnight.  Objective: Vital signs in last 24 hours: Temp:  [98 F (36.7 C)-98.5 F (36.9 C)] 98.5 F (36.9 C) (06/04 0854) Pulse Rate:  [60-86] 65 (06/04 0854) Resp:  [16-18] 18 (06/04 0854) BP: (101-132)/(53-75) 132/68 (06/04 0854) SpO2:  [97 %-100 %] 99 % (06/04 0854)  Assessment/Plan:  # Hidradenitis suppurativa # Left scrotal abscess   CT A/P notes large loculated 8.7 x 4.7 cm left scrotal abscess.  Evacuated in the OR by Dr. Dulcy Gibney on 12/29/2023.  Patient has been instructed to remove around 2 and half inches of packing daily.  This is still very tender and he is extremely resistant to remove any of it.  He allowed me to remove the first couple of inches while he did on a washcloth.  He insisted we stop after that.  He can discharge from urologic perspective at any point that he is cleared medically.  I reiterated multiple times that despite the discomfort he cannot leave the packing in perpetuity and he assures me that he will follow the directions.      Intake/Output from previous day: 06/03 0701 - 06/04 0700 In: 2231.6 [P.O.:726; I.V.:500; IV Piggyback:1005.6] Out: 250 [Urine:250]  Intake/Output this shift: Total I/O In: 480 [P.O.:480] Out: -   Physical Exam:  General: Alert and oriented CV: No cyanosis Lungs: equal chest rise Abdomen: Soft, NTND, no rebound or guarding Gu: Serosanguineous drainage on pads.  Packed incision on left hemiscrotum  Lab Results: Recent Labs    12/29/23 0758 12/30/23 0825 12/31/23 0612  HGB 15.4 13.7 13.4  HCT 46.4 40.6 41.4   BMET Recent Labs    12/30/23 0825 12/31/23 0612  NA 133* 135  K 3.5 3.0*  CL 98 99  CO2 26 27  GLUCOSE 106* 94  BUN 8 12  CREATININE 0.84 0.89  CALCIUM 8.7* 8.5*  HGB 13.7 13.4  WBC 25.8* 9.3     Studies/Results: No results found.    LOS: 2  days   Alla Ar, NP [facility] Pager: [phone]  12/31/2023, 1:25 PM

## 2023-12-31 NOTE — Discharge Summary (Signed)
 DISCHARGE SUMMARY  Jose Mays  MR#: [id number]  DOB:1996/05/13  Date of Admission: 12/29/2023 Date of Discharge: 12/31/2023  Attending Physician:Bowyn Mercier Constantine Delude, MD  Patient's PCP:Pcp, No  Disposition: Discharge home  Follow-up Appts:  Follow-up Information     Andrez Banker, MD. Call in 2 week(s).   Specialty: Urology Why: For suture removal Contact information: [address] [place] [address] [phone]         Blane Bunting, PA-C Follow up.   Specialty: Physician Assistant Why: TIME : 10:20 AM   PLEASE ARRIVE AT 10:00 AM DATE : February 26, 2024  PLEASE BRING ALL MEDICATION , ID  and  INS CARD Contact information: [address] [place] [address] [phone]                 Tests Needing Follow-up: - Follow-up on wound culture data to ensure antibiotic prescribed at discharge is adequate - Follow-up wound itself to ensure is healing as expected  Discharge Diagnoses: Left hemiscrotal abscess Hidradenitis of the groin Tobacco abuse  Initial presentation: 28 year old with a history of recurrent scrotal abscesses, hidradenitis suppurativa, and tobacco abuse who presented to the ER 6/2 with left scrotal swelling pain and drainage for multiple days.  Scrotal ultrasound and CT abdomen and pelvis revealed an active left scrotal abscess.   Following admission the patient was taken to the OR by Dr. Dulcy Gibney of Urology on 12/29/2023 for incision and drainage of his left hemiscrotal abscess.  Hospital Course:  Left hemiscrotal abscess Care per urology -status post I&D in OR 6/2 -recommendation is to remove 2-1/2 inches of dressing from his incision daily until it is completely removed starting 12/31/2023 -the patient absolutely must stop smoking -urology has recommended that he follow-up at [facility] dermatology and has provided him with a number to call to arrange this -he is to follow-up in the urology clinic in 2 weeks for suture removal  - culture data revealing abundant gram-positive cocci and abundant gram-negative rods -transition to oral Septra  at time of discharge -very clear and specific wound care instructions provided to patient by urology team before discharge   Hidradenitis of the groin No indication for other surgical interventions per General Surgery -urology has referred the patient to the dermatology clinic at [facility]   Tobacco abuse Patient has been educated on the link between his recurring abscesses and ongoing tobacco abuse and the absolute need to discontinue smoking  Allergies as of 12/31/2023   No Known Allergies      Medication List     TAKE these medications    acetaminophen  500 MG tablet Commonly known as: TYLENOL  Take 1,000 mg by mouth every 6 (six) hours as needed for moderate pain (pain score 4-6), fever or headache.   ibuprofen  200 MG tablet Commonly known as: ADVIL  Take 400 mg by mouth every 6 (six) hours as needed for moderate pain (pain score 4-6) or fever.   sulfamethoxazole -trimethoprim  800-160 MG tablet Commonly known as: BACTRIM  DS Take 1 tablet by mouth every 12 (twelve) hours.        Day of Discharge BP 132/68 (BP Location: Right Arm)   Pulse 65   Temp 98.5 F (36.9 C)   Resp 18   Ht 5\' 7"  (1.702 m)   Wt 79.4 kg   SpO2 99%   BMI 27.41 kg/m   Physical Exam: General: No acute respiratory distress Lungs: Clear to auscultation bilaterally without wheezes or crackles Cardiovascular: Regular rate and rhythm without murmur  gallop or rub normal S1 and S2 Abdomen: Nontender, nondistended, soft, bowel sounds positive, no rebound, no ascites, no appreciable mass -wound inspected by urology prior to discharge Extremities: No significant cyanosis, clubbing, or edema bilateral lower extremities  Basic Metabolic Panel: Recent Labs  Lab 12/29/23 0758 12/30/23 0825 12/31/23 0612  NA 134* 133* 135  K 3.7 3.5 3.0*  CL 98 98 99  CO2 26 26 27   GLUCOSE 96 106* 94  BUN 10 8 12    CREATININE 0.92 0.84 0.89  CALCIUM 8.6* 8.7* 8.5*  MG  --  2.0 1.9  PHOS  --  4.6 2.9    CBC: Recent Labs  Lab 12/29/23 0758 12/30/23 0825 12/31/23 0612  WBC 13.8* 25.8* 9.3  NEUTROABS 10.6*  --  4.5  HGB 15.4 13.7 13.4  HCT 46.4 40.6 41.4  MCV 96.1 95.1 98.1  PLT 373 372 381    Time spent in discharge (includes decision making & examination of pt): 30 minutes  12/31/2023, 3:41 PM   Abbe Abate, MD [facility] Office  [phone]

## 2024-01-01 LAB — AEROBIC/ANAEROBIC CULTURE W GRAM STAIN (SURGICAL/DEEP WOUND): Culture: NO GROWTH

## 2024-01-02 NOTE — Progress Notes (Addendum)
   As the discharging physician I followed up on the wound cultures from this patient's left hemiscrotal abscess drainage, as the final results were pending at the time of his discharge.  I noted that the final reading on the patient's culture was that of moderate prepatellar and abundant Bacteroides which was beta-lactamase positive.  Given that he was discharged on Septra  I wished to provide better anaerobic coverage.  I called the patient at the number listed in his chart and he answered.  He informed me that he was having difficulty managing his dressing as it was proving hard to remove the packing as he was instructed to do by the urology team.  From his description it did not sound as though the wound was worsening but that he simply did not feel comfortable managing his wound as he thought he would.  For this issue I advised him to report to the [facility] emergency room to be seen by the emergency room doctor, who could then consult urology if it was felt to be necessary.  I then discussed with the patient the findings of his wound culture and explained my desire to change his antibiotic coverage.  He voiced understanding.  We agreed that I will call in prescriptions to the [facility] on [address] in [place], [place].  I called that pharmacy and left a message for Augmentin 875/125 p.o. twice daily x 7 days as well as Flagyl  500 mg p.o. twice daily x 7 days.  I explained to the patient that it was very important that he has picked these antibiotics up tonight and begin them immediately.  He voiced understanding.  01/02/2024 - 6:33PM  Abbe Abate, MD [facility]  [phone]

## 2024-01-02 NOTE — Progress Notes (Deleted)
   As the discharging physician I followed up on the wound cultures from this patient's left hemiscrotal abscess drainage, as the final results were pending at the time of his discharge.  I noted that the final reading on the patient's culture was that of moderate prepatellar and abundant Bacteroides which was beta-lactamase positive.  Given that he was discharged on Septra  I wished to provide better anaerobic coverage.  I called the patient at the number listed in his chart and he answered.  He informed me that he was having difficulty managing his dressing as it was proving hard to remove the packing as he was instructed to do by the urology team.  From his description it did not sound as though the wound was worsening but that he simply did not feel comfortable managing his wound as he thought he would.  For this issue I advised him to report to the [facility] emergency room to be seen by the emergency room doctor, who could then consult urology if it was felt to be necessary.  I then discussed with the patient the findings of his wound culture and explained my desire to change his antibiotic coverage.  He voiced understanding.  We agreed that I will call in prescriptions to the [facility] on [address] in [place], [place].  I called that pharmacy and left a message for Augmentin 875/125 p.o. twice daily x 7 days as well as Flagyl  500 mg p.o. twice daily x 7 days.  I explained to the patient that it was very important that he has picked these antibiotics up tonight and begin them immediately.  He voiced understanding.  Abbe Abate, MD [facility]  [phone]

## 2024-01-03 LAB — CULTURE, BLOOD (ROUTINE X 2)
Culture: NO GROWTH
Culture: NO GROWTH
Special Requests: ADEQUATE
Special Requests: ADEQUATE

## 2024-01-13 ENCOUNTER — Other Ambulatory Visit: Payer: Self-pay

## 2024-01-13 ENCOUNTER — Emergency Department (HOSPITAL_COMMUNITY)
Admission: EM | Admit: 2024-01-13 | Discharge: 2024-01-13 | Disposition: A | Discharge status: Home / Self Care | Payer: Self-pay | Attending: Emergency Medicine | Admitting: Emergency Medicine

## 2024-01-13 DIAGNOSIS — Z4802 Encounter for removal of sutures: Secondary | ICD-10-CM

## 2024-01-13 DIAGNOSIS — Z5189 Encounter for other specified aftercare: Secondary | ICD-10-CM

## 2024-01-13 DIAGNOSIS — Z4801 Encounter for change or removal of surgical wound dressing: Secondary | ICD-10-CM | POA: Insufficient documentation

## 2024-01-13 DIAGNOSIS — Z4800 Encounter for change or removal of nonsurgical wound dressing: Secondary | ICD-10-CM

## 2024-01-13 MED ORDER — FENTANYL CITRATE PF 50 MCG/ML IJ SOSY
50.0000 ug | PREFILLED_SYRINGE | Freq: Once | INTRAMUSCULAR | Status: AC
  Administered 2024-01-13: 50 ug via INTRAVENOUS
  Filled 2024-01-13: qty 1

## 2024-01-13 MED ORDER — LORAZEPAM 2 MG/ML IJ SOLN
1.0000 mg | Freq: Once | INTRAMUSCULAR | Status: AC
  Administered 2024-01-13: 1 mg via INTRAVENOUS
  Filled 2024-01-13: qty 1

## 2024-01-13 MED ORDER — LORAZEPAM 1 MG PO TABS
1.0000 mg | ORAL_TABLET | Freq: Once | ORAL | Status: DC
  Filled 2024-01-13: qty 1

## 2024-01-13 MED ORDER — MORPHINE SULFATE (PF) 4 MG/ML IV SOLN
4.0000 mg | Freq: Once | INTRAVENOUS | Status: AC
  Administered 2024-01-13: 4 mg via INTRAVENOUS
  Filled 2024-01-13: qty 1

## 2024-01-13 MED ORDER — BACITRACIN ZINC 500 UNIT/GM EX OINT
TOPICAL_OINTMENT | Freq: Two times a day (BID) | CUTANEOUS | Status: DC
  Filled 2024-01-13: qty 0.9

## 2024-01-13 NOTE — Discharge Instructions (Addendum)
 Call Dr. Dulcy Gibney with [facility] to see if you can be seen sooner for evaluation and further management of your wound, within the next 1 to 2 days would be preferable.  Change the dressing over your wound daily, keep the area clean and dry until you are able to follow-up with Dr. Dulcy Gibney.  Return to the emergency department if your symptoms worsen.

## 2024-01-13 NOTE — ED Provider Notes (Signed)
 [place] EMERGENCY DEPARTMENT AT [facility] Provider Note   CSN: [id number] Arrival date & time: 01/13/24  1020     Patient presents with: Wound Check (Pt comes in today needing packing removal for a ruptured boil on L testicle. Pt had surgery and had area packed last week, was supposed to come on Monday/Tuesday to have packing removed, has stitching in place. Denies infectious symptoms of area, but endorses there is a smell to area )   Jose Mays is a 28 y.o. male.   28 year old male presenting with wound check.  Patient has history of hidradenitis suppurativa and had incision and drainage performed in the OR on his scrotum June 2, packing was left in place after surgery and patient was told to pull on the packing over the next few days, patient did not do so and packing remains in place.  He is here for removal of packing, he has scheduled follow-up with [facility] at the end of next week.  Sutures remain in place as well despite the fact that these were to be removed at the 2-week mark.  Denies fevers, worsening pain/drainage at the incision site.   Wound Check       Prior to Admission medications   Medication Sig Start Date End Date Taking? Authorizing Provider  acetaminophen  (TYLENOL ) 500 MG tablet Take 1,000 mg by mouth every 6 (six) hours as needed for moderate pain (pain score 4-6), fever or headache.    [provider]  ibuprofen  (ADVIL ) 200 MG tablet Take 400 mg by mouth every 6 (six) hours as needed for moderate pain (pain score 4-6) or fever.    [provider]  sulfamethoxazole -trimethoprim  (BACTRIM  DS) 800-160 MG tablet Take 1 tablet by mouth every 12 (twelve) hours. 12/31/23   Abbe Abate, MD    Allergies: Patient has no known allergies.    Review of Systems  Updated Vital Signs BP 116/69 (BP Location: Left Arm)   Pulse 86   Temp 98.4 F (36.9 C) (Oral)   Resp 16   SpO2 100%   Physical Exam Vitals and  nursing note reviewed.  HENT:     Head: Normocephalic.   Eyes:     Extraocular Movements: Extraocular movements intact.    Cardiovascular:     Rate and Rhythm: Normal rate.  Pulmonary:     Effort: Pulmonary effort is normal.   Musculoskeletal:     Comments: Moves all extremities spontaneously without difficulty   Skin:    Comments: Paramedic Lauren at the bedside to chaperone GU exam  There are two openings in the skin of the scrotum. Packing material visualized coming out of superior portion of L scrotum with additional packing visualized from 2-3cm opening in scrotal skin inferiorly, several sutures remain in place along the wound edges, no surrounding erythema or purulent drainage visualized coming from site of wound, foul odor emanating from wound opening   Neurological:     Mental Status: He is alert.     (all labs ordered are listed, but only abnormal results are displayed) Labs Reviewed - No data to display  EKG: None  Radiology: No results found.   Suture Removal  Date/Time: 01/13/2024 2:40 PM  Performed by: Kendrick Pax, PA-C Authorized by: Kendrick Pax, PA-C   Consent:    Consent obtained:  Verbal   Consent given by:  Patient   Risks, benefits, and alternatives were discussed: yes     Risks discussed:  Bleeding, pain  and wound separation   Alternatives discussed:  No treatment Universal protocol:    Procedure explained and questions answered to patient or proxy's satisfaction: yes     Patient identity confirmed:  Verbally with patient Location:    Location:  Anogenital   Anogenital location:  Scrotal wall Procedure details:    Objective wound description: Wound is open with packing material protruding from opening.   Number of sutures removed:  3 Post-procedure details:    Procedure completion:  Tolerated well, no immediate complications .Foreign Body Removal  Date/Time: 01/13/2024 2:50 PM  Performed by: Kendrick Pax, PA-C Authorized by:  Kendrick Pax, PA-C  Consent: Verbal consent obtained Consent given by: patient Patient understanding: patient states understanding of the procedure being performed Patient identity confirmed: verbally with patient Intake: packing material removed from scrotum. Anesthesia: see MAR for details  Sedation: Patient sedated: no  Patient restrained: no Complexity: complex Number of foreign bodies recovered: packing material removed from scrotum. Post-procedure assessment: foreign body removed Comments: Packing material was placed in the wound 12/29/23 following incision and drainage in the operating room, patient did not remove packing material as directed, material was adhered to skin of inner scrotum and was difficult to remove but was removed in full after using extensive irrigation with normal saline, about 12 inches of packing material was removed from the patient's scrotum     Medications Ordered in the ED  morphine  (PF) 4 MG/ML injection 4 mg (4 mg Intravenous Given 01/13/24 1242)  LORazepam  (ATIVAN ) injection 1 mg (1 mg Intravenous Given 01/13/24 1326)  fentaNYL  (SUBLIMAZE ) injection 50 mcg (50 mcg Intravenous Given 01/13/24 1437)                                    Medical Decision Making This patient presents to the ED for concern of retained packing material, this involves an extensive number of treatment options, and is a complaint that carries with it a high risk of complications and morbidity.  The differential diagnosis includes wound infection, bleeding from open wound, abscess, adherence of packing material, retained packing material.    Co morbidities that complicate the patient evaluation  Hidradenitis suppurativa   Additional history obtained:  Additional history obtained from record review External records from outside source obtained and reviewed including recent hospital discharge summary and after visit summary following incision and drainage 12/29/23   Cardiac  Monitoring: / EKG:  The patient was maintained on a cardiac monitor.  I personally viewed and interpreted the cardiac monitored which showed an underlying rhythm of: NSR   Problem List / ED Course / Critical interventions / Medication management   I ordered medication including morphine  and fentanyl   for pain, ativan  for anxiety  Reevaluation of the patient after these medicines showed that the patient improved I have reviewed the patients home medicines and have made adjustments as needed   Social Determinants of Health:  Tobacco use   Test / Admission - Considered:  Chaperone was present at all times. Packing material protruding from left side of scrotum, see physical exam for further details.  3 stitches were removed from the scrotum without complication.  Packing material has been left in place for an extended period of time despite the recommendations of patient's urologist at the time of surgery, packing material was to be removed several days after surgery but remains in place despite today being day 15 postop.  Packing material was somewhat adhered to the inner skin of the scrotum, but after thorough irrigation the packing material was removed in full, with about 12 inches of packing material produced.  There was minimal bleeding after the removal of the packing material, but this seems self-limited.  The wound does not appear infected at this time, there is no purulent drainage or spreading erythema, patient completed course of Bactrim  as previously directed, do not feel that additional course of antibiotics is necessary at this time.  I do not feel that it would be optimal to instill additional packing material at this time, the wound was left open for continued drainage.  Nursing staff applied bacitracin  ointment to the affected area with Xeroform overlying the wound.  [facility] has appointment with [facility] late next week, however I advised patient to call their office to see if  this appointment can be made sooner given that the packing material was left in for an extended period of time and wound has to be left open, he understands that this could result in recurrence of his prior infection.  I recommend that patient follow-up with [facility] in the next 1 to 2 days.  He does not have a primary care provider but is scheduled to see someone to establish care at the end of July.  Case discussed in depth with Dr. Efraim Grange.  At this time, patient is appropriate for discharge.  Return precautions discussed.  He is in agreement with this plan.    Risk OTC drugs. Prescription drug management.        Final diagnoses:  Visit for wound check  Encounter for removal of sutures  Encounter for removal of abscess packing    ED Discharge Orders     None          Adolm Ahumada 01/13/24 1519    Ninetta Basket, MD 01/13/24 1726

## 2024-02-26 ENCOUNTER — Ambulatory Visit: Payer: Self-pay | Admitting: Physician Assistant

## 2024-04-03 ENCOUNTER — Other Ambulatory Visit: Payer: Self-pay

## 2024-04-03 ENCOUNTER — Emergency Department: Payer: Self-pay

## 2024-04-03 ENCOUNTER — Observation Stay
Admission: EM | Admit: 2024-04-03 | Discharge: 2024-04-05 | Disposition: A | Discharge status: Home / Self Care | Payer: Self-pay | Attending: Student | Admitting: Student

## 2024-04-03 DIAGNOSIS — K3184 Gastroparesis: Secondary | ICD-10-CM | POA: Insufficient documentation

## 2024-04-03 DIAGNOSIS — R61 Generalized hyperhidrosis: Secondary | ICD-10-CM | POA: Insufficient documentation

## 2024-04-03 DIAGNOSIS — R112 Nausea with vomiting, unspecified: Principal | ICD-10-CM | POA: Diagnosis present

## 2024-04-03 DIAGNOSIS — Z1152 Encounter for screening for COVID-19: Secondary | ICD-10-CM | POA: Insufficient documentation

## 2024-04-03 DIAGNOSIS — I10 Essential (primary) hypertension: Secondary | ICD-10-CM | POA: Insufficient documentation

## 2024-04-03 DIAGNOSIS — Z720 Tobacco use: Secondary | ICD-10-CM | POA: Diagnosis present

## 2024-04-03 DIAGNOSIS — F17210 Nicotine dependence, cigarettes, uncomplicated: Secondary | ICD-10-CM | POA: Insufficient documentation

## 2024-04-03 DIAGNOSIS — R1110 Vomiting, unspecified: Principal | ICD-10-CM

## 2024-04-03 LAB — COMPREHENSIVE METABOLIC PANEL WITH GFR
ALT: 13 U/L (ref 0–44)
AST: 22 U/L (ref 15–41)
Albumin: 4.5 g/dL (ref 3.5–5.0)
Alkaline Phosphatase: 75 U/L (ref 38–126)
Anion gap: 13 (ref 5–15)
BUN: 11 mg/dL (ref 6–20)
CO2: 24 mmol/L (ref 22–32)
Calcium: 9.3 mg/dL (ref 8.9–10.3)
Chloride: 102 mmol/L (ref 98–111)
Creatinine, Ser: 0.9 mg/dL (ref 0.61–1.24)
GFR, Estimated: >60 mL/min (ref >60)
Glucose, Bld: 110 mg/dL — ABNORMAL HIGH (ref 70–99)
Potassium: 3.3 mmol/L — ABNORMAL LOW (ref 3.5–5.1)
Sodium: 139 mmol/L (ref 135–145)
Total Bilirubin: 1.2 mg/dL (ref 0.0–1.2)
Total Protein: 8.8 g/dL — ABNORMAL HIGH (ref 6.5–8.1)

## 2024-04-03 LAB — RESP PANEL BY RT-PCR (RSV, FLU A&B, COVID)  RVPGX2
Influenza A by PCR: NEGATIVE
Influenza B by PCR: NEGATIVE
Resp Syncytial Virus by PCR: NEGATIVE
SARS Coronavirus 2 by RT PCR: NEGATIVE

## 2024-04-03 LAB — CBC
HCT: 46.2 % (ref 39.0–52.0)
Hemoglobin: 15.1 g/dL (ref 13.0–17.0)
MCH: 31.1 pg (ref 26.0–34.0)
MCHC: 32.7 g/dL (ref 30.0–36.0)
MCV: 95.3 fL (ref 80.0–100.0)
Platelets: 431 K/uL — ABNORMAL HIGH (ref 150–400)
RBC: 4.85 MIL/uL (ref 4.22–5.81)
RDW: 12.1 % (ref 11.5–15.5)
WBC: 7.2 K/uL (ref 4.0–10.5)
nRBC: 0 % (ref 0.0–0.2)

## 2024-04-03 LAB — URINE DRUG SCREEN, QUALITATIVE (ARMC ONLY)
Amphetamines, Ur Screen: NOT DETECTED
Barbiturates, Ur Screen: NOT DETECTED
Benzodiazepine, Ur Scrn: NOT DETECTED
Cannabinoid 50 Ng, Ur ~~LOC~~: POSITIVE — AB
Cocaine Metabolite,Ur ~~LOC~~: NOT DETECTED
MDMA (Ecstasy)Ur Screen: NOT DETECTED
Methadone Scn, Ur: NOT DETECTED
Opiate, Ur Screen: NOT DETECTED
Phencyclidine (PCP) Ur S: NOT DETECTED
Tricyclic, Ur Screen: NOT DETECTED

## 2024-04-03 LAB — URINALYSIS, ROUTINE W REFLEX MICROSCOPIC
Bacteria, UA: NONE SEEN
Bilirubin Urine: NEGATIVE
Glucose, UA: NEGATIVE mg/dL
Hgb urine dipstick: NEGATIVE
Ketones, ur: 5 mg/dL — AB
Nitrite: NEGATIVE
Protein, ur: NEGATIVE mg/dL
Specific Gravity, Urine: 1.023 (ref 1.005–1.030)
pH: 7 (ref 5.0–8.0)

## 2024-04-03 LAB — MAGNESIUM: Magnesium: 2.2 mg/dL (ref 1.7–2.4)

## 2024-04-03 LAB — LIPASE, BLOOD: Lipase: 27 U/L (ref 11–51)

## 2024-04-03 LAB — TROPONIN I (HIGH SENSITIVITY): Troponin I (High Sensitivity): 5 ng/L (ref <18)

## 2024-04-03 MED ORDER — ONDANSETRON HCL 4 MG/2ML IJ SOLN
4.0000 mg | Freq: Four times a day (QID) | INTRAMUSCULAR | Status: DC | PRN
  Administered 2024-04-04: 4 mg via INTRAVENOUS
  Filled 2024-04-03: qty 2

## 2024-04-03 MED ORDER — ONDANSETRON HCL 4 MG PO TABS: 4.0000 mg | ORAL_TABLET | Freq: Four times a day (QID) | ORAL | Status: DC | PRN

## 2024-04-03 MED ORDER — SODIUM CHLORIDE 0.9 % IV BOLUS
1000.0000 mL | Freq: Once | INTRAVENOUS | Status: AC
  Administered 2024-04-03: 1000 mL via INTRAVENOUS

## 2024-04-03 MED ORDER — DROPERIDOL 2.5 MG/ML IJ SOLN
2.5000 mg | Freq: Once | INTRAMUSCULAR | Status: AC
  Administered 2024-04-03: 2.5 mg via INTRAVENOUS
  Filled 2024-04-03: qty 2

## 2024-04-03 MED ORDER — ONDANSETRON HCL 4 MG/2ML IJ SOLN
4.0000 mg | Freq: Once | INTRAMUSCULAR | Status: AC
  Administered 2024-04-03: 4 mg via INTRAVENOUS
  Filled 2024-04-03: qty 2

## 2024-04-03 MED ORDER — HALOPERIDOL LACTATE 5 MG/ML IJ SOLN: 5.0000 mg | INTRAMUSCULAR | Status: DC | PRN

## 2024-04-03 MED ORDER — SENNOSIDES-DOCUSATE SODIUM 8.6-50 MG PO TABS: 1.0000 | ORAL_TABLET | Freq: Every evening | ORAL | Status: DC | PRN

## 2024-04-03 MED ORDER — NICOTINE 21 MG/24HR TD PT24: 21.0000 mg | MEDICATED_PATCH | Freq: Every day | TRANSDERMAL | Status: DC | PRN

## 2024-04-03 MED ORDER — LACTATED RINGERS IV SOLN: INTRAVENOUS | Status: DC

## 2024-04-03 MED ORDER — ACETAMINOPHEN 325 MG PO TABS: 650.0000 mg | ORAL_TABLET | Freq: Four times a day (QID) | ORAL | Status: DC | PRN

## 2024-04-03 MED ORDER — ACETAMINOPHEN 650 MG RE SUPP: 650.0000 mg | Freq: Four times a day (QID) | RECTAL | Status: DC | PRN

## 2024-04-03 MED ORDER — HYDRALAZINE HCL 20 MG/ML IJ SOLN: 5.0000 mg | Freq: Four times a day (QID) | INTRAMUSCULAR | Status: DC | PRN

## 2024-04-03 MED ORDER — SODIUM CHLORIDE 0.9 % IV SOLN
12.5000 mg | Freq: Once | INTRAVENOUS | Status: AC
  Administered 2024-04-03: 12.5 mg via INTRAVENOUS
  Filled 2024-04-03: qty 12.5

## 2024-04-03 MED ORDER — HEPARIN SODIUM (PORCINE) 5000 UNIT/ML IJ SOLN
5000.0000 [IU] | Freq: Three times a day (TID) | INTRAMUSCULAR | Status: DC
  Administered 2024-04-03 – 2024-04-04 (×3): 5000 [IU] via SUBCUTANEOUS
  Filled 2024-04-03 (×3): qty 1

## 2024-04-03 NOTE — Hospital Course (Signed)
 Mr. Jose Mays is a 28 year old male with history of gastroparesis, cannabinoid hyperemesis syndrome, who presents to the emergency department for chief concerns of nausea and vomiting.  Vitals in the ED showed t of 97.7, rr 20, heart rate 73, blood pressure 128/75, SpO2 95% on room air.  Serum sodium is 139, potassium 3.3, chloride 102, bicarb 24, BUN 11, serum creatinine 0.90, eGFR greater than 60, nonfasting glucose 110, WBC 7.2, hemoglobin 15.1, platelets of 431.  ED treatment: Droperidol  2.5 mg IV one-time dose, ondansetron  4 mg IV one-time dose, Phenergan  12.5 mg IV one-time dose, sodium chloride  2 L bolus.

## 2024-04-03 NOTE — H&P (Addendum)
 History and Physical   Jose Mays FMW:[id number] DOB: 1995-08-11 DOA: 04/03/2024  PCP: Pcp, No  Patient coming from: home   I have personally briefly reviewed patient's old medical records in [facility] EMR.  Chief Concern: Intractable nausea and vomiting  HPI: Mr. Jose Mays is a 28 year old male with history of gastroparesis, cannabinoid hyperemesis syndrome, who presents to the emergency department for chief concerns of nausea and vomiting.  Vitals in the ED showed t of 97.7, rr 20, heart rate 73, blood pressure 128/75, SpO2 95% on room air.  Serum sodium is 139, potassium 3.3, chloride 102, bicarb 24, BUN 11, serum creatinine 0.90, eGFR greater than 60, nonfasting glucose 110, WBC 7.2, hemoglobin 15.1, platelets of 431.  ED treatment: Droperidol  2.5 mg IV one-time dose, ondansetron  4 mg IV one-time dose, Phenergan  12.5 mg IV one-time dose, sodium chloride  2 L bolus. ------------------------------------- At bedside, patient able to tell me his first and last name, age, location, current calendar year.  He report he started vomiting about 10 this morning.  He reports he smokes pot every day.  He reports no changes to his recent supply.  He reports that he had to had a bowel movement but he was holding it in and he thought that this was what caused him to have nausea and vomiting.  He reports that he could not go BM because he was in public.  The same thing happened to him on Monday.  He denies trauma to his person.  He denies chest pain, dysuria, hematuria, diarrhea, swelling of his lower extremities, blood in his stool, blood in his vomitus.  He reports he is just vomiting yellow bile.  Social history: He lives at home alone.  He smokes about half a pack of cigarettes per day.  He denies IV recreational drug use and EtOH use.  He reports he smokes marijuana every day.  ROS: Constitutional: no weight change, no fever ENT/Mouth: no sore throat, no rhinorrhea Eyes: no  eye pain, no vision changes Cardiovascular: no chest pain, no dyspnea,  no edema, no palpitations Respiratory: no cough, no sputum, no wheezing Gastrointestinal: + nausea, + vomiting, no diarrhea, no constipation Genitourinary: no urinary incontinence, no dysuria, no hematuria Musculoskeletal: no arthralgias, no myalgias Skin: no skin lesions, no pruritus, Neuro: + weakness, no loss of consciousness, no syncope Psych: no anxiety, no depression, + decrease appetite Heme/Lymph: no bruising, no bleeding  ED Course: Discussed with EDP, patient requiring hospitalization for chief concerns of intractable nausea and vomiting.  Patient vomited at least 10 times in the ED.  Assessment/Plan  Principal Problem:   Intractable nausea and vomiting Active Problems:   Cannabinoid hyperemesis syndrome   Tobacco abuse   Gastroparesis   Essential hypertension   Diaphoresis   Assessment and Plan:  * Intractable nausea and vomiting Check COVID/influenza A/influenza B/RSV PCR for gastroenteritis Clear liquid diet with orders to advance as tolerated to full liquid diet LR infusion at 125 mL/h, 1 day ordered Symptomatic support: Haldol  5 mg IV every 4 hours as needed for nausea and vomiting, 15 hours of coverage ordered  Diaphoresis With intractable nausea and vomiting Workup in progress Check hs troponin  Essential hypertension Secondary to intractable nausea and vomiting Hydralazine  5 mg IV every 6 hours as needed for SBP greater 170, 5 days ordered  Tobacco abuse As needed nicotine  patch  Chart reviewed.   DVT prophylaxis: Heparin  5000 units subcutaneous every 8 hours Code Status: Full code Diet: Clear liquid diet Family  Communication: No Disposition Plan: Pending clinical course Consults called: None at this time Admission status: Telemetry medical, observation  Past Medical History:  Diagnosis Date   Abscess    Acute gastroenteritis 05/13/2017   Cannabinoid hyperemesis syndrome     Hypokalemia 05/23/2016   Pneumomediastinum (HCC) 12/11/2023   Past Surgical History:  Procedure Laterality Date   INCISION AND DRAINAGE ABSCESS N/A 08/11/2022   Procedure: INCISION AND DRAINAGE ABSCESS;  Surgeon: Penne Knee, MD;  Location: [facility] ORS;  Service: Urology;  Laterality: N/A;   SCROTAL EXPLORATION N/A 12/29/2023   Procedure: EXPLORATION, SCROTUM;  Surgeon: Cam Morene ORN, MD;  Location: [facility] OR;  Service: Urology;  Laterality: N/A;  IRRIGATION AND DEBRIDMENT   TONSILLECTOMY     Social History:  reports that he has been smoking cigarettes. He has never used smokeless tobacco. He reports current drug use. Drug: Marijuana. He reports that he does not drink alcohol.  No Known Allergies Family History  Problem Relation Age of Onset   Prostate cancer Paternal Uncle    Rheum arthritis Paternal Grandmother    Bladder Cancer Neg Hx    Family history: Family history reviewed and not pertinent.  Prior to Admission medications   Medication Sig Start Date End Date Taking? Authorizing Provider  acetaminophen  (TYLENOL ) 500 MG tablet Take 1,000 mg by mouth every 6 (six) hours as needed for moderate pain (pain score 4-6), fever or headache.    [provider]  ibuprofen  (ADVIL ) 200 MG tablet Take 400 mg by mouth every 6 (six) hours as needed for moderate pain (pain score 4-6) or fever.    [provider]  sulfamethoxazole -trimethoprim  (BACTRIM  DS) 800-160 MG tablet Take 1 tablet by mouth every 12 (twelve) hours. 12/31/23   Danton Reyes DASEN, MD   Physical Exam: Vitals:   04/03/24 1530 04/03/24 1545 04/03/24 1700 04/03/24 1801  BP:   (!) 141/80 119/72  Pulse: (!) 47 (!) 48 (!) 50 (!) 51  Resp: (!) 22 20 (!) 23 20  Temp:    97.6 F (36.4 C)  TempSrc:    Oral  SpO2: 100% 99% 99% 100%  Weight:      Height:       Constitutional: appears  older than chronological age, calm, mildly uncomfortable Eyes: PERRL, lids and conjunctivae normal ENMT: Mucous membranes  are moist. Posterior pharynx clear of any exudate or lesions. Age-appropriate dentition. Hearing appropriate Neck: normal, supple, no masses, no thyromegaly Respiratory: clear to auscultation bilaterally, no wheezing, no crackles. Normal respiratory effort. No accessory muscle use.  Cardiovascular: Regular rate and rhythm, no murmurs / rubs / gallops. No extremity edema. 2+ pedal pulses. No carotid bruits.  Abdomen: no tenderness, no masses palpated, no hepatosplenomegaly. Bowel sounds positive.  Musculoskeletal: no clubbing / cyanosis. No joint deformity upper and lower extremities. Good ROM, no contractures, no atrophy. Normal muscle tone.  Skin: no rashes, lesions, ulcers. No induration Neurologic: Sensation intact. Strength 5/5 in all 4.  Psychiatric: Normal judgment and insight. Alert and oriented x 3. Normal mood.  Flat affect  EKG: independently reviewed, showing sinus rhythm with rate of 68, QTc 442  Chest x-ray on Admission: I personally reviewed and I agree with radiologist reading as below.  DG Chest Portable 1 View Result Date: 04/03/2024 CLINICAL DATA:  Vomiting. EXAM: PORTABLE CHEST 1 VIEW COMPARISON:  Dec 13, 2023. FINDINGS: The heart size and mediastinal contours are within normal limits. Both lungs are clear. The visualized skeletal structures are unremarkable. IMPRESSION: No active disease. Electronically  Signed   By: Lynwood Landy Raddle M.D.   On: 04/03/2024 12:57   Labs on Admission: I have personally reviewed following labs  CBC: Recent Labs  Lab 04/03/24 1219  WBC 7.2  HGB 15.1  HCT 46.2  MCV 95.3  PLT 431*   Basic Metabolic Panel: Recent Labs  Lab 04/03/24 1219  NA 139  K 3.3*  CL 102  CO2 24  GLUCOSE 110*  BUN 11  CREATININE 0.90  CALCIUM 9.3  MG 2.2   GFR: Estimated Creatinine Clearance: 117.7 mL/min (by C-G formula based on SCr of 0.9 mg/dL).  Liver Function Tests: Recent Labs  Lab 04/03/24 1219  AST 22  ALT 13  ALKPHOS 75  BILITOT 1.2  PROT  8.8*  ALBUMIN 4.5   Recent Labs  Lab 04/03/24 1219  LIPASE 27   Urine analysis:    Component Value Date/Time   COLORURINE YELLOW (A) 04/03/2024 1220   APPEARANCEUR CLEAR (A) 04/03/2024 1220   LABSPEC 1.023 04/03/2024 1220   PHURINE 7.0 04/03/2024 1220   GLUCOSEU NEGATIVE 04/03/2024 1220   HGBUR NEGATIVE 04/03/2024 1220   BILIRUBINUR NEGATIVE 04/03/2024 1220   KETONESUR 5 (A) 04/03/2024 1220   PROTEINUR NEGATIVE 04/03/2024 1220   NITRITE NEGATIVE 04/03/2024 1220   LEUKOCYTESUR TRACE (A) 04/03/2024 1220   This document was prepared using Dragon Voice Recognition software and may include unintentional dictation errors.  Dr. Sherre [facility]  If 7PM-7AM, please contact overnight-coverage provider If 7AM-7PM, please contact day attending provider [email or web address]  04/03/2024, 6:50 PM

## 2024-04-03 NOTE — ED Notes (Signed)
 Patient states he last smoked marijuana 18 hours ago. Patient was having intractable vomiting, pacing, not compliant with request to stay on the stretcher. Patient became hot per report of Dispensing optician and took off his clothes. After first 20g IV was inserted by [facility] Paramedic, patient removed it by stretching after medication was given by [facility] Paramedic.  Another IV was placed by [employer]. Patient couldn't lie down for the EKG, but did eventually managed to sit on the stretcher and hold still. Patient is now resting on the stretcher. Pulse is 53bpm. Dr. Levander called to the bedside due to patient being pale and diaphoretic. Patient is resting with side rails up and on the cardiac monitor. Call bell is available. Patient is alert, but resting with eyes closed. Answers questions appropriately. Door is open. Patient was placed in a gown.

## 2024-04-03 NOTE — Assessment & Plan Note (Signed)
-   As needed nicotine patch ordered for nicotine craving

## 2024-04-03 NOTE — Assessment & Plan Note (Addendum)
 With intractable nausea and vomiting Workup in progress Check hs troponin

## 2024-04-03 NOTE — Assessment & Plan Note (Addendum)
 Check COVID/influenza A/influenza B/RSV PCR for gastroenteritis Clear liquid diet with orders to advance as tolerated to full liquid diet LR infusion at 125 mL/h, 1 day ordered Symptomatic support: Haldol  5 mg IV every 4 hours as needed for nausea and vomiting, 15 hours of coverage ordered

## 2024-04-03 NOTE — ED Provider Notes (Signed)
 [facility] Provider Note    Event Date/Time   First MD Initiated Contact with Patient 04/03/24 1238     (approximate)   History   Emesis   HPI  Jose Mays is a 28 year old male with history of cannabinoid hyperemesis presenting to the ER for evaluation of vomiting and abdominal pain.  Patient reports that shortly after having a bowel movement a few hours ago he had onset of multiple episodes of vomiting.  Says that this is typical for episodes of his cannabinoid hyperemesis.  He does report ongoing THC use.  On review of his chart, does have a history of scrotal abscesses, admitted in June of this year for similar, but denies concerns for this today.  Also note that in May of this year patient was admitted for pneumomediastinum in the setting of nausea and vomiting, no evidence of esophageal perforation.     Physical Exam   Triage Vital Signs: ED Triage Vitals  Encounter Vitals Group     BP 04/03/24 1209 (!) 100/57     Girls Systolic BP Percentile --      Girls Diastolic BP Percentile --      Boys Systolic BP Percentile --      Boys Diastolic BP Percentile --      Pulse Rate 04/03/24 1209 73     Resp 04/03/24 1209 20     Temp 04/03/24 1209 97.7 F (36.5 C)     Temp Source 04/03/24 1209 Axillary     SpO2 04/03/24 1209 99 %     Weight 04/03/24 1207 175 lb (79.4 kg)     Height 04/03/24 1207 5' 5 (1.651 m)     Head Circumference --      Peak Flow --      Pain Score 04/03/24 1204 0     Pain Loc --      Pain Education --      Exclude from Growth Chart --     Most recent vital signs: Vitals:   04/03/24 1530 04/03/24 1545  BP:    Pulse: (!) 47 (!) 48  Resp: (!) 22 20  Temp:    SpO2: 100% 99%     General: Awake, interactive, restless, frequently dry heaving and moving around in bed CV:  Regular rate, good peripheral perfusion.  Resp:  Unlabored respirations, lungs clear to auscultation Abd:  Nondistended, soft, no appreciable  tenderness to palpation Neuro:  Symmetric facial movement, fluid speech   ED Results / Procedures / Treatments   Labs (all labs ordered are listed, but only abnormal results are displayed) Labs Reviewed  COMPREHENSIVE METABOLIC PANEL WITH GFR - Abnormal; Notable for the following components:      Result Value   Potassium 3.3 (*)    Glucose, Bld 110 (*)    Total Protein 8.8 (*)    All other components within normal limits  CBC - Abnormal; Notable for the following components:   Platelets 431 (*)    All other components within normal limits  LIPASE, BLOOD  URINALYSIS, ROUTINE W REFLEX MICROSCOPIC     EKG EKG independently reviewed and interpreted by myself demonstrates:  EKG demonstrates normal sinus rhythm at a rate of 68, PR 178, QRS 90, QTc 442, no acute ST changes  RADIOLOGY Imaging independently reviewed and interpreted by myself demonstrates:  CXR without focal consolidation  Formal Radiology Read:  DG Chest Portable 1 View Result Date: 04/03/2024 CLINICAL DATA:  Vomiting. EXAM: PORTABLE CHEST 1  VIEW COMPARISON:  Dec 13, 2023. FINDINGS: The heart size and mediastinal contours are within normal limits. Both lungs are clear. The visualized skeletal structures are unremarkable. IMPRESSION: No active disease. Electronically Signed   By: Lynwood Landy Raddle M.D.   On: 04/03/2024 12:57    PROCEDURES:  Critical Care performed: Yes, see critical care procedure note(s)  CRITICAL CARE Performed by: Nilsa Dade   Total critical care time: 31 minutes  Critical care time was exclusive of separately billable procedures and treating other patients.  Critical care was necessary to treat or prevent imminent or life-threatening deterioration.  Critical care was time spent personally by me on the following activities: development of treatment plan with patient and/or surrogate as well as nursing, discussions with consultants, evaluation of patient's response to treatment, examination of  patient, obtaining history from patient or surrogate, ordering and performing treatments and interventions, ordering and review of laboratory studies, ordering and review of radiographic studies, pulse oximetry and re-evaluation of patient's condition.   Procedures   MEDICATIONS ORDERED IN ED: Medications  droperidol  (INAPSINE ) 2.5 MG/ML injection 2.5 mg (2.5 mg Intravenous Given 04/03/24 1257)  sodium chloride  0.9 % bolus 1,000 mL (0 mLs Intravenous Stopped 04/03/24 1558)  promethazine  (PHENERGAN ) 12.5 mg in sodium chloride  0.9 % 50 mL IVPB (0 mg Intravenous Stopped 04/03/24 1453)  sodium chloride  0.9 % bolus 1,000 mL (1,000 mLs Intravenous New Bag/Given 04/03/24 1713)  ondansetron  (ZOFRAN ) injection 4 mg (4 mg Intravenous Given 04/03/24 1713)     IMPRESSION / MDM / ASSESSMENT AND PLAN / ED COURSE  I reviewed the triage vital signs and the nursing notes.  Differential diagnosis includes, but is not limited to, tablet hyperemesis syndrome, viral illness, low suspicion significant acute intra-abdominal process given overall reassuring abdominal exam and history of similar symptoms in the past  Patient's presentation is most consistent with acute presentation with potential threat to life or bodily function.  28 year old male presenting to the emergency department for evaluation of vomiting.  Frequent episodes of vomiting here but reassuring abdominal exam.  Has had multiple CT scans this year and with reassuring abdominal exam I do not feel imaging is indicated at this time.  Will trial IV fluids, droperidol .  Patient initially with improved symptoms after droperidol , but did have recurrent vomiting.  Ordered for Phenergan  once again with temporary improvement followed by recurrent episodes of vomiting.  Will order additional IV fluids and Zofran .  Patient serially reassessed.  He unfortunately continued to have frequent episodes of vomiting in the emergency department despite several rounds of  antiemetics.  He continues to have reassuring abdominal exam and denies any subjective ongoing pain.  However, with his intractable vomiting, do think admission is reasonable.  Will reach out to hospitalist team.  Case discussed with Dr. Sherre.  She will evaluate for anticipated admission.      FINAL CLINICAL IMPRESSION(S) / ED DIAGNOSES   Final diagnoses:  Intractable vomiting     Rx / DC Orders   ED Discharge Orders     None        Note:  This document was prepared using Dragon voice recognition software and may include unintentional dictation errors.   Dade Nilsa, MD 04/03/24 [phone]

## 2024-04-03 NOTE — ED Notes (Signed)
 Patient continues to intermittently vomit and rests in between episodes. Dr. Levander aware.

## 2024-04-03 NOTE — ED Triage Notes (Signed)
 Pt to EED for vomiting since 2 hours ago. Also pt was holding in BM because was out in public and then he had BM 30 min ago. Pt states same thing happened on Monday. Was going to have BM and then was vomiting and sweating. Pt states he has been dry heaving and throwing up stomach acid. Pt is diaphoretic. Speaking in full sentences. Respirations unlabored. States he is diaphoretic because holding himself back from vomiting. Hx gastroenteritis and cannabinoid hyperemesis syndrome.

## 2024-04-03 NOTE — Assessment & Plan Note (Signed)
 Secondary to intractable nausea and vomiting Hydralazine  5 mg IV every 6 hours as needed for SBP greater 170, 5 days ordered

## 2024-04-03 NOTE — ED Notes (Addendum)
 Patient had another episode of vomiting a small amount of bile-colored emesis. Patient is now resting, skin is still diaphoretic.

## 2024-04-03 NOTE — ED Notes (Signed)
 Patient is resting on stretcher. Skin remains diaphoretic.

## 2024-04-04 LAB — CBC
HCT: 44.2 % (ref 39.0–52.0)
Hemoglobin: 14.5 g/dL (ref 13.0–17.0)
MCH: 31.5 pg (ref 26.0–34.0)
MCHC: 32.8 g/dL (ref 30.0–36.0)
MCV: 95.9 fL (ref 80.0–100.0)
Platelets: 398 K/uL (ref 150–400)
RBC: 4.61 MIL/uL (ref 4.22–5.81)
RDW: 12.2 % (ref 11.5–15.5)
WBC: 9.5 K/uL (ref 4.0–10.5)
nRBC: 0 % (ref 0.0–0.2)

## 2024-04-04 LAB — BASIC METABOLIC PANEL WITH GFR
Anion gap: 10 (ref 5–15)
BUN: 10 mg/dL (ref 6–20)
CO2: 28 mmol/L (ref 22–32)
Calcium: 8.8 mg/dL — ABNORMAL LOW (ref 8.9–10.3)
Chloride: 103 mmol/L (ref 98–111)
Creatinine, Ser: 0.86 mg/dL (ref 0.61–1.24)
GFR, Estimated: >60 mL/min (ref >60)
Glucose, Bld: 90 mg/dL (ref 70–99)
Potassium: 3.2 mmol/L — ABNORMAL LOW (ref 3.5–5.1)
Sodium: 141 mmol/L (ref 135–145)

## 2024-04-04 LAB — VITAMIN D 25 HYDROXY (VIT D DEFICIENCY, FRACTURES): Vit D, 25-Hydroxy: 25.55 ng/mL — ABNORMAL LOW (ref 30–100)

## 2024-04-04 LAB — MAGNESIUM: Magnesium: 1.9 mg/dL (ref 1.7–2.4)

## 2024-04-04 LAB — VITAMIN B12: Vitamin B-12: 343 pg/mL (ref 180–914)

## 2024-04-04 LAB — PHOSPHORUS: Phosphorus: 2.3 mg/dL — ABNORMAL LOW (ref 2.5–4.6)

## 2024-04-04 MED ORDER — POTASSIUM CHLORIDE CRYS ER 20 MEQ PO TBCR
40.0000 meq | EXTENDED_RELEASE_TABLET | ORAL | Status: AC
  Administered 2024-04-04 (×2): 40 meq via ORAL
  Filled 2024-04-04 (×2): qty 2

## 2024-04-04 MED ORDER — VITAMIN D (ERGOCALCIFEROL) 1.25 MG (50000 UNIT) PO CAPS
50000.0000 [IU] | ORAL_CAPSULE | ORAL | Status: DC
  Administered 2024-04-05: 50000 [IU] via ORAL
  Filled 2024-04-04: qty 1

## 2024-04-04 MED ORDER — PANTOPRAZOLE SODIUM 40 MG PO TBEC
40.0000 mg | DELAYED_RELEASE_TABLET | Freq: Two times a day (BID) | ORAL | Status: DC
  Administered 2024-04-05: 40 mg via ORAL
  Filled 2024-04-04: qty 1

## 2024-04-04 MED ORDER — SODIUM CHLORIDE 0.9 % IV SOLN
12.5000 mg | Freq: Once | INTRAVENOUS | Status: AC
  Administered 2024-04-04: 12.5 mg via INTRAVENOUS
  Filled 2024-04-04: qty 0.5

## 2024-04-04 MED ORDER — LACTATED RINGERS IV SOLN: INTRAVENOUS | Status: AC

## 2024-04-04 MED ORDER — K PHOS MONO-SOD PHOS DI & MONO 155-852-130 MG PO TABS
500.0000 mg | ORAL_TABLET | Freq: Four times a day (QID) | ORAL | Status: DC
  Filled 2024-04-04 (×2): qty 2

## 2024-04-04 MED ORDER — PANTOPRAZOLE SODIUM 40 MG IV SOLR
40.0000 mg | Freq: Two times a day (BID) | INTRAVENOUS | Status: AC
  Administered 2024-04-04 (×2): 40 mg via INTRAVENOUS
  Filled 2024-04-04 (×2): qty 10

## 2024-04-04 MED ORDER — VITAMIN B-12 1000 MCG PO TABS
500.0000 ug | ORAL_TABLET | Freq: Every day | ORAL | Status: DC
  Administered 2024-04-05: 500 ug via ORAL
  Filled 2024-04-04: qty 1

## 2024-04-04 MED ORDER — DIAZEPAM 5 MG/ML IJ SOLN
10.0000 mg | Freq: Once | INTRAMUSCULAR | Status: AC
  Administered 2024-04-04: 10 mg via INTRAMUSCULAR

## 2024-04-04 MED ORDER — POTASSIUM PHOSPHATES 15 MMOLE/5ML IV SOLN
30.0000 mmol | Freq: Once | INTRAVENOUS | Status: AC
  Administered 2024-04-04: 30 mmol via INTRAVENOUS
  Filled 2024-04-04: qty 10

## 2024-04-04 MED ORDER — ENOXAPARIN SODIUM 40 MG/0.4ML IJ SOSY
40.0000 mg | PREFILLED_SYRINGE | Freq: Every evening | INTRAMUSCULAR | Status: DC
  Administered 2024-04-04: 40 mg via SUBCUTANEOUS
  Filled 2024-04-04: qty 0.4

## 2024-04-04 MED ORDER — DIAZEPAM 5 MG/ML IJ SOLN
10.0000 mg | Freq: Once | INTRAMUSCULAR | Status: DC
  Filled 2024-04-04: qty 2

## 2024-04-04 NOTE — Progress Notes (Signed)
 [facility] Progress Note  Patient: Jose Mays    FMW:[id number]  DOA: 04/03/2024     Date of Service: the patient was seen and examined on 04/04/2024  Chief Complaint  Patient presents with   Emesis   Brief hospital course: Jose Mays is a 28 year old male with history of gastroparesis, cannabinoid hyperemesis syndrome, who presents to the emergency department for chief concerns of nausea and vomiting.   Vitals in the ED showed t of 97.7, rr 20, heart rate 73, blood pressure 128/75, SpO2 95% on room air.   Serum sodium is 139, potassium 3.3, chloride 102, bicarb 24, BUN 11, serum creatinine 0.90, eGFR greater than 60, nonfasting glucose 110, WBC 7.2, hemoglobin 15.1, platelets of 431.   ED treatment: Droperidol  2.5 mg IV one-time dose, ondansetron  4 mg IV one-time dose, Phenergan  12.5 mg IV one-time dose, sodium chloride  2 L bolus.   Assessment and Plan:  #  Intractable nausea and vomiting due to marijuana use Negative COVID/influenza A/influenza B/RSV PCR for gastroenteritis Started full liquid diet Continue LR infusion at 125 mL/h Symptomatic support: Haldol  5 mg IV every 4 hours as needed for nausea and vomiting, 15 hours of coverage ordered    # Essential hypertension Secondary to intractable nausea and vomiting Hydralazine  5 mg IV every 6 hours as needed for SBP greater 170, 5 days ordered   Hypophosphatemia secondary to nutritional deficiency Phos repleted. Monitor electrolytes daily and replete as needed.  Vitamin D  insufficiency, started vitamin D  50,000 units p.o. weekly.  Follow-up with PCP to repeat vitamin D  level after 3 to 6 months  B12 level 343, goal 400.  Started B12 supplement to prevent deficiency.  Follow with PCP to repeat B12 level after 3 to 6 months  Tobacco abuse As needed nicotine  patch  Generalized weakness and fatigue could be secondary to low vitamin D  and B12 level.  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  Interventions:  Diet: FLD DVT Prophylaxis: Subcutaneous Lovenox    Advance goals of care discussion: Full code  Family Communication: family was not present at bedside, at the time of interview.  The pt provided permission to discuss medical plan with the family. Opportunity was given to ask question and all questions were answered satisfactorily.   Disposition:  Pt is from home, admitted with intractable nausea and vomiting due to [facility] abuse, still has N/V, on FLD, unable to tolerate and on IV fluids, which precludes a safe discharge. Discharge to home, when stable, most likely in 1 to 2 days.  Subjective: No significant events overnight.  Patient still having significant nausea and vomiting unable to keep anything down.  Started full liquid diet and continued IV fluid. Patient denied any other symptoms.  Physical Exam: General: NAD, lying comfortably Appear in no distress, affect appropriate Eyes: PERRLA ENT: Oral Mucosa Clear, moist  Neck: no JVD,  Cardiovascular: S1 and S2 Present, no Murmur,  Respiratory: good respiratory effort, Bilateral Air entry equal and Decreased, no Crackles, no wheezes Abdomen: Bowel Sound present, Soft and no tenderness,  Skin: no rashes Extremities: no Pedal edema, no calf tenderness Neurologic: without any new focal findings Gait not checked due to patient safety concerns  Vitals:   04/03/24 2128 04/04/24 0529 04/04/24 0812 04/04/24 1228  BP: 125/65 115/60 120/64 (!) 106/55  Pulse: (!) 52 (!) 49 (!) 49 (!) 51  Resp: 15 16 (!)  21 20  Temp: 98.5 F (36.9 C) 98.2 F (36.8 C) 97.8 F (36.6 C) 98.1 F (36.7 C)  TempSrc: Oral Oral Oral   SpO2: 100% 100% 100% 100%  Weight:      Height:        Intake/Output Summary (Last 24 hours) at 04/04/2024 1444 Last data filed at 04/04/2024 0900 Gross per 24 hour  Intake 2581.02 ml  Output --  Net 2581.02  ml   Filed Weights   04/03/24 1207  Weight: 79.4 kg    Data Reviewed: I have personally reviewed and interpreted daily labs, tele strips, imagings as discussed above. I reviewed all nursing notes, pharmacy notes, vitals, pertinent old records I have discussed plan of care as described above with RN and patient/family.  CBC: Recent Labs  Lab 04/03/24 1219 04/04/24 0553  WBC 7.2 9.5  HGB 15.1 14.5  HCT 46.2 44.2  MCV 95.3 95.9  PLT 431* 398   Basic Metabolic Panel: Recent Labs  Lab 04/03/24 1219 04/04/24 0553  NA 139 141  K 3.3* 3.2*  CL 102 103  CO2 24 28  GLUCOSE 110* 90  BUN 11 10  CREATININE 0.90 0.86  CALCIUM 9.3 8.8*  MG 2.2 1.9  PHOS  --  2.3*    Studies: No results found.  Scheduled Meds:  heparin   5,000 Units Subcutaneous Q8H   pantoprazole  (PROTONIX ) IV  40 mg Intravenous BID   Followed by   NOREEN ON 04/05/2024] pantoprazole   40 mg Oral BID   Continuous Infusions:  lactated ringers  125 mL/hr at 04/04/24 1021   potassium PHOSPHATE  IVPB (in mmol) 30 mmol (04/04/24 1420)   PRN Meds: acetaminophen  **OR** acetaminophen , hydrALAZINE , nicotine , ondansetron  **OR** ondansetron  (ZOFRAN ) IV, senna-docusate  Time spent: 55 minutes  Author: ELVAN SOR. MD [facility] 04/04/2024 2:44 PM  To reach On-call, see care teams to locate the attending and reach out to them via [email or web address]. If 7PM-7AM, please contact night-coverage If you still have difficulty reaching the attending provider, please page the [facility] (Director on Call) for [facility] on amion for assistance.

## 2024-04-04 NOTE — Plan of Care (Signed)
  Problem: Education: Goal: Knowledge of General Education information will improve Description: Including pain rating scale, medication(s)/side effects and non-pharmacologic comfort measures Outcome: Progressing   Problem: Health Behavior/Discharge Planning: Goal: Ability to manage health-related needs will improve Outcome: Progressing   Problem: Clinical Measurements: Goal: Ability to maintain clinical measurements within normal limits will improve Outcome: Progressing Goal: Will remain free from infection Outcome: Progressing   Problem: Activity: Goal: Risk for activity intolerance will decrease Outcome: Progressing   Problem: Safety: Goal: Ability to remain free from injury will improve Outcome: Progressing

## 2024-04-04 NOTE — Progress Notes (Signed)
 IV obtained by staff RN.

## 2024-04-04 NOTE — Plan of Care (Signed)
   Problem: Education: Goal: Knowledge of General Education information will improve Description Including pain rating scale, medication(s)/side effects and non-pharmacologic comfort measures Outcome: Progressing

## 2024-04-04 NOTE — Progress Notes (Signed)
 Patient back to the unit safely.  Jose Mays V Karron Alvizo

## 2024-04-04 NOTE — Progress Notes (Signed)
 Patient requested to go off the unit to the lobby to get items from his car. Educated that he can not leave the unit without being accompanied by a nurse. Patient left the unit with family, pushing baby in stroller. Security notify patient is off the unit, security stated they will round on unit.  Jose Mays

## 2024-04-04 NOTE — Plan of Care (Signed)
  Problem: Education: Goal: Knowledge of General Education information will improve Description: Including pain rating scale, medication(s)/side effects and non-pharmacologic comfort measures Outcome: Progressing   Problem: Health Behavior/Discharge Planning: Goal: Ability to manage health-related needs will improve Outcome: Progressing   Problem: Clinical Measurements: Goal: Ability to maintain clinical measurements within normal limits will improve Outcome: Progressing Goal: Will remain free from infection Outcome: Progressing   Problem: Elimination: Goal: Will not experience complications related to bowel motility Outcome: Progressing   Problem: Pain Managment: Goal: General experience of comfort will improve and/or be controlled Outcome: Progressing

## 2024-04-05 ENCOUNTER — Other Ambulatory Visit: Payer: Self-pay

## 2024-04-05 LAB — BASIC METABOLIC PANEL WITH GFR
Anion gap: 8 (ref 5–15)
BUN: 10 mg/dL (ref 6–20)
CO2: 28 mmol/L (ref 22–32)
Calcium: 8.3 mg/dL — ABNORMAL LOW (ref 8.9–10.3)
Chloride: 105 mmol/L (ref 98–111)
Creatinine, Ser: 0.94 mg/dL (ref 0.61–1.24)
GFR, Estimated: >60 mL/min (ref >60)
Glucose, Bld: 84 mg/dL (ref 70–99)
Potassium: 3.3 mmol/L — ABNORMAL LOW (ref 3.5–5.1)
Sodium: 141 mmol/L (ref 135–145)

## 2024-04-05 LAB — CBC
HCT: 41.4 % (ref 39.0–52.0)
Hemoglobin: 13.5 g/dL (ref 13.0–17.0)
MCH: 31.4 pg (ref 26.0–34.0)
MCHC: 32.6 g/dL (ref 30.0–36.0)
MCV: 96.3 fL (ref 80.0–100.0)
Platelets: 357 K/uL (ref 150–400)
RBC: 4.3 MIL/uL (ref 4.22–5.81)
RDW: 12.3 % (ref 11.5–15.5)
WBC: 9 K/uL (ref 4.0–10.5)
nRBC: 0 % (ref 0.0–0.2)

## 2024-04-05 LAB — PHOSPHORUS: Phosphorus: 3.9 mg/dL (ref 2.5–4.6)

## 2024-04-05 LAB — MAGNESIUM: Magnesium: 2.1 mg/dL (ref 1.7–2.4)

## 2024-04-05 MED ORDER — POTASSIUM CHLORIDE CRYS ER 20 MEQ PO TBCR
40.0000 meq | EXTENDED_RELEASE_TABLET | Freq: Once | ORAL | Status: AC
  Administered 2024-04-05: 40 meq via ORAL
  Filled 2024-04-05: qty 2

## 2024-04-05 MED ORDER — PANTOPRAZOLE SODIUM 40 MG PO TBEC
40.0000 mg | DELAYED_RELEASE_TABLET | Freq: Every day | ORAL | 0 refills | Status: AC
  Filled 2024-04-05: qty 14, 14d supply, fill #0

## 2024-04-05 MED ORDER — VITAMIN D (ERGOCALCIFEROL) 1.25 MG (50000 UNIT) PO CAPS
50000.0000 [IU] | ORAL_CAPSULE | ORAL | 2 refills | Status: AC
Start: 2024-04-12 — End: 2024-07-11
  Filled 2024-04-05: qty 4, 28d supply, fill #0

## 2024-04-05 MED ORDER — CYANOCOBALAMIN 500 MCG PO TABS
1000.0000 ug | ORAL_TABLET | Freq: Every day | ORAL | 2 refills | Status: AC
  Filled 2024-04-05: qty 60, 30d supply, fill #0

## 2024-04-05 NOTE — Discharge Summary (Signed)
 [facility] Discharge Summary   Patient: Jose Mays FMW:[id number]  PCP: Pcp, No  Date of admission: 04/03/2024   Date of discharge:  04/05/2024     Discharge Diagnoses:  Principal Problem:   Intractable nausea and vomiting Active Problems:   Cannabinoid hyperemesis syndrome   Tobacco abuse   Gastroparesis   Essential hypertension   Diaphoresis   Admitted From: Home Disposition: Home  Recommendations for Outpatient Follow-up:  PCP: In 1 week Follow up LABS/TEST:     Follow-up Information     PCP Follow up in 1 week(s).                 Diet recommendation: Cardiac diet  Activity: The patient is advised to gradually reintroduce usual activities, as tolerated  Discharge Condition: stable  Code Status: Full code   History of present illness: As per the H and P dictated on admission. Hospital Course:  Mr. Worthington Cruzan is a 28 year old male with history of gastroparesis, cannabinoid hyperemesis syndrome, who presents to the emergency department for chief concerns of nausea and vomiting.   Vitals in the ED showed t of 97.7, rr 20, heart rate 73, blood pressure 128/75, SpO2 95% on room air.   Serum sodium is 139, potassium 3.3, chloride 102, bicarb 24, BUN 11, serum creatinine 0.90, eGFR greater than 60, nonfasting glucose 110, WBC 7.2, hemoglobin 15.1, platelets of 431.   ED treatment: Droperidol  2.5 mg IV one-time dose, ondansetron  4 mg IV one-time dose, Phenergan  12.5 mg IV one-time dose, sodium chloride  2 L bolus.     Assessment and Plan:   #  Intractable nausea and vomiting due to marijuana use Negative COVID/influenza A/influenza B/RSV PCR for gastroenteritis S/p IV fluid and symptomatic treatment given for nausea and vomiting.  Patient was started on clear liquid diet, gradually advanced to full liquid and then soft diet.  Nausea and vomiting resolved, patient is tolerating diet well.  Patient agreed with the discharge planning.  #  Essential hypertension Secondary to intractable nausea and vomiting.  Blood pressure was monitored closely and it remained within normal range, patient does not need any antiplatelet medication at discharge.  Recommended to monitor BP at home and follow with PCP.   # Hypophosphatemia secondary to nutritional deficiency Phos repleted.  Resolved   # Vitamin D  insufficiency, started vitamin D  50,000 units p.o. weekly.  Follow-up with PCP to repeat vitamin D  level after 3 to 6 months   # B12 level 343, goal 400.  Started B12 supplement to prevent deficiency.  Follow with PCP to repeat B12 level after 3 to 6 months   # Tobacco abuse: As needed nicotine  patch   # Generalized weakness and fatigue could be secondary to low vitamin D  and B12 level.  Continue supplements as appropriate   # Marijuana use disorder: UDS positive for THC Drug abuse abstinence counseling   Body mass index is 29.12 kg/m.  Nutrition Interventions:  - Patient was instructed, not to drive, operate heavy machinery, perform activities at heights, swimming or participation in water activities or provide baby sitting services while on Pain, Sleep and Anxiety Medications; until his outpatient Physician has advised to do so again.  - Also recommended to not to take more than prescribed Pain, Sleep and Anxiety Medications.  Patient was ambulatory without any assistance. On the day of the discharge the patient's vitals were stable, and no other acute medical condition were reported by patient. the patient was felt safe to be discharge at  Home.  Consultants: None Procedures: None  Discharge Exam: General: Appear in no distress, no Rash; Oral Mucosa Clear, moist. Cardiovascular: S1 and S2 Present, no Murmur, Respiratory: normal respiratory effort, Bilateral Air entry present and no Crackles, no wheezes Abdomen: Bowel Sound present, Soft and no tenderness. Extremities: no Pedal edema, no calf tenderness Neurology: alert and  oriented to time, place, and person affect appropriate.  Filed Weights   04/03/24 1207  Weight: 79.4 kg   Vitals:   04/05/24 0242 04/05/24 0900  BP: 115/76 124/78  Pulse:  81  Resp: 18   Temp: 97.8 F (36.6 C) 97.9 F (36.6 C)  SpO2: 100% 100%    DISCHARGE MEDICATION: Allergies as of 04/05/2024   No Known Allergies      Medication List     STOP taking these medications    ibuprofen  200 MG tablet Commonly known as: ADVIL    sulfamethoxazole -trimethoprim  800-160 MG tablet Commonly known as: BACTRIM  DS       TAKE these medications    acetaminophen  500 MG tablet Commonly known as: TYLENOL  Take 1,000 mg by mouth every 6 (six) hours as needed for moderate pain (pain score 4-6), fever or headache.   pantoprazole  40 MG tablet Commonly known as: PROTONIX  Take 1 tablet (40 mg total) by mouth daily for 14 days.   vitamin B-12 500 MCG tablet Commonly known as: CYANOCOBALAMIN  Take 2 tablets (1,000 mcg total) by mouth daily. Start taking on: April 06, 2024   Vitamin D  (Ergocalciferol ) 1.25 MG (50000 UNIT) Caps capsule Commonly known as: DRISDOL  Take 1 capsule (50,000 Units total) by mouth every 7 (seven) days. Start taking on: April 12, 2024       No Known Allergies Discharge Instructions     Call MD for:  difficulty breathing, headache or visual disturbances   Complete by: As directed    Call MD for:  extreme fatigue   Complete by: As directed    Call MD for:  persistant dizziness or light-headedness   Complete by: As directed    Call MD for:  persistant nausea and vomiting   Complete by: As directed    Call MD for:  severe uncontrolled pain   Complete by: As directed    Call MD for:  temperature >100.4   Complete by: As directed    Diet general   Complete by: As directed    Discharge instructions   Complete by: As directed    F/u with PCP in 1 wk   Increase activity slowly   Complete by: As directed        The results of significant  diagnostics from this hospitalization (including imaging, microbiology, ancillary and laboratory) are listed below for reference.    Significant Diagnostic Studies: DG Chest Portable 1 View Result Date: 04/03/2024 CLINICAL DATA:  Vomiting. EXAM: PORTABLE CHEST 1 VIEW COMPARISON:  Dec 13, 2023. FINDINGS: The heart size and mediastinal contours are within normal limits. Both lungs are clear. The visualized skeletal structures are unremarkable. IMPRESSION: No active disease. Electronically Signed   By: Lynwood Landy Raddle M.D.   On: 04/03/2024 12:57    Microbiology: Recent Results (from the past 240 hours)  Resp panel by RT-PCR (RSV, Flu A&B, Covid) Urine, Clean Catch     Status: None   Collection Time: 04/03/24  6:18 PM   Specimen: Urine, Clean Catch; Nasal Swab  Result Value Ref Range Status   SARS Coronavirus 2 by RT PCR NEGATIVE NEGATIVE Final    Comment: (NOTE)  SARS-CoV-2 target nucleic acids are NOT DETECTED.  The SARS-CoV-2 RNA is generally detectable in upper respiratory specimens during the acute phase of infection. The lowest concentration of SARS-CoV-2 viral copies this assay can detect is 138 copies/mL. A negative result does not preclude SARS-Cov-2 infection and should not be used as the sole basis for treatment or other patient management decisions. A negative result may occur with  improper specimen collection/handling, submission of specimen other than nasopharyngeal swab, presence of viral mutation(s) within the areas targeted by this assay, and inadequate number of viral copies(<138 copies/mL). A negative result must be combined with clinical observations, patient history, and epidemiological information. The expected result is Negative.  Fact Sheet for Patients:  BloggerCourse.com  Fact Sheet for Healthcare Providers:  SeriousBroker.it  This test is no t yet approved or cleared by the [place]  FDA and  has been  authorized for detection and/or diagnosis of SARS-CoV-2 by FDA under an Emergency Use Authorization (EUA). This EUA will remain  in effect (meaning this test can be used) for the duration of the COVID-19 declaration under Section 564(b)(1) of the Act, 21 U.S.C.section 360bbb-3(b)(1), unless the authorization is terminated  or revoked sooner.       Influenza A by PCR NEGATIVE NEGATIVE Final   Influenza B by PCR NEGATIVE NEGATIVE Final    Comment: (NOTE) The Xpert Xpress SARS-CoV-2/FLU/RSV plus assay is intended as an aid in the diagnosis of influenza from Nasopharyngeal swab specimens and should not be used as a sole basis for treatment. Nasal washings and aspirates are unacceptable for Xpert Xpress SARS-CoV-2/FLU/RSV testing.  Fact Sheet for Patients: BloggerCourse.com  Fact Sheet for Healthcare Providers: SeriousBroker.it  This test is not yet approved or cleared by the [place]  FDA and has been authorized for detection and/or diagnosis of SARS-CoV-2 by FDA under an Emergency Use Authorization (EUA). This EUA will remain in effect (meaning this test can be used) for the duration of the COVID-19 declaration under Section 564(b)(1) of the Act, 21 U.S.C. section 360bbb-3(b)(1), unless the authorization is terminated or revoked.     Resp Syncytial Virus by PCR NEGATIVE NEGATIVE Final    Comment: (NOTE) Fact Sheet for Patients: BloggerCourse.com  Fact Sheet for Healthcare Providers: SeriousBroker.it  This test is not yet approved or cleared by the [place]  FDA and has been authorized for detection and/or diagnosis of SARS-CoV-2 by FDA under an Emergency Use Authorization (EUA). This EUA will remain in effect (meaning this test can be used) for the duration of the COVID-19 declaration under Section 564(b)(1) of the Act, 21 U.S.C. section 360bbb-3(b)(1), unless the  authorization is terminated or revoked.  Performed at [facility], [address] [place], [place] [address]      Labs: CBC: Recent Labs  Lab 04/03/24 1219 04/04/24 0553 04/05/24 0336  WBC 7.2 9.5 9.0  HGB 15.1 14.5 13.5  HCT 46.2 44.2 41.4  MCV 95.3 95.9 96.3  PLT 431* 398 357   Basic Metabolic Panel: Recent Labs  Lab 04/03/24 1219 04/04/24 0553 04/05/24 0336  NA 139 141 141  K 3.3* 3.2* 3.3*  CL 102 103 105  CO2 24 28 28   GLUCOSE 110* 90 84  BUN 11 10 10   CREATININE 0.90 0.86 0.94  CALCIUM 9.3 8.8* 8.3*  MG 2.2 1.9 2.1  PHOS  --  2.3* 3.9   Liver Function Tests: Recent Labs  Lab 04/03/24 1219  AST 22  ALT 13  ALKPHOS 75  BILITOT 1.2  PROT 8.8*  ALBUMIN 4.5   Recent Labs  Lab 04/03/24 1219  LIPASE 27   No results for input(s): AMMONIA in the last 168 hours. Cardiac Enzymes: No results for input(s): CKTOTAL, CKMB, CKMBINDEX, TROPONINI in the last 168 hours. BNP (last 3 results) No results for input(s): BNP in the last 8760 hours. CBG: No results for input(s): GLUCAP in the last 168 hours.  Time spent: 35 minutes  Signed:  Elvan Sor  [facility] 04/05/2024 10:54 AM
